# Patient Record
Sex: Female | Born: 1945
Health system: Southern US, Community
[De-identification: ages and names within clinical notes are randomized; demographics above are authoritative.]

## PROBLEM LIST (undated history)

## (undated) DIAGNOSIS — G4730 Sleep apnea, unspecified: Secondary | ICD-10-CM

## (undated) DIAGNOSIS — M1990 Unspecified osteoarthritis, unspecified site: Secondary | ICD-10-CM

## (undated) DIAGNOSIS — K2270 Barrett's esophagus without dysplasia: Secondary | ICD-10-CM

## (undated) DIAGNOSIS — M779 Enthesopathy, unspecified: Secondary | ICD-10-CM

## (undated) DIAGNOSIS — E7800 Pure hypercholesterolemia, unspecified: Secondary | ICD-10-CM

## (undated) DIAGNOSIS — N951 Menopausal and female climacteric states: Secondary | ICD-10-CM

## (undated) DIAGNOSIS — I10 Essential (primary) hypertension: Secondary | ICD-10-CM

## (undated) DIAGNOSIS — K219 Gastro-esophageal reflux disease without esophagitis: Secondary | ICD-10-CM

## (undated) DIAGNOSIS — E119 Type 2 diabetes mellitus without complications: Secondary | ICD-10-CM

## (undated) DIAGNOSIS — M8580 Other specified disorders of bone density and structure, unspecified site: Secondary | ICD-10-CM

## (undated) DIAGNOSIS — K5790 Diverticulosis of intestine, part unspecified, without perforation or abscess without bleeding: Secondary | ICD-10-CM

## (undated) DIAGNOSIS — E039 Hypothyroidism, unspecified: Secondary | ICD-10-CM

## (undated) HISTORY — PX: BREAST EXCISIONAL BIOPSY: SUR124

## (undated) HISTORY — PX: EYE SURGERY: SHX253

## (undated) HISTORY — DX: Essential (primary) hypertension: I10

## (undated) HISTORY — DX: Gastro-esophageal reflux disease without esophagitis: K21.9

## (undated) HISTORY — PX: OTHER SURGICAL HISTORY: SHX169

## (undated) HISTORY — PX: BREAST SURGERY: SHX581

## (undated) HISTORY — DX: Menopausal and female climacteric states: N95.1

## (undated) HISTORY — DX: Diverticulosis of intestine, part unspecified, without perforation or abscess without bleeding: K57.90

## (undated) HISTORY — PX: CATARACT EXTRACTION: SUR2

## (undated) HISTORY — DX: Sleep apnea, unspecified: G47.30

## (undated) HISTORY — DX: Other specified disorders of bone density and structure, unspecified site: M85.80

## (undated) HISTORY — DX: Enthesopathy, unspecified: M77.9

## (undated) HISTORY — PX: TONSILLECTOMY: SUR1361

## (undated) HISTORY — DX: Hypothyroidism, unspecified: E03.9

---

## 1981-07-06 HISTORY — PX: ABDOMINAL HYSTERECTOMY: SHX81

## 1992-07-06 HISTORY — PX: BREAST SURGERY: SHX581

## 2000-09-07 ENCOUNTER — Ambulatory Visit (HOSPITAL_BASED_OUTPATIENT_CLINIC_OR_DEPARTMENT_OTHER): Admission: RE | Admit: 2000-09-07 | Discharge: 2000-09-07 | Payer: Self-pay | Admitting: Orthopedic Surgery

## 2000-09-07 ENCOUNTER — Encounter (INDEPENDENT_AMBULATORY_CARE_PROVIDER_SITE_OTHER): Payer: Self-pay | Admitting: *Deleted

## 2001-10-19 ENCOUNTER — Encounter: Payer: Self-pay | Admitting: Obstetrics & Gynecology

## 2001-10-19 ENCOUNTER — Encounter: Admission: RE | Admit: 2001-10-19 | Discharge: 2001-10-19 | Payer: Self-pay | Admitting: Obstetrics & Gynecology

## 2002-11-28 ENCOUNTER — Encounter: Payer: Self-pay | Admitting: Obstetrics & Gynecology

## 2002-11-28 ENCOUNTER — Encounter: Admission: RE | Admit: 2002-11-28 | Discharge: 2002-11-28 | Payer: Self-pay | Admitting: Obstetrics & Gynecology

## 2003-12-05 ENCOUNTER — Encounter: Admission: RE | Admit: 2003-12-05 | Discharge: 2003-12-05 | Payer: Self-pay | Admitting: Obstetrics & Gynecology

## 2004-08-13 ENCOUNTER — Ambulatory Visit: Payer: Self-pay | Admitting: Family Medicine

## 2004-10-13 ENCOUNTER — Ambulatory Visit: Payer: Self-pay | Admitting: Family Medicine

## 2004-10-21 ENCOUNTER — Ambulatory Visit: Payer: Self-pay | Admitting: Family Medicine

## 2005-01-22 ENCOUNTER — Ambulatory Visit: Payer: Self-pay | Admitting: Family Medicine

## 2005-02-06 ENCOUNTER — Ambulatory Visit: Payer: Self-pay | Admitting: Family Medicine

## 2005-04-06 ENCOUNTER — Encounter: Admission: RE | Admit: 2005-04-06 | Discharge: 2005-04-06 | Payer: Self-pay | Admitting: Obstetrics & Gynecology

## 2005-04-16 ENCOUNTER — Encounter: Admission: RE | Admit: 2005-04-16 | Discharge: 2005-04-16 | Payer: Self-pay | Admitting: Obstetrics & Gynecology

## 2005-07-06 DIAGNOSIS — G4730 Sleep apnea, unspecified: Secondary | ICD-10-CM

## 2005-07-06 HISTORY — DX: Sleep apnea, unspecified: G47.30

## 2005-08-27 ENCOUNTER — Ambulatory Visit: Payer: Self-pay | Admitting: Family Medicine

## 2005-09-15 ENCOUNTER — Ambulatory Visit: Payer: Self-pay | Admitting: Family Medicine

## 2005-10-08 ENCOUNTER — Encounter: Admission: RE | Admit: 2005-10-08 | Discharge: 2005-10-08 | Payer: Self-pay | Admitting: Obstetrics & Gynecology

## 2005-12-23 ENCOUNTER — Ambulatory Visit: Payer: Self-pay | Admitting: Family Medicine

## 2006-01-18 ENCOUNTER — Ambulatory Visit (HOSPITAL_BASED_OUTPATIENT_CLINIC_OR_DEPARTMENT_OTHER): Admission: RE | Admit: 2006-01-18 | Discharge: 2006-01-18 | Payer: Self-pay | Admitting: Family Medicine

## 2006-01-29 ENCOUNTER — Ambulatory Visit: Payer: Self-pay | Admitting: Pulmonary Disease

## 2006-03-23 ENCOUNTER — Ambulatory Visit: Payer: Self-pay | Admitting: Pulmonary Disease

## 2006-04-07 ENCOUNTER — Encounter: Admission: RE | Admit: 2006-04-07 | Discharge: 2006-04-07 | Payer: Self-pay | Admitting: Obstetrics & Gynecology

## 2006-04-13 ENCOUNTER — Ambulatory Visit: Payer: Self-pay | Admitting: Family Medicine

## 2006-07-01 ENCOUNTER — Ambulatory Visit: Payer: Self-pay | Admitting: Family Medicine

## 2006-07-27 ENCOUNTER — Ambulatory Visit: Payer: Self-pay | Admitting: Family Medicine

## 2006-10-05 ENCOUNTER — Ambulatory Visit: Payer: Self-pay | Admitting: Family Medicine

## 2006-10-29 ENCOUNTER — Encounter: Payer: Self-pay | Admitting: Family Medicine

## 2006-10-29 DIAGNOSIS — I10 Essential (primary) hypertension: Secondary | ICD-10-CM | POA: Insufficient documentation

## 2006-10-29 DIAGNOSIS — E063 Autoimmune thyroiditis: Secondary | ICD-10-CM

## 2006-10-29 DIAGNOSIS — E038 Other specified hypothyroidism: Secondary | ICD-10-CM

## 2006-10-29 DIAGNOSIS — K219 Gastro-esophageal reflux disease without esophagitis: Secondary | ICD-10-CM

## 2006-10-29 DIAGNOSIS — G4730 Sleep apnea, unspecified: Secondary | ICD-10-CM | POA: Insufficient documentation

## 2006-10-30 ENCOUNTER — Encounter: Payer: Self-pay | Admitting: Family Medicine

## 2006-11-08 ENCOUNTER — Telehealth: Payer: Self-pay | Admitting: Family Medicine

## 2006-12-03 ENCOUNTER — Ambulatory Visit: Payer: Self-pay | Admitting: Family Medicine

## 2006-12-06 ENCOUNTER — Telehealth: Payer: Self-pay | Admitting: Family Medicine

## 2006-12-08 LAB — CONVERTED CEMR LAB
BUN: 15 mg/dL (ref 6–23)
CO2: 25 meq/L (ref 19–32)
Calcium: 9.7 mg/dL (ref 8.4–10.5)
Chloride: 102 meq/L (ref 96–112)
Creatinine, Ser: 0.87 mg/dL (ref 0.40–1.20)
Glucose, Bld: 83 mg/dL (ref 70–99)
Potassium: 3.8 meq/L (ref 3.5–5.3)
Sodium: 140 meq/L (ref 135–145)

## 2006-12-14 ENCOUNTER — Telehealth (INDEPENDENT_AMBULATORY_CARE_PROVIDER_SITE_OTHER): Payer: Self-pay | Admitting: *Deleted

## 2006-12-17 ENCOUNTER — Ambulatory Visit: Payer: Self-pay | Admitting: Family Medicine

## 2006-12-20 ENCOUNTER — Telehealth: Payer: Self-pay | Admitting: Family Medicine

## 2006-12-25 ENCOUNTER — Encounter: Payer: Self-pay | Admitting: Family Medicine

## 2007-04-19 ENCOUNTER — Encounter: Admission: RE | Admit: 2007-04-19 | Discharge: 2007-04-19 | Payer: Self-pay | Admitting: Obstetrics & Gynecology

## 2007-05-31 ENCOUNTER — Encounter: Payer: Self-pay | Admitting: Family Medicine

## 2007-06-17 ENCOUNTER — Encounter: Payer: Self-pay | Admitting: Family Medicine

## 2007-06-17 ENCOUNTER — Encounter (INDEPENDENT_AMBULATORY_CARE_PROVIDER_SITE_OTHER): Payer: Self-pay | Admitting: Orthopedic Surgery

## 2007-06-17 ENCOUNTER — Ambulatory Visit (HOSPITAL_BASED_OUTPATIENT_CLINIC_OR_DEPARTMENT_OTHER): Admission: RE | Admit: 2007-06-17 | Discharge: 2007-06-17 | Payer: Self-pay | Admitting: Orthopedic Surgery

## 2007-08-05 ENCOUNTER — Ambulatory Visit: Payer: Self-pay | Admitting: Family Medicine

## 2007-08-08 LAB — CONVERTED CEMR LAB
ALT: 21 units/L (ref 0–35)
AST: 23 units/L (ref 0–37)
Basophils Relative: 0 % (ref 0.0–1.0)
Bilirubin, Direct: 0.1 mg/dL (ref 0.0–0.3)
CO2: 31 meq/L (ref 19–32)
Calcium: 9.6 mg/dL (ref 8.4–10.5)
Creatinine, Ser: 0.9 mg/dL (ref 0.4–1.2)
Eosinophils Relative: 2 % (ref 0.0–5.0)
GFR calc Af Amer: 82 mL/min
Glucose, Bld: 89 mg/dL (ref 70–99)
Hemoglobin: 13.1 g/dL (ref 12.0–15.0)
Lymphocytes Relative: 40.7 % (ref 12.0–46.0)
Neutro Abs: 3.6 10*3/uL (ref 1.4–7.7)
Platelets: 283 10*3/uL (ref 150–400)
Total Bilirubin: 0.7 mg/dL (ref 0.3–1.2)
Total Protein: 6.9 g/dL (ref 6.0–8.3)
Triglycerides: 113 mg/dL (ref 0–149)
VLDL: 23 mg/dL (ref 0–40)
WBC: 7.6 10*3/uL (ref 4.5–10.5)

## 2007-10-25 ENCOUNTER — Telehealth: Payer: Self-pay | Admitting: Family Medicine

## 2007-11-04 ENCOUNTER — Encounter: Payer: Self-pay | Admitting: Family Medicine

## 2007-12-22 ENCOUNTER — Ambulatory Visit: Payer: Self-pay | Admitting: Family Medicine

## 2007-12-28 ENCOUNTER — Encounter: Payer: Self-pay | Admitting: Family Medicine

## 2008-03-05 ENCOUNTER — Telehealth (INDEPENDENT_AMBULATORY_CARE_PROVIDER_SITE_OTHER): Payer: Self-pay | Admitting: *Deleted

## 2008-03-06 HISTORY — PX: ESOPHAGOGASTRODUODENOSCOPY: SHX1529

## 2008-03-07 ENCOUNTER — Telehealth: Payer: Self-pay | Admitting: Gastroenterology

## 2008-03-07 ENCOUNTER — Ambulatory Visit: Payer: Self-pay | Admitting: Gastroenterology

## 2008-03-19 ENCOUNTER — Ambulatory Visit: Payer: Self-pay | Admitting: Gastroenterology

## 2008-03-19 ENCOUNTER — Encounter: Payer: Self-pay | Admitting: Gastroenterology

## 2008-03-20 ENCOUNTER — Encounter: Payer: Self-pay | Admitting: Gastroenterology

## 2008-04-03 ENCOUNTER — Ambulatory Visit: Payer: Self-pay | Admitting: Family Medicine

## 2008-04-24 ENCOUNTER — Encounter: Admission: RE | Admit: 2008-04-24 | Discharge: 2008-04-24 | Payer: Self-pay | Admitting: Obstetrics & Gynecology

## 2008-05-09 ENCOUNTER — Ambulatory Visit: Payer: Self-pay | Admitting: Gastroenterology

## 2008-06-18 ENCOUNTER — Encounter: Payer: Self-pay | Admitting: Family Medicine

## 2008-08-17 ENCOUNTER — Telehealth: Payer: Self-pay | Admitting: Family Medicine

## 2008-10-08 ENCOUNTER — Ambulatory Visit: Payer: Self-pay | Admitting: Family Medicine

## 2008-10-08 DIAGNOSIS — M8580 Other specified disorders of bone density and structure, unspecified site: Secondary | ICD-10-CM

## 2008-10-10 LAB — CONVERTED CEMR LAB
ALT: 17 units/L (ref 0–35)
AST: 22 units/L (ref 0–37)
Albumin: 4.3 g/dL (ref 3.5–5.2)
Alkaline Phosphatase: 41 units/L (ref 39–117)
BUN: 12 mg/dL (ref 6–23)
Basophils Absolute: 0 10*3/uL (ref 0.0–0.1)
Basophils Relative: 0 % (ref 0.0–3.0)
Bilirubin, Direct: 0.1 mg/dL (ref 0.0–0.3)
CO2: 29 meq/L (ref 19–32)
Calcium: 9.5 mg/dL (ref 8.4–10.5)
Chloride: 102 meq/L (ref 96–112)
Cholesterol: 168 mg/dL (ref 0–200)
Creatinine, Ser: 0.9 mg/dL (ref 0.4–1.2)
Eosinophils Absolute: 0.1 10*3/uL (ref 0.0–0.7)
Eosinophils Relative: 2 % (ref 0.0–5.0)
Free T4: 1.4 ng/dL (ref 0.6–1.6)
GFR calc non Af Amer: 67.24 mL/min (ref >60)
Glucose, Bld: 80 mg/dL (ref 70–99)
HCT: 40.6 % (ref 36.0–46.0)
HDL: 42.8 mg/dL (ref >39.00)
Hemoglobin: 13.9 g/dL (ref 12.0–15.0)
LDL Cholesterol: 112 mg/dL — ABNORMAL HIGH (ref 0–99)
Lymphocytes Relative: 37.7 % (ref 12.0–46.0)
Lymphs Abs: 2.2 10*3/uL (ref 0.7–4.0)
MCHC: 34.3 g/dL (ref 30.0–36.0)
MCV: 85.8 fL (ref 78.0–100.0)
Monocytes Absolute: 0.5 10*3/uL (ref 0.1–1.0)
Monocytes Relative: 8.5 % (ref 3.0–12.0)
Neutro Abs: 3 10*3/uL (ref 1.4–7.7)
Neutrophils Relative %: 51.8 % (ref 43.0–77.0)
Platelets: 266 10*3/uL (ref 150.0–400.0)
Potassium: 4.3 meq/L (ref 3.5–5.1)
RBC: 4.73 M/uL (ref 3.87–5.11)
RDW: 13.5 % (ref 11.5–14.6)
Sodium: 138 meq/L (ref 135–145)
TSH: 0.12 microintl units/mL — ABNORMAL LOW (ref 0.35–5.50)
Total Bilirubin: 0.6 mg/dL (ref 0.3–1.2)
Total CHOL/HDL Ratio: 4
Total Protein: 6.9 g/dL (ref 6.0–8.3)
Triglycerides: 68 mg/dL (ref 0.0–149.0)
VLDL: 13.6 mg/dL (ref 0.0–40.0)
Vit D, 25-Hydroxy: 38 ng/mL (ref 30–89)
WBC: 5.8 10*3/uL (ref 4.5–10.5)

## 2008-10-16 ENCOUNTER — Encounter: Payer: Self-pay | Admitting: Family Medicine

## 2008-10-29 ENCOUNTER — Ambulatory Visit: Payer: Self-pay | Admitting: Family Medicine

## 2008-10-29 DIAGNOSIS — L820 Inflamed seborrheic keratosis: Secondary | ICD-10-CM

## 2008-11-03 LAB — HM DEXA SCAN

## 2009-02-05 ENCOUNTER — Ambulatory Visit: Payer: Self-pay | Admitting: Family Medicine

## 2009-03-08 ENCOUNTER — Telehealth: Payer: Self-pay | Admitting: Family Medicine

## 2009-03-08 ENCOUNTER — Encounter: Payer: Self-pay | Admitting: Family Medicine

## 2009-04-26 ENCOUNTER — Encounter: Admission: RE | Admit: 2009-04-26 | Discharge: 2009-04-26 | Payer: Self-pay | Admitting: Family Medicine

## 2009-04-30 ENCOUNTER — Encounter (INDEPENDENT_AMBULATORY_CARE_PROVIDER_SITE_OTHER): Payer: Self-pay | Admitting: *Deleted

## 2009-06-21 ENCOUNTER — Ambulatory Visit: Payer: Self-pay | Admitting: Family Medicine

## 2009-08-13 ENCOUNTER — Ambulatory Visit: Payer: Self-pay | Admitting: Family Medicine

## 2009-09-11 ENCOUNTER — Ambulatory Visit: Payer: Self-pay | Admitting: Family Medicine

## 2009-11-06 ENCOUNTER — Ambulatory Visit: Payer: Self-pay | Admitting: Family Medicine

## 2009-11-07 ENCOUNTER — Ambulatory Visit: Payer: Self-pay | Admitting: Family Medicine

## 2009-11-07 DIAGNOSIS — L259 Unspecified contact dermatitis, unspecified cause: Secondary | ICD-10-CM | POA: Insufficient documentation

## 2009-11-07 DIAGNOSIS — K6289 Other specified diseases of anus and rectum: Secondary | ICD-10-CM

## 2009-11-07 LAB — CONVERTED CEMR LAB
ALT: 19 units/L (ref 0–35)
AST: 27 units/L (ref 0–37)
Albumin: 3.9 g/dL (ref 3.5–5.2)
Alkaline Phosphatase: 51 units/L (ref 39–117)
Basophils Absolute: 0.1 10*3/uL (ref 0.0–0.1)
Basophils Relative: 0.9 % (ref 0.0–3.0)
Calcium: 8.9 mg/dL (ref 8.4–10.5)
Eosinophils Relative: 2.6 % (ref 0.0–5.0)
GFR calc non Af Amer: 67.01 mL/min (ref >60)
Glucose, Bld: 114 mg/dL — ABNORMAL HIGH (ref 70–99)
HCT: 39.7 % (ref 36.0–46.0)
HDL: 50.9 mg/dL (ref >39.00)
Hemoglobin: 13.3 g/dL (ref 12.0–15.0)
Lymphocytes Relative: 40.9 % (ref 12.0–46.0)
Lymphs Abs: 2.4 10*3/uL (ref 0.7–4.0)
Monocytes Relative: 9.2 % (ref 3.0–12.0)
Neutro Abs: 2.7 10*3/uL (ref 1.4–7.7)
Potassium: 4.2 meq/L (ref 3.5–5.1)
RBC: 4.58 M/uL (ref 3.87–5.11)
RDW: 14.6 % (ref 11.5–14.6)
Sodium: 143 meq/L (ref 135–145)
TSH: 5.07 microintl units/mL (ref 0.35–5.50)
Total CHOL/HDL Ratio: 4
Total Protein: 6.3 g/dL (ref 6.0–8.3)
Triglycerides: 76 mg/dL (ref 0.0–149.0)

## 2009-11-12 ENCOUNTER — Encounter: Admission: RE | Admit: 2009-11-12 | Discharge: 2009-11-12 | Payer: Self-pay | Admitting: Family Medicine

## 2009-11-20 ENCOUNTER — Telehealth: Payer: Self-pay | Admitting: Family Medicine

## 2009-11-22 ENCOUNTER — Ambulatory Visit: Payer: Self-pay | Admitting: Family Medicine

## 2009-12-04 ENCOUNTER — Ambulatory Visit: Payer: Self-pay | Admitting: Gastroenterology

## 2009-12-06 ENCOUNTER — Telehealth: Payer: Self-pay | Admitting: Gastroenterology

## 2010-01-28 ENCOUNTER — Ambulatory Visit: Payer: Self-pay | Admitting: Gastroenterology

## 2010-02-05 ENCOUNTER — Ambulatory Visit: Payer: Self-pay | Admitting: Family Medicine

## 2010-02-05 DIAGNOSIS — E7800 Pure hypercholesterolemia, unspecified: Secondary | ICD-10-CM

## 2010-02-05 LAB — CONVERTED CEMR LAB
ALT: 18 U/L (ref 0–35)
AST: 20 U/L (ref 0–37)
Cholesterol: 196 mg/dL (ref 0–200)
HDL: 49.5 mg/dL (ref >39.00)
LDL Cholesterol: 130 mg/dL — ABNORMAL HIGH (ref 0–99)
Total CHOL/HDL Ratio: 4
Triglycerides: 84 mg/dL (ref 0.0–149.0)
VLDL: 16.8 mg/dL (ref 0.0–40.0)

## 2010-02-10 ENCOUNTER — Ambulatory Visit: Payer: Self-pay | Admitting: Family Medicine

## 2010-02-27 ENCOUNTER — Ambulatory Visit: Payer: Self-pay | Admitting: Pulmonary Disease

## 2010-02-27 DIAGNOSIS — G4733 Obstructive sleep apnea (adult) (pediatric): Secondary | ICD-10-CM | POA: Insufficient documentation

## 2010-02-28 ENCOUNTER — Encounter: Payer: Self-pay | Admitting: Family Medicine

## 2010-04-22 ENCOUNTER — Ambulatory Visit: Payer: Self-pay | Admitting: Internal Medicine

## 2010-04-24 ENCOUNTER — Telehealth: Payer: Self-pay | Admitting: Family Medicine

## 2010-04-29 ENCOUNTER — Encounter: Admission: RE | Admit: 2010-04-29 | Discharge: 2010-04-29 | Payer: Self-pay | Admitting: Family Medicine

## 2010-04-29 LAB — HM MAMMOGRAPHY: HM Mammogram: NORMAL

## 2010-05-02 ENCOUNTER — Encounter: Payer: Self-pay | Admitting: Family Medicine

## 2010-06-20 ENCOUNTER — Ambulatory Visit: Payer: Self-pay | Admitting: Pulmonary Disease

## 2010-08-07 NOTE — Assessment & Plan Note (Signed)
Summary: ? SPIDER BITE   Vital Signs:  Patient profile:   65 year old female Height:      65.25 inches Weight:      178.25 pounds BMI:     29.54 Temp:     98.1 degrees F oral Pulse rate:   64 / minute Pulse rhythm:   regular BP sitting:   112 / 70  (left arm) Cuff size:   regular  Vitals Entered By: Benny Lennert CMA Duncan Dull) (September 11, 2009 3:57 PM) CC: ? spider bite   History of Present Illness: 65 year old female:  L leg abcess, resolving.   the patient had left heart read indurated area on the left posterior aspect of her leg. This started approximately 10 days ago, he was warm to touch originally, and this is now resolved and improved. He continues to be indurated.  She has not had any pus, or be expressed from this area. She questions whether or not this was from a spider bite. She has no known history of any pain at that time when this came on, and there was no puncture wound evident.   ROS: GEN: No acute illnesses, no fevers, chills, sweats, fatigue, weight loss, or URI sx. GI: No n/v/d Pulm: No SOB, cough, wheezing Interactive and getting along well at home.  Otherwise, ROS is as per the HPI.   Allergies (verified): No Known Drug Allergies  Past History:  Past medical, surgical, family and social histories (including risk factors) reviewed, and no changes noted (except as noted below).  Past Medical History: Reviewed history from 02/05/2009 and no changes required. Hypertension Hypothyroidism diverticulosis GERD/esophagitis with ulceration osteopenia  bone spur- L foot - posterior menopausal symptoms /hot flashes  GI-- Dr Christella Hartigan  endo - March Rummage PA- Dr Katrinka Blazing ([facility]) GYN- Dr Arlyce Dice  podiatry [facility]   Past Surgical History: Reviewed history from 10/08/2008 and no changes required. Hysterectomy- partial, endometriosis (1983) Lumpectomy- breast, benign (1970's) Tonsillectomy Breast implants- saline (1994) Nodule removed from  thumb Caesarean section X 2 (1971, 1975) Colonoscopy- diverticulosis (05/2000) colonoscopy - nl with neg bx (9/09) EGD esophagitis with ulcerations --? barretts (9/09) screen [place] for AAA- negative 3/10  carotid doppler screen neg 3/10  heel dexa T score -1.0  Family History: Reviewed history from 10/08/2008 and no changes required. father lung ca mother HTN, colon polyps, chol, AAA, heart problems , migraines  brother migriane  Maunt breast ca No FH of Colon Cancer:  Social History: Reviewed history from 10/08/2008 and no changes required. Never Smoked she is an Public affairs consultant resources at [employer], she has 2 children, she does not drink alcohol, is married helpint care for mother with AAA surgery   Physical Exam  General:  Well-developed,well-nourished,in no acute distress; alert,appropriate and cooperative throughout examination Head:  Normocephalic and atraumatic without obvious abnormalities. No apparent alopecia or balding. Ears:  no external deformities.   Nose:  no external deformity.   Neck:  No deformities, masses, or tenderness noted. Lungs:  Normal respiratory effort, chest expands symmetrically. Lungs are clear to auscultation, no crackles or wheezes. Extremities:  No clubbing, cyanosis, edema, or deformity noted with normal full range of motion of all joints.   Neurologic:  alert & oriented X3 and gait normal.   Skin:  posterior left leg, approximately 3 cm across there is an area of some induration without any fluctuance. It is minimally tender to palpation, and it is not warm. There is no central  fluctuance. Psych:  Cognition and judgment appear intact. Alert and cooperative with normal attention span and concentration. No apparent delusions, illusions, hallucinations   Impression & Recommendations:  Problem # 1:  ABSCESS, LEG (ICD-682.6) I think the patient most likely has a left posterior leg cellulitis, abscess, that is now resolving. I think it  is certainly possible that the patient has encapsulated this, and it certainly has improved over last few days.  I feel like I'm obligated to treat this with some oral antibiotics, and we discussed that this could long-standing have some scar and palpable change in this area in her leg  Her updated medication list for this problem includes:    Doxycycline Hyclate 100 Mg Caps (Doxycycline hyclate) .Marland Kitchen... Take 1 tab twice a day  Complete Medication List: 1)  Synthroid 88 Mcg Tabs (Levothyroxine sodium) .... Take 1 tablet by mouth once a day and 1 1/2 tab once weekly 2)  Hydrochlorothiazide 25 Mg Tabs (Hydrochlorothiazide) .Marland Kitchen.. 1 by mouth once daily 3)  Daily Combo Multivits/calcium Tabs (Multiple vitamins-calcium) .Marland Kitchen.. 1 by mouth once daily 4)  Omeprazole 20 Mg Cpdr (Omeprazole) .... One tab by mouth daily 5)  Doxycycline Hyclate 100 Mg Caps (Doxycycline hyclate) .... Take 1 tab twice a day Prescriptions: DOXYCYCLINE HYCLATE 100 MG CAPS (DOXYCYCLINE HYCLATE) Take 1 tab twice a day  #20 x 0   Entered and Authorized by:   Hannah Beat MD   Signed by:   Hannah Beat MD on 09/11/2009   Method used:   [facility]        [facility]  [address] [facility]       [address]       [place], [place]  [address]       Ph: [phone] or [phone]       Fax: [phone]   RxID:   [id number]   Current Allergies (reviewed today): No known allergies

## 2010-08-07 NOTE — Assessment & Plan Note (Signed)
Summary: rov for osa   Visit Type:  Follow-up Copy to:  [name], M.D. Primary Provider/Referring Provider:  [name], M.D.  CC:  follow up. pt states she uses her cpap everynight x 3 hrs a night. pt states she is having no problems with her machine. Pt states since she has changer her mask she is able to use it more.  pt c/o nasal congestion at night.  History of Present Illness: the pt comes in today for f/u of her osa.  She was started on cpap at the last visit, and is trying to get adapted to the device.  She started out with a full fm, but now has changed to nasal pillows.  She still has issues with sleeping on her side, with dislodging of mask at times.  She is not wearing thru the night, but is willing to keep trying to increase compliance.  She is not able to comment on symptom improvement as of yet.    Current Medications (verified): 1)  Synthroid 88 Mcg Tabs (Levothyroxine Sodium) .... Take 1 Tablet By Mouth Once A Day 2)  Hydrochlorothiazide 25 Mg Tabs (Hydrochlorothiazide) .Marland Kitchen.. 1 By Mouth Once Daily 3)  Daily Combo Multivits/calcium  Tabs (Multiple Vitamins-Calcium) .Marland Kitchen.. 1 By Mouth Once Daily 4)  Omeprazole 20 Mg  Cpdr (Omeprazole) .... One Tab By Mouth Daily 5)  Multivitamins  Tabs (Multiple Vitamin) .... Take One By Mouth Once Daily 6)  Viactive .Marland Kitchen.. 1 By Mouth Three Times A Day  Allergies (verified): No Known Drug Allergies  Past History:  Past medical, surgical, family and social histories (including risk factors) reviewed, and no changes noted (except as noted below).  Past Medical History: Reviewed history from 02/27/2010 and no changes required. Hypertension Hypothyroidism diverticulosis GERD/esophagitis with ulceration osteopenia  bone spur- L foot - posterior menopausal symptoms /hot flashes sleep apnea - AHI 45/hr in 2007.  GI-- Dr Christella Hartigan  endo - March Rummage PA- Dr Katrinka Blazing ([facility]) GYN- Dr Arlyce Dice  podiatry [facility]   Past Surgical  History: Reviewed history from 02/27/2010 and no changes required. Hysterectomy- partial, endometriosis (1983) Lumpectomy- breast, benign (1970's) Tonsillectomy Breast implants- saline (1994) Nodule removed from thumb Colonoscopy- diverticulosis (05/2000) colonoscopy - nl with neg bx (9/09) EGD esophagitis with ulcerations --? barretts (9/09) screen [place] for AAA- negative 3/10  carotid doppler screen neg 3/10  heel dexa T score -1.0  Family History: Reviewed history from 01/28/2010 and no changes required. father lung ca mother HTN, colon polyps, chol, AAA, heart problems , migraines , cholesterol  brother migriane  Maunt breast ca No FH of Colon Cancer  Social History: Reviewed history from 10/08/2008 and no changes required. 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  The patient denies shortness of breath with activity, shortness of breath at rest, productive cough, non-productive cough, coughing up blood, chest pain, irregular heartbeats, loss of appetite, abdominal pain, difficulty swallowing, sore throat, tooth/dental problems, headaches, sneezing, itching, ear ache, anxiety, depression, hand/feet swelling, joint stiffness or pain, rash, change in color of mucus, and fever.    Vital Signs:  Patient profile:   65 year old female Height:      66 inches Weight:      189.13 pounds  BMI:     30.64 O2 Sat:      97 % on Room air Temp:     98.3 degrees F oral Pulse rate:   86 / minute BP sitting:   122 / 76  (left arm) Cuff size:   large  Vitals Entered By: Carver Fila (June 20, 2010 10:18 AM)  O2 Flow:  Room air CC: follow up. pt states she uses her cpap everynight x 3 hrs a night. pt states she is having no problems with  her machine. Pt states since she has changer her mask she is able to use it more.  pt c/o nasal congestion at night Comments meds and allergies updated Phone number updated Carver Fila  June 20, 2010 10:19 AM    Physical Exam  General:  ow female in nad Nose:  no purulence or discharge, no skin breakdown or pressure necrosis from cpap mask Heart:  rrr Extremities:  no edema noted, no cyanosis  Neurologic:  alert, does not appear sleepy, moves all 4.   Impression & Recommendations:  Problem # 1:  OBSTRUCTIVE SLEEP APNEA (ICD-327.23) the pt is trying to wear cpap compliantly, but is having ongoing mask issues.  She is now using nasal pillows, and is doing better.  She still is not getting completely thru the night with the device, but is willing to keep trying.  The jury is still out regarding her sleep quality and daytime alertness.  We still need to optimize pressure for her, and I have also encouraged her to work on weight loss. Care Plan:  At this point, will arrange for the patient's machine to be changed over to auto mode for 2 weeks to optimize their pressure.  I will review the downloaded data once sent by dme, and also evaluate for compliance, leaks, and residual osa.  I will call the patient and dme to discuss the results, and have the patient's machine set appropriately.  This will serve as the pt's cpap pressure titration.  Other Orders: Est. Patient Level IV (09811) DME Referral (DME)  Patient Instructions: 1)  will turn machine to auto mode to optimize your pressure.  Will get download and let you know the results.  Let us know if you prefer the auto setting 2)  work on weight loss 3)  followup up with me in 6mos, but call if having ongoing tolerance issues.

## 2010-08-07 NOTE — Assessment & Plan Note (Signed)
Summary: ? SINUS INFECTION   Vital Signs:  Patient profile:   65 year old female Height:      65.25 inches Weight:      180.13 pounds BMI:     29.85 Temp:     98.3 degrees F oral Pulse rate:   76 / minute Pulse rhythm:   regular BP sitting:   120 / 86  (left arm) Cuff size:   regular  Vitals Entered By: Delilah Shan CMA Duncan Dull) (August 13, 2009 10:47 AM) CC: ? sinus infection   History of Present Illness: 65 yo with 1 week of nasal congestion, cough, hoarseness. Taking Cheratussin as needed at night.   HA is frontal. Subjective fevers, chills. No sore throat, n/v/d, rash. Ear pain. Multiple sick contacts at work with similar symptoms.  Current Medications (verified): 1)  Synthroid 88 Mcg Tabs (Levothyroxine Sodium) .... Take 1 Tablet By Mouth Once A Day and 1 1/2 Tab Once Weekly 2)  Hydrochlorothiazide 25 Mg Tabs (Hydrochlorothiazide) .Marland Kitchen.. 1 By Mouth Once Daily 3)  Daily Combo Multivits/calcium  Tabs (Multiple Vitamins-Calcium) .Marland Kitchen.. 1 By Mouth Once Daily 4)  Omeprazole 20 Mg  Cpdr (Omeprazole) .... One Tab By Mouth Daily 5)  Cheratussin Ac 100-10 Mg/41ml Syrp (Guaifenesin-Codeine) .... 5 Ml At Bedtime As Needed For Cough. 6)  Azithromycin 250 Mg  Tabs (Azithromycin) .... 2 By  Mouth Today and Then 1 Daily For 4 Days  Allergies (verified): No Known Drug Allergies  Review of Systems      See HPI General:  Complains of chills and fever. ENT:  Complains of earache, postnasal drainage, sinus pressure, and sore throat; denies difficulty swallowing and ear discharge. CV:  Denies chest pain or discomfort. Resp:  Complains of cough and sputum productive; denies shortness of breath and wheezing. GI:  Denies abdominal pain, nausea, and vomiting.  Physical Exam  General:  Well-developed,well-nourished,in no acute distress; alert,appropriate and cooperative throughout examination VS reviewed- afebrile and normotensive. Ears:  mild fluid bilaterally. Nose:  mucosal erythema.     TTP over frontal sinuses, bilaterally. Mouth:  pharyngeal erythema.   Lungs:  Normal respiratory effort, chest expands symmetrically. Lungs are clear to auscultation, no crackles or wheezes. Heart:  Normal rate and regular rhythm. S1 and S2 normal without gallop, murmur, click, rub or other extra sounds. Psych:  normal affect, talkative and pleasant    Impression & Recommendations:  Problem # 1:  ACUTE FRONTAL SINUSITIS (ICD-461.1) Assessment New Given duration and progression of symptoms, will treat for bacterial sinusitis. Zpack. See pt instructions for details. Her updated medication list for this problem includes:    Cheratussin Ac 100-10 Mg/84ml Syrp (Guaifenesin-codeine) .Marland KitchenMarland KitchenMarland KitchenMarland Kitchen 5 ml at bedtime as needed for cough.    Azithromycin 250 Mg Tabs (Azithromycin) .Marland Kitchen... 2 by  mouth today and then 1 daily for 4 days  Complete Medication List: 1)  Synthroid 88 Mcg Tabs (Levothyroxine sodium) .... Take 1 tablet by mouth once a day and 1 1/2 tab once weekly 2)  Hydrochlorothiazide 25 Mg Tabs (Hydrochlorothiazide) .Marland Kitchen.. 1 by mouth once daily 3)  Daily Combo Multivits/calcium Tabs (Multiple vitamins-calcium) .Marland Kitchen.. 1 by mouth once daily 4)  Omeprazole 20 Mg Cpdr (Omeprazole) .... One tab by mouth daily 5)  Cheratussin Ac 100-10 Mg/27ml Syrp (Guaifenesin-codeine) .... 5 ml at bedtime as needed for cough. 6)  Azithromycin 250 Mg Tabs (Azithromycin) .... 2 by  mouth today and then 1 daily for 4 days  Patient Instructions: 1)  Take antibiotic as directed.  Drink lots of fluids.  Treat sympotmatically with Mucinex, nasal saline irrigation, and Tylenol/Ibuprofen.  Cough suppressant twice a day as needed. Call if not improving as expected in 5-7 days.  Prescriptions: AZITHROMYCIN 250 MG  TABS (AZITHROMYCIN) 2 by  mouth today and then 1 daily for 4 days  #6 x 0   Entered and Authorized by:   Ruthe Mannan MD   Signed by:   Ruthe Mannan MD on 08/13/2009   Method used:   [facility]        [facility]   [address] [facility]       [address]       [place], [place]  [address]       Ph: [phone] or [phone]       Fax: [phone]   RxID:   [id number]   Current Allergies (reviewed today): No known allergies

## 2010-08-07 NOTE — Assessment & Plan Note (Signed)
Summary: 3 month follow up/rbh   Vital Signs:  Patient profile:   65 year old female Height:      66.5 inches Weight:      184 pounds BMI:     29.36 Temp:     97.9 degrees F oral Pulse rate:   76 / minute Pulse rhythm:   regular BP sitting:   118 / 76  (left arm) Cuff size:   regular  Vitals Entered By: Lewanda Rife LPN (February 10, 2010 8:38 AM) CC: follow-up visit   History of Present Illness: here for f/u of lipids and osteopenia   lipids imp with trig 84 and HDL 49 and LDL 130 (down from 149) diet - further improved  exercised more and more fruit and vegetables  really avoids sat fats completely   dexa shows osteopenia just at spine -1.8 T score   has had sleep apnea in the past -- mod to severe  could not stand wearing the mask  is a mouth breather  had nasal surgery in past - does not breathe well out of it  is generally not sleeping well   ca and D on ppi -- saw Dr Christella Hartigan 2 weeks ago and disc this  has ulcerative esophagitis  no hx of blood clots  would be open to evista       Allergies (verified): No Known Drug Allergies  Past History:  Past Medical History: Last updated: 11/07/2009 Hypertension Hypothyroidism diverticulosis GERD/esophagitis with ulceration osteopenia  bone spur- L foot - posterior menopausal symptoms /hot flashes sleep apnea - non tolerant of cpap  GI-- Dr Christella Hartigan  endo - March Rummage PA- Dr Katrinka Blazing ([facility]) GYN- Dr Arlyce Dice  podiatry - [facility]   Past Surgical History: Last updated: 10/08/2008 Hysterectomy- partial, endometriosis (1983) Lumpectomy- breast, benign (1970's) Tonsillectomy Breast implants- saline (1994) Nodule removed from thumb Caesarean section X 2 (1971, 1975) Colonoscopy- diverticulosis (05/2000) colonoscopy - nl with neg bx (9/09) EGD esophagitis with ulcerations --? 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 Eyes:  Denies blurring and eye irritation. CV:  Denies chest pain or discomfort, lightheadness, palpitations, and shortness of breath with exertion. Resp:  Denies cough, shortness of breath, and wheezing. GI:  Denies indigestion, nausea, and vomiting. GU:  Denies dysuria and urinary frequency. MS:  Denies muscle aches, cramps, and muscle weakness. Derm:  Denies lesion(s), poor wound healing, and rash. Neuro:  Denies numbness and tingling. Endo:  Denies cold intolerance, excessive thirst, excessive urination, and heat intolerance. Heme:  Denies abnormal bruising and bleeding.  Physical Exam  General:  Well-developed,well-nourished,in no acute distress; alert,appropriate and cooperative throughout examination Head:  normocephalic, atraumatic, and no abnormalities observed.   Eyes:  vision grossly intact, pupils equal, pupils round, and pupils reactive to light.  no conjunctival pallor, injection or icterus  Mouth:  pharynx pink and moist.   Neck:  supple with  full rom and no masses or thyromegally, no JVD or carotid bruit  Lungs:  Normal respiratory effort, chest expands symmetrically. Lungs are clear to auscultation, no crackles or wheezes. Heart:  Normal rate and regular rhythm. S1 and S2 normal without gallop, murmur, click, rub or other extra sounds. Abdomen:  Bowel sounds positive,abdomen soft and non-tender without masses, organomegaly or hernias noted. no renal bruits    Msk:  No deformity or scoliosis noted of thoracic or lumbar spine.  no acute joint changes Pulses:  R and L carotid,radial,femoral,dorsalis pedis and posterior tibial pulses are full and equal bilaterally Extremities:  No clubbing, cyanosis, edema, or deformity noted with normal full range of motion of all joints.   Neurologic:  sensation intact to light touch, gait normal, and DTRs symmetrical and normal.   Skin:  Intact without suspicious lesions or rashes Cervical Nodes:  No lymphadenopathy noted Inguinal Nodes:  No significant adenopathy Psych:  normal affect, talkative and pleasant    Impression & Recommendations:  Problem # 1:  PURE HYPERCHOLESTEROLEMIA (ICD-272.0) Assessment Improved  this is improved to goal with better diet  disc getting LDL under 130  rev low sat fat diet   Labs Reviewed: SGOT: 20 (02/05/2010)   SGPT: 18 (02/05/2010)   HDL:49.50 (02/05/2010), 50.90 (11/06/2009)  LDL:130 (02/05/2010), 112 (10/08/2008)  Chol:196 (02/05/2010), 214 (11/06/2009)  Trig:84.0 (02/05/2010), 76.0 (11/06/2009)  Orders: Prescription Created Electronically [phone])  Problem # 2:  OSTEOPENIA (ICD-733.90) Assessment: Deteriorated  rev dexa today disc imp of exercise and rec for ca and D will try evista (avoid oral bisph due to GI issues) will always have to be on PPI and also is hypothyroid  disc safety  Her updated medication list for this problem includes:    Evista 60 Mg Tabs (Raloxifene hcl) .Marland Kitchen... 1 by mouth once daily  Bone Density: osteopenia (11/12/2009) Bone Density-LS: -1.8 (11/12/2009) Vit D:52 (11/06/2009), 38 (10/08/2008)  Orders: Prescription Created Electronically [phone])  Problem # 3:  Hx of SLEEP APNEA (ICD-780.57) Assessment: Unchanged this is a problem and pt cannot tol most cpap she will be ref to sleep clinic to disc opt for tx and perhaps more comfortable cpap Orders: Sleep Disorder Referral (Sleep Disorder) Prescription Created Electronically  [phone])  Complete Medication List: 1)  Synthroid 88 Mcg Tabs (Levothyroxine sodium) .... Take 1 tablet by mouth once a day 2)  Hydrochlorothiazide 25 Mg Tabs (Hydrochlorothiazide) .Marland Kitchen.. 1 by mouth once daily 3)  Daily Combo Multivits/calcium Tabs (Multiple vitamins-calcium) .Marland Kitchen.. 1 by mouth once daily 4)  Omeprazole 20 Mg Cpdr (Omeprazole) .... One tab by mouth daily 5)  Cutivate 0.05 % Lotn (Fluticasone propionate) .... Apply to affected area once daily on ears as needed  do not use for over 7 days in a row 6)  Multivitamins Tabs (Multiple vitamin) .... Take one by mouth once daily 7)  Evista 60 Mg Tabs (Raloxifene hcl) .Marland Kitchen.. 1 by mouth once daily  Patient Instructions: 1)  the current recommendation for calcium intake is 1200-1500 mg daily with -1000 IU of vitamin D  2)  try evista and update me if any side effects or problems 3)  we will refer you to our sleep clinic at check out  Prescriptions: EVISTA 60 MG TABS (RALOXIFENE HCL) 1 by mouth once daily  #30 x 11   Entered and Authorized by:   Judith Part MD   Signed by:   Judith Part MD on 02/10/2010   Method used:   [facility]  [address] ([facility]ail)       [address]       [place], [place]  [address]       Ph: [phone] or [phone]       Fax: [phone]   RxID:   [id number]   Current Allergies (reviewed today): No known allergies

## 2010-08-07 NOTE — Assessment & Plan Note (Signed)
Summary: ZOSTAVAX/TOWER /NT   Nurse Visit   Allergies: No Known Drug Allergies  Immunizations Administered:  Zostavax # 0:    Vaccine Type: Zostavax    Site: left deltoid    Mfr: Merck    Dose: 0.5 ml    Route: IM    Given by: Lewanda Rife LPN    Exp. Date: 12/13/2010    Lot #: [id number]    VIS given: 04/17/05 given Nov 22, 2009.  Orders Added: 1)  Zoster (Shingles) Vaccine Live [90736] 2)  Admin 1st Vaccine [phone]

## 2010-08-07 NOTE — Progress Notes (Signed)
Summary: Constipation   Phone Note Call from Patient Call back at [phone] CELL   Call For: DR Isham Smitherman Summary of Call: Having issues with the suppositories that she  was told to take. Initial call taken by: Leanor Kail [facility],  December 06, 2009 10:20 AM  Follow-up for Phone Call        Was seen 12-04-09 and was given Mesalamine supp., now she c/o constipation, no BM for 3 days.  1) May try warm prune juice, MOM or any OTC med to relieve constipation. 2) If symptoms become worse call back immediately. 3) I will call pt., if new orders, after MD reviews.   Follow-up by: Laureen Ochs LPN,  December 07, 4538 11:07 AM  Additional Follow-up for Phone Call Additional follow up Details #1::        i agree with these rec's Additional Follow-up by: Rachael Fee MD,  December 06, 2009 11:35 AM

## 2010-08-07 NOTE — Assessment & Plan Note (Signed)
Summary: consult for osa   Copy to:  [name], M.D. Primary Provider/Referring Provider:  [name], M.D.  CC:  Sleep Consult.Marland Kitchen  History of Present Illness: The pt is a 65y/o female who I have been asked to see for management of osa.  She was first diagnosed with severe sleep apnea in 2007, with AHI of 45/hr and optimal cpap pressure of 14.  At that time, she made the decision to take 6-5mos to work aggressively on weight loss since she had very few symptoms.  She was unable to accomplish this, and never followed up.  She comes in today where she is continuing to have loud snoring, but unsure if she is having an abnormal breathing pattern.  She does have a lot of GERD symptoms at night for which she is on PPI.  She goes to bed around 10pm, and arises around 6am to start her day.  She has frequent awakenings during the night, and is not rested upon arising.  She stays very busy at work, and does not feel she has the time to be sleepy.  However, she can have significant sleep pressure with business meetings.  She denies any issues staying awake for tv or movies, and had no issues with driving.  Her weight is up 10 pounds over the last 2 years, and her epworth score today is only 1.  Current Medications (verified): 1)  Synthroid 88 Mcg Tabs (Levothyroxine Sodium) .... Take 1 Tablet By Mouth Once A Day 2)  Hydrochlorothiazide 25 Mg Tabs (Hydrochlorothiazide) .Marland Kitchen.. 1 By Mouth Once Daily 3)  Daily Combo Multivits/calcium  Tabs (Multiple Vitamins-Calcium) .Marland Kitchen.. 1 By Mouth Once Daily 4)  Omeprazole 20 Mg  Cpdr (Omeprazole) .... One Tab By Mouth Daily 5)  Cutivate 0.05 % Lotn (Fluticasone Propionate) .... Apply To Affected Area Once Daily On Ears As Needed  Do Not Use For Over 7 Days in A Row 6)  Multivitamins  Tabs (Multiple Vitamin) .... Take One By Mouth Once Daily  Allergies (verified): No Known Drug Allergies  Past History:  Past Medical  History: Hypertension Hypothyroidism diverticulosis GERD/esophagitis with ulceration osteopenia  bone spur- L foot - posterior menopausal symptoms /hot flashes sleep apnea - AHI 45/hr in 2007.  GI-- Dr Christella Hartigan  endo - March Rummage PA- Dr Katrinka Blazing ([facility]) GYN- Dr Arlyce Dice  podiatry [facility]   Past Surgical History: Hysterectomy- partial, endometriosis (1983) Lumpectomy- breast, benign (1970's) Tonsillectomy Breast implants- saline (1994) Nodule removed from thumb Colonoscopy- diverticulosis (05/2000) colonoscopy - nl with neg bx (9/09) EGD esophagitis with ulcerations --? barretts (9/09) screen [place] for AAA- negative 3/10  carotid doppler screen neg 3/10  heel dexa T score -1.0  Family History: Reviewed history from 01/28/2010 and no changes required. father lung ca mother HTN, colon polyps, chol, AAA, heart problems , migraines , cholesterol  brother migriane  Maunt breast ca No FH of Colon Cancer  Social History: Reviewed history from 10/08/2008 and no changes required. 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  The patient denies shortness of breath with activity, shortness of breath at rest, productive cough, non-productive cough, coughing up blood, chest pain, irregular heartbeats, loss of appetite, abdominal  pain, difficulty swallowing, sore throat, tooth/dental problems, itching, ear ache, depression, joint stiffness or pain, rash, change in color of mucus, and fever.    Vital Signs:  Patient profile:   65 year old female Height:      66 inches Weight:      187.50 pounds BMI:     30.37 O2 Sat:      96 % on Room air Temp:     98.4  degrees F oral Pulse rate:   80 / minute BP sitting:   140 / 88  (left arm) Cuff size:   regular  Vitals Entered By: Gweneth Dimitri RN (February 27, 2010 1:52 PM)  O2 Flow:  Room air CC: Sleep Consult. Comments Medications reviewed with patient Daytime contact number verified with patient. Gweneth Dimitri RN  February 27, 2010 1:52 PM    Physical Exam  General:  ow female in nad Eyes:  PERRLA and EOMI.   Nose:  narrowed bilat, but not obstructed  Mouth:  very long palate and uvula Neck:  no jvd, tmg, LN Lungs:  clear to auscultation Heart:  rrr, no mrg. Abdomen:  soft and nontender, bs+ Extremities:  no edema or cyanosis  pulses intact distally Neurologic:  alert and oriented, moves all 4.   Impression & Recommendations:  Problem # 1:  OBSTRUCTIVE SLEEP APNEA (ICD-327.23) The pt has a history of severe osa that has never been treated.  Although she does not feel this impacts her QOL during the day, she is bothered by the sleep disruption and lack of restorative sleep at night.  I have also explained to her this is a significant health risk going forward.  Her best treatment options for this are cpap while working on weight loss.  I have also discussed with her upper airway surgery and dental appliance, but these are unlikely to fully treat her degree of sleep apnea.  She is willing to try cpap, and I suspect she is going to see a difference even during her daytime hours.  I will set the patient up on cpap at a moderate pressure level to allow for desensitization, and will troubleshoot the device over the next 4-6weeks if needed.  The pt is to call me if having issues with tolerance.  Will then optimize the pressure once patient is able to wear cpap on a consistent basis.  Other Orders: Consultation Level IV (82956) DME Referral (DME)  Patient Instructions: 1)  will start on cpap.  Please call if having issues with tolerance. 2)  work on weight loss 3)  followup with me in 5  weeks.

## 2010-08-07 NOTE — Assessment & Plan Note (Signed)
Summary: sore throat, coughing/alc   Vital Signs:  Patient profile:   65 year old female Weight:      189.25 pounds Temp:     99.6 degrees F oral Pulse rate:   86 / minute Pulse rhythm:   regular BP sitting:   126 / 84  (left arm) Cuff size:   large  Vitals Entered By: Selena Batten Dance CMA Duncan Dull) (April 22, 2010 3:35 PM) CC: Sinus issues/cough   History of Present Illness: CC: sinus issues/cough  4 d h/o bad ST, hoarse voice.  Yesterday progressed to bad dry cough with frontal headache.  Sharp HA worse with bending foward and coughing.  Coughing worse at night.  + mild fever subjective.  Taking cheratussin and helped some.  No congestion, no RN.  No Abd pain, n/v/myalgias, joint pains.  Had flu shot end of september.  No sick contacts.  No smokers at home.  + husband travels internationally ([place], [place], [place], [place]).  Current Medications (verified): 1)  Synthroid 88 Mcg Tabs (Levothyroxine Sodium) .... Take 1 Tablet By Mouth Once A Day 2)  Hydrochlorothiazide 25 Mg Tabs (Hydrochlorothiazide) .Marland Kitchen.. 1 By Mouth Once Daily 3)  Daily Combo Multivits/calcium  Tabs (Multiple Vitamins-Calcium) .Marland Kitchen.. 1 By Mouth Once Daily 4)  Omeprazole 20 Mg  Cpdr (Omeprazole) .... One Tab By Mouth Daily 5)  Multivitamins  Tabs (Multiple Vitamin) .... Take One By Mouth Once Daily 6)  Viactive .Marland Kitchen.. 1 By Mouth Three Times A Day  Allergies (verified): No Known Drug Allergies  Past History:  Past Medical History: Last updated: 02/27/2010 Hypertension Hypothyroidism diverticulosis GERD/esophagitis with ulceration osteopenia  bone spur- L foot - posterior menopausal symptoms /hot flashes sleep apnea - AHI 45/hr in 2007.  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  no sinus tenderness Eyes:  vision grossly intact, pupils equal, pupils round, and pupils reactive to light.  no conjunctival pallor, injection or icterus  Ears:  R ear normal and L ear normal.  fluid behind ears bilaterally Nose:  no nasal discharge.   Mouth:  pharynx pink and moist.   Neck:  supple with full rom and no masses or thyromegally, no JVD or carotid bruit  Lungs:  Normal respiratory effort, chest expands symmetrically. Lungs are clear to auscultation, no crackles or wheezes. Heart:  Normal rate and regular rhythm. S1 and S2 normal without gallop, murmur, click, rub or other extra sounds. Skin:  Intact without suspicious lesions or rashes   Impression & Recommendations:  Problem # 1:  VIRAL URI (ICD-465.9) viral urti.  could be early sinusitis (3d hx).  treat supportively with cough suppresant, fluids and rest, tylenol for HA.  rec return if worsening or not improving as expected, or for red flags.  to call at end of week, if not any better, consider zpack to treat early sinusitis.  Complete Medication List: 1)  Synthroid 88 Mcg Tabs (Levothyroxine sodium) .... Take 1 tablet by mouth once a day 2)  Hydrochlorothiazide 25 Mg Tabs (  Hydrochlorothiazide) .Marland Kitchen.. 1 by mouth once daily 3)  Daily Combo Multivits/calcium Tabs (Multiple vitamins-calcium) .Marland Kitchen.. 1 by mouth once daily 4)  Omeprazole 20 Mg Cpdr (Omeprazole) .... One tab by mouth daily 5)  Multivitamins Tabs (Multiple vitamin) .... Take one by mouth once daily 6)  Viactive  .Marland KitchenMarland Kitchen. 1 by mouth three times a day  Patient Instructions: 1)  Sounds like you have a viral upper respiratory infection. 2)  Antibiotics are not needed for this.  Viral infections usually  take 7-10 days to resolve.  The cough can last up to 4-6 weeks to go away. 3)  Continue using cheratussin. 4)  May continue tylenol for headache. 5)  Call us at end of week if not improving as expected. 6)  Please return if you are not improving as expected, or if you have high fevers (>101.5) or difficulty breathing. 7)  Call clinic with questions.  Pleasure to see you today!    Orders Added: 1)  Est. Patient Level III [14782]    Current Allergies (reviewed today): No known allergies

## 2010-08-07 NOTE — Progress Notes (Signed)
Summary: not feeling better   Phone Note Call from Patient   Caller: Patient Call For: Judith Part MD/ Dr. Sharen Hones  Summary of Call: Patient was here on the 18th and was told to call back at the end of the week if not feeling better. She is calling today because she doesn't want to have to go the weekend feeling this way. She says that her head feels as if it is going to explode with every cough. She is asking if there is something that can be called in to [facility] with the cough and headache. Please advise. [facility]ses  [facility].  Initial call taken by: Melody Comas,  April 24, 2010 3:14 PM  Follow-up for Phone Call        Called patient.  still having pain with coughing and pressure pain with bending head down.  Sent in zpack for patient (treating as presumed sinusitis), will refill cheratussin script for patient.  advised to start taking tylenol for head.  cheratussin printed out for patint to pick up tomorrwo. Follow-up by: Eustaquio Boyden  MD,  April 24, 2010 6:25 PM  Additional Follow-up for Phone Call Additional follow up Details #1::        Rx placed up front for pt Additional Follow-up by: Janee Morn CMA Duncan Dull),  April 25, 2010 8:33 AM    New/Updated Medications: ZITHROMAX Z-PAK 250 MG TABS (AZITHROMYCIN) take as directed CHERATUSSIN AC 100-10 MG/5ML SYRP (GUAIFENESIN-CODEINE) one teaspoon q6 hours as needed cough Prescriptions: CHERATUSSIN AC 100-10 MG/5ML SYRP (GUAIFENESIN-CODEINE) one teaspoon q6 hours as needed cough  #100cc x 0   Entered and Authorized by:   Eustaquio Boyden  MD   Signed by:   Eustaquio Boyden  MD on 04/24/2010   Method used:   Print then Give to Patient   RxID:   [id number] ZITHROMAX Z-PAK 250 MG TABS (AZITHROMYCIN) take as directed  #1 x 0   Entered and Authorized by:   Eustaquio Boyden  MD   Signed by:   Eustaquio Boyden  MD on 04/24/2010   Method used:   [facility]        [facility] (retail)       [address]       [place], [place]  [address]       Ph: [phone] or [phone]       Fax: [phone]   RxID:   [id number]   Appended Document: not feeling better  feeling better, more energy.  Starting to cough up some phlegm.  still with HA with cough and bending head forward, but somewhat improving.  Advised if not better by end of next week to return to Dr. Milinda Antis for further evaluation as to why not improving as expected.  If any red flags, to seek urgent medical care (uncontrolled n/v, unable to keep by mouth down, weakness on one side of body, vision changes, or facial drooping etc)

## 2010-08-07 NOTE — Assessment & Plan Note (Signed)
Review of gastrointestinal problems: 1. Routine risk for colorectal cancer. Colonoscopy September 2009 showed no precancerous polyps. Next colonoscopy September 2019. 2. GERD with ulcerative esophagitis status post EGD September 2009. No Barrett's changes no stricturing    History of Present Illness Visit Type: consult  Primary GI MD: Rob Bunting MD Primary Provider: [name], M.D. Requesting Provider: [name], M.D. Chief Complaint: Fecal Incontinence  History of Present Illness:     a very pleasant 65 year old woman whom I last saw about 2 years ago.  who has been having fecal incontinence.  Will have her usual soft, solid, nonbloody BM twice a day.  Following a BM she will notice wetness in her underpants.  Never bloody.  She will go back to bathroom, will have a mucousy discharge on TP.  This occurs about 3-4 times a week.  She has not tried suppositories.  No significant diet changes lately.    She has started flax seed oil recently for cholesterol.  Takes omeprazole daily sometimes twice a day and GERD synmptoms under good control as long as she does.  No dypshagia.             Current Medications (verified): 1)  Synthroid 88 Mcg Tabs (Levothyroxine Sodium) .... Take 1 Tablet By Mouth Once A Day 2)  Hydrochlorothiazide 25 Mg Tabs (Hydrochlorothiazide) .Marland Kitchen.. 1 By Mouth Once Daily 3)  Daily Combo Multivits/calcium  Tabs (Multiple Vitamins-Calcium) .Marland Kitchen.. 1 By Mouth Once Daily 4)  Omeprazole 20 Mg  Cpdr (Omeprazole) .... One Tab By Mouth Daily 5)  Cutivate 0.05 % Lotn (Fluticasone Propionate) .... Apply To Affected Area Once Daily On Ears As Needed  Do Not Use For Over 7 Days in A Row  Allergies (verified): No Known Drug Allergies  Vital Signs:  Patient profile:   65 year old female Height:      65.25 inches Weight:      178 pounds BMI:     29.50 BSA:     1.89 Pulse rate:   62 / minute Pulse rhythm:   regular BP sitting:   124 / 82  Vitals Entered By:  Ok Anis CMA (December 04, 2009 9:44 AM)  Physical Exam  Additional Exam:  Constitutional: generally well appearing Psychiatric: alert and oriented times 3 Abdomen: soft, non-tender, non-distended, normal bowel sounds rectal examination with female assistant in room: no external anal hemorrhoids, no internal masses or hemorrhoids, no obvious fissures.   Impression & Recommendations:  Problem # 1:  minor fecal discharge, incontinence at the time of her colonoscopy about 2 years ago I did note some minor erythema in her distal rectum. This was biopsied and those biopsies did not prove any chronic inflammation. Perhaps he indeed does have minor inflammation however. I think a trial of mesalamine suppositories is reasonable at this point. She will return to see me in 6 weeks. If not better at that point than a brief fiber trial of bulk her stools will be the next step. If all else fails I want to do a flexible sigmoidoscopy.  Patient Instructions: 1)  Trial of mesalamine suppository once nightly. 2)  Return to see Dr. Christella Hartigan in 6 weeks. 3)  The medication list was reviewed and reconciled.  All changed / newly prescribed medications were explained.  A complete medication list was provided to the patient / caregiver. Prescriptions: CANASA 1000 MG  SUPP (MESALAMINE) insert one suppository recally every night  #30 x 3   Entered and Authorized by:   Reuel Boom  Marye Round MD   Signed by:   Rachael Fee MD on 12/04/2009   Method used:   [facility]        [facility] (retail)       [address]       [place], [place]  [address]       Ph: [phone] or [phone]       Fax: [phone]   RxID:   [id number]

## 2010-08-07 NOTE — Letter (Signed)
Summary: Results Follow up Letter  [place] [facility]  [address] [place], [place] [address]   Phone: [phone]  Fax: [phone]    05/02/2010 MRN: [id number]    Chanelle Mcewan [address] [place], [place]  [address]    Dear Ms. SPARLIN,  The following are the results of your recent test(s):  Test         Result    Pap Smear:        Normal _____  Not Normal _____ Comments: ______________________________________________________ Cholesterol: LDL(Bad cholesterol):         Your goal is less than:         HDL (Good cholesterol):       Your goal is more than: Comments:  ______________________________________________________ Mammogram:        Normal __X___  Not Normal _____ Comments:Repeat in one year.   ___________________________________________________________________ Hemoccult:        Normal _____  Not normal _______ Comments:    _____________________________________________________________________ Other Tests:    We routinely do not discuss normal results over the telephone.  If you desire a copy of the results, or you have any questions about this information we can discuss them at your next office visit.   Sincerely,    Idamae [place] Tower,MD  MT/ri

## 2010-08-07 NOTE — Assessment & Plan Note (Signed)
Summary: CPX /RBH   Vital Signs:  Patient profile:   65 year old female Height:      65.25 inches Weight:      181 pounds BMI:     30.00 Temp:     98.1 degrees F oral Pulse rate:   64 / minute Pulse rhythm:   regular BP sitting:   120 / 80  (left arm) Cuff size:   regular  Vitals Entered By: Lewanda Rife LPN (Nov 07, 1608 9:46 AM) CC: CPX LMP part Hyst 1983   History of Present Illness: here for health mt exam and to rev chronic med probls  overall feel pretty good   recent labs chol high with trig 76/ HDL 50 and LDL 149 ( up from 112)  has eaten a lot of tilapia  no red meat  low chol spread- no butter    sugar 114 fasting   bp is 120/80- good   wt is up 2 lb with bmi of 30 is really struggling with her weight  has free acess to nutritionist at work  walking every day 3 12/ miles in an hour  does some muscle training   osteopenia - ? dexa-- when last one -with Dr Arlyce Dice  ca and D- is very good control  lots of exercise    mam 10/10 self exam -- no lumps or changed   partial hyst in past - for benign mass in the 1980s - removed mass and one ovary  no gyn symptoms in the past  pap 08 does not like her odor- has had all her life - wants to know if it is ok to douch or use other products   colonosc09 - due in 29 y has hx of diverticulosis -- then got better  after she has bms -- for several hours gets a little leakage / mucous and uncomfortable -- feels wet  going on for 2 years   may be interested in zostavax  Allergies (verified): No Known Drug Allergies  Past History:  Past Surgical History: Last updated: 10/08/2008 Hysterectomy- partial, endometriosis (1983) Lumpectomy- breast, benign (1970's) Tonsillectomy Breast implants- saline (1994) Nodule removed from thumb Caesarean section X 2 (1971, 1975) Colonoscopy- diverticulosis (05/2000) colonoscopy - nl with neg bx (9/09) EGD esophagitis with ulcerations --? 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, she does not drink alcohol, is married helpint care for mother with AAA surgery   Risk Factors: Smoking Status: never (08/05/2007)  Past Medical History: Hypertension Hypothyroidism diverticulosis GERD/esophagitis with ulceration osteopenia  bone spur- L foot - posterior menopausal symptoms /hot flashes sleep apnea - non tolerant of cpap  GI-- Dr Christella Hartigan  endo - March Rummage [place]- Dr Katrinka Blazing ([facility]) GYN- Dr Arlyce Dice  podiatry [facility]   Family History: father lung ca mother HTN, colon polyps, chol, AAA, heart problems , migraines , cholesterol  brother migriane  Maunt breast ca No FH of Colon Cancer:  Review of Systems General:  Complains of fatigue; denies loss of appetite, malaise, and weight loss. Eyes:  Denies blurring and eye pain. CV:  Denies chest pain or discomfort, lightheadness, and palpitations. Resp:  Denies cough and wheezing. GI:  Denies abdominal pain, change in bowel habits, and indigestion. GU:  Denies abnormal vaginal bleeding, discharge, dysuria, and urinary frequency. MS:  Denies joint pain, joint redness, joint swelling, and stiffness. Derm:  Denies itching, lesion(s), poor wound healing, and rash. Neuro:  Denies numbness and tingling. Psych:  Denies anxiety and depression. Endo:  Denies cold intolerance, excessive hunger, excessive thirst, excessive urination, and heat intolerance. Heme:  Denies abnormal bruising and bleeding.  Physical Exam  General:  overweight but generally well appearing  Head:  normocephalic, atraumatic, and no  abnormalities observed.   Eyes:  vision grossly intact, pupils equal, pupils round, and pupils reactive to light.  no conjunctival pallor, injection or icterus  Ears:  R ear normal and L ear normal.   Nose:  no nasal discharge.   Mouth:  pharynx pink and moist.   Neck:  supple with full rom and no masses or thyromegally, no JVD or carotid bruit  Chest Wall:  No deformities, masses, or tenderness noted. Breasts:  No mass, nodules, thickening, tenderness, bulging, retraction, inflamation, nipple discharge or skin changes noted.  breast impants present  Lungs:  Normal respiratory effort, chest expands symmetrically. Lungs are clear to auscultation, no crackles or wheezes. Heart:  Normal rate and regular rhythm. S1 and S2 normal without gallop, murmur, click, rub or other extra sounds. Abdomen:  Bowel sounds positive,abdomen soft and non-tender without masses, organomegaly or hernias noted. no renal bruits  no suprapubic tenderness or fullness felt  Msk:  No deformity or scoliosis noted of thoracic or lumbar spine.  no acute joint changes Pulses:  R and L carotid,radial,femoral,dorsalis pedis and posterior tibial pulses are full and equal bilaterally Extremities:  No clubbing, cyanosis, edema, or deformity noted with normal full range of motion of all joints.   Neurologic:  sensation intact to light touch, gait normal, and DTRs symmetrical and normal.   Skin:  erythema with scale at entrance to R ear/ less on the left no cracking or plaque formation no other rash  Cervical Nodes:  No lymphadenopathy noted Axillary Nodes:  No palpable lymphadenopathy Inguinal Nodes:  No significant adenopathy Psych:  normal affect, talkative and pleasant    Impression & Recommendations:  Problem # 1:  HEALTH MAINTENANCE EXAM (ICD-V70.0) Assessment Comment Only reviewed health habits including diet, exercise and skin cancer prevention reviewed health maintenance list and family history disc wt loss goals and  diet and exercise plan  commended on great effort so far   Problem # 2:  Hx of SLEEP APNEA (ICD-780.57) Assessment: Deteriorated could not tol cpap- will disc at f/u this may be one of the reasons for difficulty loosing wt  ? consider other options besides cpap  Problem # 3:  OSTEOPENIA (ICD-733.90) Assessment: Unchanged rev ca and vit D rec  sched dexa   Problem # 4:  HYPOTHYROIDISM (ICD-244.9) Assessment: Unchanged  no clinical changes besides difficulty loosing wt  stable tsh here and with her endocrinologist  Her updated medication list for this problem includes:    Synthroid 88 Mcg Tabs (Levothyroxine sodium) .Marland Kitchen... Take 1 tablet by mouth once a day  Orders: Radiology Referral (Radiology)  Labs Reviewed: TSH: 0.12 (10/08/2008)    Chol: 168 (10/08/2008)   HDL: 42.80 (10/08/2008)   LDL: 112 (10/08/2008)   TG: 68.0 (10/08/2008)  Problem # 5:  Hx of REFLUX, ESOPHAGEAL (ICD-530.81) Assessment: Unchanged controlled well with omeprazole - no changes  Her updated medication list for this problem includes:    Omeprazole 20 Mg Cpdr (Omeprazole) ..... One tab by mouth daily  Problem # 6:  ECZEMA (ICD-692.9) Assessment: New  on and in ear canals - chronic and intermittent px cutivate cream which works well  adv not to use every single day Her updated medication list for this problem includes:    Cutivate 0.05 % Lotn (Fluticasone propionate) .Marland Kitchen... Apply to affected area once daily on ears as needed  do not use for over 7 days in a row  Orders: Prescription Created Electronically [phone])  Problem # 7:  ANAL OR RECTAL PAIN (VOZ-366.44) Assessment: New with leakage after BM this is uncontrolable ref to GI for eval  Orders: Gastroenterology Referral (GI) Prescription Created Electronically [phone])  Complete Medication List: 1)  Synthroid 88 Mcg Tabs (Levothyroxine sodium) .... Take 1 tablet by mouth once a day 2)  Hydrochlorothiazide 25 Mg Tabs (Hydrochlorothiazide) .Marland Kitchen.. 1  by mouth once daily 3)  Daily Combo Multivits/calcium Tabs (Multiple vitamins-calcium) .Marland Kitchen.. 1 by mouth once daily 4)  Omeprazole 20 Mg Cpdr (Omeprazole) .... One tab by mouth daily 5)  Cutivate 0.05 % Lotn (Fluticasone propionate) .... Apply to affected area once daily on ears as needed  do not use for over 7 days in a row  Patient Instructions: 1)  you can raise your HDL (good cholesterol) by increasing exercise and eating omega 3 fatty acid supplement like fish oil or flax seed oil over the counter 2)  you can lower LDL (bad cholesterol) by limiting saturated fats in diet like red meat, fried foods, egg yolks, fatty breakfast meats, high fat dairy products and shellfish 3)  we will schedule bone density test at check out  4)  if you are interested in a shingles vaccine in the future - call your insurance and call us to schedule  5)  name of the vaccine is zostavax  6)  make your own appt with Dr Al Corpus for toenail problem  7)  use cultivate cream for ear eczema as needed  8)  schedule fasting lab in 3 months for lipid/ast/alt 272 and then follow up to discuss results  9)  we will do GI ref at check out Prescriptions: OMEPRAZOLE 20 MG  CPDR (OMEPRAZOLE) one tab by mouth daily  #90 x 3   Entered and Authorized by:   Judith Part MD   Signed by:   Judith Part MD on 11/07/2009   Method used:   [facility]        [facility]  [address] [id number]ret[facility]       [address]       [place], [place]  [address]       Ph: [phone] or [phone]       Fax: [phone]   RxID:   [id number] CUTIVATE 0.05 % LOTN (FLUTICASONE PROPIONATE) apply to affected area once daily on ears as needed  do not use for over 7 days in a row  #1 small x 3   Entered and Authorized by:   Judith Part MD   Signed by:   Judith Part MD on 11/07/2009   Method used:   [facility]        [facility]  [address] [facility]       [address]       [place], [place]  [address]        Ph: [phone] or [phone]  Fax: [phone]   RxID:   [id number]   Current Allergies (reviewed today): No known allergies

## 2010-08-07 NOTE — Progress Notes (Signed)
Summary: should pt get Tdap?  Phone Note Call from Patient Call back at Work Phone [phone]   Caller: Patient Call For: Judith Part MD Summary of Call: Pt is going to [place] next month for her job and the nurse at work has told her that she needs to get a  Tdap.  She had a Td in 2007.  Please advise on whether you think she should get the Tdap. Initial call taken by: Lowella Petties CMA,  Nov 20, 2009 12:39 PM  Follow-up for Phone Call        if she is around babies or children the Tdap can give her extra protection from pertussis - which is dangerous to the very young  if she is not around babies and children the up to date Td is probably ok  Follow-up by: Judith Part MD,  Nov 20, 2009 1:32 PM  Additional Follow-up for Phone Call Additional follow up Details #1::        Patient notified as instructed by telephone. Pt said for the trip she would not be around small children but she is around her grandchildren which are pre toddlers and toddlers. Pt will decide and let us know if she wants the tdap.Lewanda Rife LPN  Nov 20, 2009 4:06 PM

## 2010-08-07 NOTE — Assessment & Plan Note (Signed)
Review of gastrointestinal problems: 1. Routine risk for colorectal cancer. Colonoscopy September 2009 showed no precancerous polyps. Next colonoscopy September 2019. 2. GERD with ulcerative esophagitis status post EGD September 2009. No Barrett's changes no stricturing   History of Present Illness Visit Type: Follow-up Visit Primary GI MD: Rob Bunting MD Primary Provider: [name], M.D. Requesting Provider: na Chief Complaint: Follow up  History of Present Illness:     very pleasant 65 year old woman whom I last saw about one month ago. Since then she tried the suppostory twice and both times she became constipated.  she stop the suppositories.  Fortunately  The soilage, mucous discharge following a BM has lessened.  She has no issues is at least a week.  she brought in an article in the newspaper discussing long-term effects of proton pump inhibitors, risks of osteopenia.           Current Medications (verified): 1)  Synthroid 88 Mcg Tabs (Levothyroxine Sodium) .... Take 1 Tablet By Mouth Once A Day 2)  Hydrochlorothiazide 25 Mg Tabs (Hydrochlorothiazide) .Marland Kitchen.. 1 By Mouth Once Daily 3)  Daily Combo Multivits/calcium  Tabs (Multiple Vitamins-Calcium) .Marland Kitchen.. 1 By Mouth Once Daily 4)  Omeprazole 20 Mg  Cpdr (Omeprazole) .... One Tab By Mouth Daily 5)  Cutivate 0.05 % Lotn (Fluticasone Propionate) .... Apply To Affected Area Once Daily On Ears As Needed  Do Not Use For Over 7 Days in A Row 6)  Multivitamins  Tabs (Multiple Vitamin) .... Take One By Mouth Once Daily  Allergies (verified): No Known Drug Allergies  Family History: father lung ca mother HTN, colon polyps, chol, AAA, heart problems , migraines , cholesterol  brother migriane  Maunt breast ca No FH of Colon Cancer  Vital Signs:  Patient profile:   65 year old female Height:      62.5 inches Weight:      183.6 pounds BMI:     33.17 Pulse rate:   80 / minute Pulse rhythm:   regular BP sitting:   122 / 80   (left arm) Cuff size:   regular  Vitals Entered By: Harlow Mares CMA Duncan Dull) (January 28, 2010 8:46 AM)  Physical Exam  Additional Exam:  Constitutional: generally well appearing Psychiatric: alert and oriented times 3 Abdomen: soft, non-tender, non-distended, normal bowel sounds    Impression & Recommendations:  Problem # 1:  GERD, history of ulcerative esophagitis she has clear GERD symptoms when she does not take her proton pump inhibitor once daily. She has a history of ulcerative esophagitis. She does understand that there may be a small risk of osteopenia however she is already on calcium and exercises and is overall in good health. I did recommend that she could try taking an H2 blocker instead, twice daily, and see if this controls her GERD symptoms.  Problem # 2:  mucus discharge this has improved. I recommended that if it returns she should try fiber supplements.  Patient Instructions: 1)  If you become bothered again by mucous, then you should begin taking citrucel powder fiber supplement (orange flavor).  Start with a small spoonful and increase this over 1 week to a full, heaping spoonful daily.  You may notice some bloating when you first start the fiber, but that usually resolves after a few days. 2)  GERD handout given. 3)  Trial of H2 blocker twice daily (pepcid or zantac) for a month.  If these control your GERD symptoms, then stay on them. 4)  The medication  list was reviewed and reconciled.  All changed / newly prescribed medications were explained.  A complete medication list was provided to the patient / caregiver.

## 2010-08-15 ENCOUNTER — Encounter: Payer: Self-pay | Admitting: Family Medicine

## 2010-08-15 ENCOUNTER — Ambulatory Visit (INDEPENDENT_AMBULATORY_CARE_PROVIDER_SITE_OTHER): Payer: BC Managed Care – PPO | Admitting: Family Medicine

## 2010-08-15 DIAGNOSIS — R05 Cough: Secondary | ICD-10-CM

## 2010-08-15 DIAGNOSIS — R059 Cough, unspecified: Secondary | ICD-10-CM | POA: Insufficient documentation

## 2010-08-15 DIAGNOSIS — K219 Gastro-esophageal reflux disease without esophagitis: Secondary | ICD-10-CM

## 2010-08-18 ENCOUNTER — Encounter: Payer: Self-pay | Admitting: Family Medicine

## 2010-08-20 ENCOUNTER — Telehealth: Payer: Self-pay | Admitting: Pulmonary Disease

## 2010-08-20 ENCOUNTER — Telehealth: Payer: Self-pay | Admitting: Gastroenterology

## 2010-08-27 NOTE — Progress Notes (Signed)
Summary: triage   Phone Note Call from Patient Call back at Home Phone [phone] Call back at Work Phone [phone]   Caller: Patient Call For: Dr. Christella Hartigan Reason for Call: Talk to Nurse Summary of Call: patient has gerd and ulcer in her esophagus, now she feels like there is something in her throat when she swallows and she has "head pain" when she coughs, has seen primary and they did not find anything Initial call taken by: [place] Johnson,  August 20, 2010 4:13 PM  Follow-up for Phone Call        pt has had problems swallowing and was seen by PCP she then sent to ENT.  She has had a dry cough and a really bad head pain only when she coughs.  PCP told her to increase her omeprazole to two times a day she has done this for 1 week.  This has not helped.  She does have sleep apnea and uses a CPAP and is not sure if that is drying her out.   pt is scheduled to see Dr Christella Hartigan on 09/15/10 Follow-up by: Chales Abrahams CMA Duncan Dull),  August 21, 2010 9:13 AM

## 2010-08-27 NOTE — Assessment & Plan Note (Signed)
Summary: head hurts when coughing/rbh something at back throat   Vital Signs:  Patient profile:   65 year old female Height:      66 inches Weight:      190.50 pounds BMI:     30.86 Temp:     98 degrees F oral Pulse rate:   68 / minute Pulse rhythm:   regular BP sitting:   110 / 78  (left arm) Cuff size:   large  Vitals Entered By: Lewanda Rife LPN (August 15, 2010 12:00 PM) CC: h/a when coughing. Non productive cough, feels like something at back of throat,sharp pain on and off under lt ear. Pain level now 0 unless startes coughing.   History of Present Illness: 1 year ago - saw Dr Dayton Martes for cold symptoms and hoarseness and st and fever and cough and headache dx with sinus infection -- and given zithromax and then got better-- until this october    october -- saw Dr Judith Part  st / hoarse voice and then cough and headache starting then  dx with uri  given zithromax pack and got better again - presumably sinus infection  then got better - until this weekend    6 days ago her symptoms started  only coughing causes the headache -- headache is at times severe  a little bit to bend over - but not much no hoarsness/ no sore throat - but feeling that there is a lump in throat  at no times has she had any cold symptoms -no drip or nasal congestion  no facial   is a little sore to press on L neck L ear occ sharp pain   once or twice had to take off cpap to cough   ? if has cheratussin left     Allergies (verified): No Known Drug Allergies  Past History:  Past Medical History: Last updated: 02/27/2010 Hypertension Hypothyroidism diverticulosis GERD/esophagitis with ulceration osteopenia  bone spur- L foot - posterior menopausal symptoms /hot flashes sleep apnea - AHI 45/hr in 2007.  GI-- Dr Christella Hartigan  endo - March Rummage PA- Dr Katrinka Blazing ([facility]) GYN- Dr Arlyce Dice  podiatry - [facility]   Past Surgical History: Last updated: 02/27/2010 Hysterectomy- partial, endometriosis  (1983) Lumpectomy- breast, benign (1970's) Tonsillectomy Breast implants- saline (1994) Nodule removed from thumb Colonoscopy- diverticulosis (05/2000) colonoscopy - nl with neg bx (9/09) EGD esophagitis with ulcerations --? 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 Eyes:  Denies blurring and discharge. ENT:  Denies nasal congestion, postnasal drainage, sinus pressure, and sore throat. CV:  Denies chest pain or discomfort, palpitations, and shortness of breath with exertion. Resp:  Complains of cough; denies pleuritic, shortness of breath, sputum productive, and wheezing. GI:  Denies abdominal pain, change in bowel habits, indigestion, and nausea. MS:  Denies cramps. Derm:  Denies itching, lesion(s), poor wound healing, and rash. Neuro:  Complains of  headaches; denies numbness and tingling; head hurts only to cough . Endo:  Denies cold intolerance and heat intolerance. Heme:  Denies abnormal bruising and bleeding.  Physical Exam  General:  overweight but generally well appearing  Head:  normocephalic, atraumatic, and no abnormalities observed.  no sinus tenderness  Eyes:  vision grossly intact, pupils equal, pupils round, pupils reactive to light, and no injection.   Ears:  R ear normal and L ear normal.   Nose:  no nasal discharge.   Mouth:  pharynx pink and  moist, no erythema, and no exudates.  no post nasal drip Neck:  supple with full rom and no masses or thyromegally, no JVD or carotid bruit  Lungs:  Normal respiratory effort, chest expands symmetrically. Lungs are clear to auscultation, no crackles or wheezes. Heart:  Normal rate and regular rhythm. S1 and S2 normal without gallop, murmur, click, rub or other extra sounds. Abdomen:  Bowel sounds positive,abdomen soft and non-tender without masses, organomegaly or hernias noted. Skin:  Intact without suspicious lesions or rashes Cervical Nodes:  No lymphadenopathy noted Psych:  normal affect, talkative and pleasant    Impression & Recommendations:  Problem # 1:  COUGH (ICD-786.2) Assessment New ongoing without any nasal symptoms  she does however have throat globus sensation  I suspect wosening gerd (has had esoph ulcers in past) inc ppi to two times a day  ref to ENT for further eval Orders: ENT Referral (ENT)  Problem # 2:  Hx of REFLUX, ESOPHAGEAL (ICD-530.81) see above  Her updated medication list for this problem includes:    Omeprazole 20 Mg Cpdr (Omeprazole) ..... One tab by mouth two times a day  Orders: ENT Referral (ENT) Prescription Created Electronically [phone])  Complete Medication List: 1)  Synthroid 88 Mcg Tabs (Levothyroxine sodium) .... Take 1 tablet by mouth once a day 2)  Hydrochlorothiazide 25 Mg Tabs (Hydrochlorothiazide) .Marland Kitchen.. 1 by mouth once daily 3)  Omeprazole 20 Mg Cpdr (Omeprazole) .... One tab by mouth two times a day 4)  Multivitamins Tabs (Multiple vitamin) .... Take one by mouth once daily 5)  Viactive  .Marland KitchenMarland Kitchen. 1 by mouth three times a day 6)  Aleve 220 Mg Tabs (Naproxen sodium) .... Otc as directed. 7)  Cheratussin Ac 100-10 Mg/72ml Syrp (Guaifenesin-codeine) .Marland Kitchen.. 1-2 teaspoons by mouth up to every 4-6 hours as needed cough  warn- can sedate  Patient Instructions: 1)  increase your omeprazole to two times a day  2)  use chertussin as needed for  cough if needed 3)  drink lots of fluids 4)  we will do ENT consult at check out  5)  update me if symptoms worsen  Prescriptions: CHERATUSSIN AC 100-10 MG/5ML SYRP (GUAIFENESIN-CODEINE) 1-2 teaspoons by mouth up to every 4-6 hours as needed cough  warn- can sedate  #120cc x 0   Entered and Authorized by:   Judith Part MD   Signed by:   Judith Part MD on 08/15/2010   Method used:   Print then Give to Patient   RxID:   [id number] OMEPRAZOLE 20 MG  CPDR (OMEPRAZOLE) one tab by mouth two times a day  #180 x 1   Entered and Authorized by:   Judith Part MD   Signed by:   Judith Part MD on 08/15/2010   Method used:   [facility]        [facility]  [address] [phone][facility] ([facility]ail[facility]       [address]  [place], [place]  [address]       Ph: [phone] or [phone]       Fax: [phone]   RxID:   [id number]    Orders Added: 1)  ENT Referral [ENT] 2)  Prescription Created Electronically [G8553] 3)  Est. Patient Level IV [27253]    Current Allergies (reviewed today): No known allergies

## 2010-09-02 NOTE — Progress Notes (Signed)
Summary: hasnt heard back regarding pressure [employer] picked up card-  Phone Note Call from Patient   Caller: Patient Call For: [facility] Summary of Call: patient phoned stated that [employer] sent her card in about a month ago and she has not heard back regarding her pressure level. Patient can be reached at [phone] she will be home the rest of today and tomorrow she can be reached [phone] Initial call taken by: Vedia Coffer,  August 20, 2010 4:24 PM  Follow-up for Phone Call        megan have you seen anything on this pt like a download?  please advise. thanks Randell Loop CMA  August 20, 2010 5:22 PM   received down load and put in kc's very important look at folder.  Aundra Millet Reynolds LPN  August 21, 2010 9:36 AM   Additional Follow-up for Phone Call Additional follow up Details #1::        let pt know that her optimal pressure appears to be 10cm.  will send an order to dme to change to that.  If she feels she was more comfortable on auto, can leave on that setting. Additional Follow-up by: Barbaraann Share MD,  August 22, 2010 12:59 PM    Additional Follow-up for Phone Call Additional follow up Details #2::    lmomtcb Randell Loop [facility]  August 22, 2010 1:34 PM  lmomtcb x 2 Mindy Silva  August 25, 2010 11:16 AM    Pt wants to try and stay on the Auto mode for now and try to pay specific attention to her sleep.  She will let us know if this does not work for her. Abigail Miyamoto RN  August 26, 2010 9:43 AM   Additional Follow-up for Phone Call Additional follow up Details #3:: Details for Additional Follow-up Action Taken: ok with me  Additional Follow-up by: Barbaraann Share MD,  August 26, 2010 5:53 PM  pt aware ok with Dr. Shelle Iron to do so.Jennifer [facility] CMA  August 27, 2010 9:19 AM

## 2010-09-08 ENCOUNTER — Telehealth: Payer: Self-pay | Admitting: Family Medicine

## 2010-09-09 ENCOUNTER — Encounter: Payer: Self-pay | Admitting: Family Medicine

## 2010-09-09 ENCOUNTER — Ambulatory Visit (INDEPENDENT_AMBULATORY_CARE_PROVIDER_SITE_OTHER): Payer: BC Managed Care – PPO | Admitting: Family Medicine

## 2010-09-09 DIAGNOSIS — R05 Cough: Secondary | ICD-10-CM

## 2010-09-15 ENCOUNTER — Ambulatory Visit (INDEPENDENT_AMBULATORY_CARE_PROVIDER_SITE_OTHER): Payer: BC Managed Care – PPO | Admitting: Gastroenterology

## 2010-09-15 ENCOUNTER — Encounter: Payer: Self-pay | Admitting: Gastroenterology

## 2010-09-15 DIAGNOSIS — R12 Heartburn: Secondary | ICD-10-CM

## 2010-09-16 NOTE — Progress Notes (Signed)
Summary: still has cough  Phone Note Call from Patient Call back at [phone]   Caller: Patient Call For: Judith Part MD Summary of Call: Pt was seen on 2/10 for a cough.  She continues to have that and headaches when she coughs.  She saw ENT, but she says he didnt do anything.  She says she needs a z-pak, [facility].  She has not had any fever or shortness of breath.  She says her cough is very dry and hacky.  She says she has been seen several times for this.  Please advise. Initial call taken by: Lowella Petties CMA, AAMA,  September 08, 2010 8:26 AM  Follow-up for Phone Call        please send for ENT note and send this back to me- thanks Follow-up by: Judith Part MD,  September 08, 2010 8:36 AM  Additional Follow-up for Phone Call Additional follow up Details #1::        Patient notified as instructed by telephone. Spoke with Terri in medical records at Dr Enrigue Catena Rosen's office and she will fax ENT notes to Dr Milinda Antis.Lewanda Rife LPN  September 07, 9145 2:42 PM   Faxed records from Dr Lucky Rathke office are on your shelf in the in box. Thank you.Marland KitchenLewanda Rife LPN  September 07, 8293 2:53 PM   Pt called back and wanted to see a doctor today. There were no available appts this late but pt does not want to wait until tomorrow to hear from Dr Milinda Antis she wants to be seen tomorrow. Pt scheduled appt to see Dr Lacie Draft has seen him before for the cough) on 09/09/10 at 3:15pm. Copy of ENT note given to Dr Gutierrez's nurse and still left copy on Dr Royden Purl shelf. Pt also said to let Dr Milinda Antis know pt made appt to see Dr Christella Hartigan (GI dr) next week.Lewanda Rife LPN  September 08, 6211 3:54 PM     Additional Follow-up for Phone Call Additional follow up Details #2::    thanks for the update - that is good Follow-up by: Judith Part MD,  September 09, 2010 8:39 AM

## 2010-09-16 NOTE — Consult Note (Signed)
Summary: Dr[facility],Note  Dr.Jefry [facility],Note   Imported By: Beau Fanny 09/10/2010 16:55:03  _____________________________________________________________________  External Attachment:    Type:   Image     Comment:   External Document  Appended Document: Dr.Jefry [facility],Note treat GERD

## 2010-09-16 NOTE — Assessment & Plan Note (Signed)
Summary: dry cough, h/a when she coughs/ri   Vital Signs:  Patient profile:   65 year old female Weight:      190.75 pounds Temp:     98.3 degrees F oral Pulse rate:   68 / minute Pulse rhythm:   regular BP sitting:   118 / 78  (left arm) Cuff size:   large  Vitals Entered By: Selena Batten Dance CMA Duncan Dull) (September 09, 2010 3:14 PM) CC: Dry cough that causes headache   History of Present Illness: CC: dry cough  cough going on since 08/15/2010.  Very sporadic cough.  does get coughing spell where has throbbing frontal head pain with each cough.  Really hurts, lasts seconds.  Once done with coughing, pain resolves.  This has been going on for about a year now.  cough "spell" with frontal pain then sneezes and mild RN for 1 min then resolved.  Cough usually improves with zpack (has had once 08/2009 and again 04/2010).  bending head forward causes increased sinus pressure.  No pain with straining, no pain with bending head over.  No facial pain to palpation.  no fevers/chills, no siginfiicant feelings of congestion.  No ST, CP, abd pain, n/v/d, rashes, myalgia, arthralgia.  minor nosebleeds.  Has increased reflux medicine to omeprazole 20mg  twice daily, hasn't noticed much improvement in this.    Saw ENT, rec treat GERD.  Has appt to see Dr. Christella Hartigan on Monday to further eval GERD.  globus sensation is better.  No h/o allergies, not many sinus infections in past.  No vision changes.  No dizziness.  No urinary/bowel incontinence.  Current Medications (verified): 1)  Synthroid 88 Mcg Tabs (Levothyroxine Sodium) .... Take 1 Tablet By Mouth Once A Day 2)  Hydrochlorothiazide 25 Mg Tabs (Hydrochlorothiazide) .Marland Kitchen.. 1 By Mouth Once Daily 3)  Omeprazole 20 Mg  Cpdr (Omeprazole) .... One Tab By Mouth Two Times A Day 4)  Multivitamins  Tabs (Multiple Vitamin) .... Take One By Mouth Once Daily 5)  Viactive .Marland Kitchen.. 1 By Mouth Three Times A Day  Allergies (verified): No Known Drug Allergies  Past  History:  Past Medical History: Last updated: 02/27/2010 Hypertension Hypothyroidism diverticulosis GERD/esophagitis with ulceration osteopenia  bone spur- L foot - posterior menopausal symptoms /hot flashes sleep apnea - AHI 45/hr in 2007.  GI-- Dr Christella Hartigan  endo - March Rummage PA- Dr Katrinka Blazing ([facility]) GYN- Dr Arlyce Dice  podiatry - [facility]   Past Surgical History: Last updated: 02/27/2010 Hysterectomy- partial, endometriosis (1983) Lumpectomy- breast, benign (1970's) Tonsillectomy Breast implants- saline (1994) Nodule removed from thumb Colonoscopy- diverticulosis (05/2000) colonoscopy - nl with neg bx (9/09) EGD esophagitis with ulcerations --? barretts (9/09) screen [place] for AAA- negative 3/10  carotid doppler screen neg 3/10  heel dexa T score -1.0  Family History: Last updated: 01/28/2010 father lung ca mother HTN, colon polyps, chol, AAA, heart problems , migraines , cholesterol  brother migriane  Maunt breast ca No FH of Colon Cancer  Social History: Last updated: 10/08/2008 Never Smoked she is an Librarian, academic of human resources at [employer], she has 2 children, she does not drink alcohol, is married helpint care for mother with AAA surgery   Review of Systems       per HPI  Physical Exam  General:  overweight but generally well appearing  Head:  normocephalic, atraumatic, and no abnormalities observed.  no sinus tenderness  Eyes:  vision grossly intact, pupils equal, pupils round, pupils reactive to light, and no  injection.  R fundus without papilledema, unable to appreciate L fundus well Ears:  R ear normal and L ear normal.   Nose:  nares clear Mouth:  pharynx pink and moist, no erythema, and no exudates.  no significant post nasal drip Neck:  supple with full rom and no masses or thyromegally, no JVD or carotid bruit Lungs:  Normal respiratory effort, chest expands symmetrically. Lungs are clear to auscultation, no crackles or wheezes. Heart:   Normal rate and regular rhythm. S1 and S2 normal without gallop, murmur, click, rub or other extra sounds. Msk:  No deformity or scoliosis noted of thoracic or lumbar spine.  no acute joint changes Pulses:  2+ rad pulses Extremities:  no pedal edema Neurologic:  CN 2-12 intact, station and gait intact, normal FTN, sensation/strength intact Skin:  Intact without suspicious lesions or rashes   Impression & Recommendations:  Problem # 1:  COUGH (ICD-786.2) with frontal throbbing head pain when actively coughing.  unclear etiology of cough.  not acutely infected today, so no abx. s/p ENT eval, rec treatment of GERD to help resolve cough. given seems to happen around allergy season, start flonase for possible allergic component. return if worsening or if flonase no help after 2 wk trial.  touched base with PCP.  ok to f/u with PCP on this.  neuro exam nonfocal today, no other concerning sxs for elevated ICP.  Complete Medication List: 1)  Synthroid 88 Mcg Tabs (Levothyroxine sodium) .... Take 1 tablet by mouth once a day 2)  Hydrochlorothiazide 25 Mg Tabs (Hydrochlorothiazide) .Marland Kitchen.. 1 by mouth once daily 3)  Omeprazole 20 Mg Cpdr (Omeprazole) .... One tab by mouth two times a day 4)  Multivitamins Tabs (Multiple vitamin) .... Take one by mouth once daily 5)  Viactive  .Marland KitchenMarland Kitchen. 1 by mouth three times a day 6)  Flonase 50 Mcg/act Susp (Fluticasone propionate) .... 2 squirts in each nostril daily  Patient Instructions: 1)  Lets start with intranasal steroids. 2)  I will touch base with Dr. Milinda Antis about this and see if any other recommendation to try. 3)  I will call you. Prescriptions: FLONASE 50 MCG/ACT SUSP (FLUTICASONE PROPIONATE) 2 squirts in each nostril daily  #1 x 1   Entered and Authorized by:   Eustaquio Boyden  MD   Signed by:   Eustaquio Boyden  MD on 09/09/2010   Method used:   [id number]       [address]        [place], [place]  [address]       Ph: [phone] or [phone]       Fax: [phone]   RxID:   [id number]    Orders Added: 1)  Est. Patient Level IV [42595]    Current Allergies (reviewed today): No known allergies

## 2010-09-16 NOTE — Letter (Signed)
Summary: [facility]  [facility]   Imported By: Kassie Mends 09/12/2010 08:36:17  _____________________________________________________________________  External Attachment:    Type:   Image     Comment:   External Document

## 2010-09-23 NOTE — Assessment & Plan Note (Signed)
  Review of gastrointestinal problems: 1. Routine risk for colorectal cancer. Colonoscopy September 2009 showed no precancerous polyps. Next colonoscopy September 2019. 2. GERD with ulcerative esophagitis status post EGD September 2009. No Barrett's changes no stricturing  History of Present Illness Visit Type: Follow-up Visit Primary GI MD: Rob Bunting MD Primary Provider: [name], M.D. Requesting Provider: na Chief Complaint: Cough she has seen ENT History of Present Illness:       very pleasant 65 year old woman whom I last saw several months ago. she has had a dry, hacking cough, sporadic.  Has been in the past feb, october (two years in a row).  PCP did not think it was allergy related.  Was given a z pack in the past, but not this year. She instead went to ENT.  she doubled her omeprazole about 2 weeks ago on advice of PCP.  Last week, flonase was started.    Since doubling the omeprazole she has not noticed any changes in the cough.  Flonase may have improved the cough (per patient).             Current Medications (verified): 1)  Synthroid 88 Mcg Tabs (Levothyroxine Sodium) .... Take 1 Tablet By Mouth Once A Day 2)  Hydrochlorothiazide 25 Mg Tabs (Hydrochlorothiazide) .Marland Kitchen.. 1 By Mouth Once Daily 3)  Omeprazole 20 Mg  Cpdr (Omeprazole) .... One Tab By Mouth Two Times A Day 4)  Multivitamins  Tabs (Multiple Vitamin) .... Take One By Mouth Once Daily 5)  Viactive .Marland Kitchen.. 1 By Mouth Three Times A Day 6)  Flonase 50 Mcg/act Susp (Fluticasone Propionate) .... 2 Squirts in Each Nostril Daily  Allergies (verified): No Known Drug Allergies  Vital Signs:  Patient profile:   65 year old female Height:      66 inches Weight:      189.8 pounds BMI:     30.75 Pulse rate:   70 / minute Pulse rhythm:   regular BP sitting:   122 / 80  (left arm) Cuff size:   regular  Vitals Entered By: Harlow Mares CMA Duncan Dull) (September 15, 2010 3:43 PM)  Physical Exam  Additional Exam:   Constitutional: generally well appearing Psychiatric: alert and oriented times 3 Abdomen: soft, non-tender, non-distended, normal bowel sounds    Impression & Recommendations:  Problem # 1:   GERD  it is not clear to me that acid is playing a role in her cough. it is quite interesting that her cough occurs in February and October only, both of these the past 2 years. her fibrosis and more classic GERD symptoms have not changed at all on once daily proton pump inhibitor.   to be safe I think it is reasonable for her to stay on twice daily proton pump inhibitor for the next 6 weeks and then back down again to once daily.  Patient Instructions: 1)  Stay on prilosec, twice a day for another 6 weeks then back down to once daily. 2)  A copy of this information will be sent to Dr. Milinda Antis. 3)  The medication list was reviewed and reconciled.  All changed / newly prescribed medications were explained.  A complete medication list was provided to the patient / caregiver.

## 2010-11-18 NOTE — Op Note (Signed)
Rebecca, Kramer            ACCOUNT NO.:  [id number]   MEDICAL RECORD NO.:  [id number]          PATIENT TYPE:  AMB   LOCATION:  [facility]                          FACILITY:  [facility]   PHYSICIAN:  Katy Fitch. Sypher, M.D. DATE OF BIRTH:  1946/02/03   DATE OF PROCEDURE:  06/17/2007  DATE OF DISCHARGE:                               OPERATIVE REPORT   PREOPERATIVE DIAGNOSES:  Painful left index finger distal  interphalangeal joint with x-ray evidence of advanced degenerative  arthritis and multiple osteochondral loose bodies.   POSTOPERATIVE DIAGNOSES:  Painful left index finger distal  interphalangeal joint with x-ray evidence of advanced degenerative  arthritis and multiple osteochondral loose bodies.   OPERATION:  Removal of multiple osteochondral loose bodies left index  finger DIP joint with debridement of marginal osteophytes on the base of  distal phalanx and middle phalangeal head.   SURGEON:  Josephine Igo, MD.   ASSISTANT:  Annye Rusk, PA-C.   ANESTHESIA:  2% lidocaine metacarpal head level block of left index  finger supplemented by IV sedation, supervising anesthesiologist is Dr.  Gypsy Balsam.   INDICATIONS:  Rebecca Kramer is a 65 year old woman referred through  the courtesy of Dr. [name] of [facility] for evaluation  and management of a painful, swollen, left index finger DIP joint.   She has a history of osteoarthrosis.  Inspection of her hand revealed a  prominent bump on the radial palmar aspect of her left index finger DIP  joint consistent with an osteochondral loose body.  She had large  Heberden's nodes.   X-ray of the finger documented advanced degenerative arthritis with loss  of the hyaline articular cartilage joint space and a large osteochondral  loose body that had extruded from the palmar radial aspect of the DIP  joint.  This was irritating her radial neurovascular bundle.   We recommended a left index finger DIP arthrotomy with  debridement of  the osteophytes, irrigation the joint and removal of the extruded  osteochondral loose body.   After informed consent, she is brought to the operating room at this  time.  Preoperatively she was advised that we could not impact the  natural history of her osteoarthritis with this intervention; however,  our goal is to relieve her pain, improved her sensibility and try to  palliate the predicament she is in at this point in time.   DESCRIPTION OF PROCEDURE:  Rebecca Kramer is brought to the operating  room and placed in supine position on the operating table.  Following an  anesthesia consult with Dr. Gypsy Balsam, local anesthesia at the metacarpal  head level was recommended with IV sedation.   She was brought to room 6, placed in the supine position on the  operating table and under Dr. Burnett Corrente strict supervision sedation  provided.   The left index finger was anesthetized with 2% lidocaine at the  metacarpal head level.  The arm was then prepped with Betadine soap  solution and sterilely draped.  The index finger was exsanguinated with  a gauze wrap and a 1/2-inch Penrose drain placed at the base of the  finger  as a digital tourniquet.   Curvilinear incisions were fashioned on the radial and ulnar aspect of  the DIP joint.  The radial incision was fashioned to allow exposure of  the radial neurovascular bundle.  This was gently retracted in a palmar  and radial direction, followed by use of a Henner elevator to extirpate  the extruded osteochondral loose body.   Multiple small cutaneous vessels in the region of the incision were  electrocauterized with bipolar current.  A limited synovectomy was  performed at the position where the osteochondral loose body had  extruded.   The DIP joint was then opened through a dorsal radial and dorsal ulnar  approach with a second incision being fashioned on the ulnar aspect of  finger.  With care, the marginal osteophytes at the  base of the distal  phalanx and the head of the middle phalanx were resected with a micro  rongeur and a House curette.   The wound was then thoroughly irrigated with irrigation of the joint to  remove all cartilaginous fragments.   The skin incisions were then repaired with simple suture of 5-0 nylon. A  compressive dressing was applied with Xeroflo sterile gauze and Coban.   For aftercare Ms. Roughton will use Vicodin 5 mg one p.o. q.4-6  hours  p.r.n. pain. She was provided 20 tablets without refill.  Also Keflex  500 mg one p.o. q.8 h x4 days as a prophylactic antibiotic.      Katy Fitch Sypher, M.D.  Electronically Signed     RVS/MEDQ  D:  06/17/2007  T:  06/18/2007  Job:  [id number]   cc:   Marne A. Milinda Antis, MD

## 2010-11-21 NOTE — Assessment & Plan Note (Signed)
[facility]                               PULMONARY OFFICE NOTE   Rebecca Kramer, Rebecca Kramer                   MRN:          [id number]  DATE:03/23/2006                            DOB:          [birth year]    HISTORY OF PRESENT ILLNESS:  The patient is a very pleasant 65 year old  white female who I have been asked to see for obstructive sleep apnea.  She  recently underwent a split-night study on January 18, 2006, where she was found  to have 94 obstructive events in the first 127 minutes of sleep.  This gave  her a respiratory disturbance index of 45 events per hour during the first  half of the study and she was ultimately titrated to 14 cm of water pressure  for control of her obstructive events and snoring.  The patient had the  study done because she was feeling tired and somewhat unrested in the  mornings.  However, this has had very little impact on her perception of her  quality of life or her daytime alertness.  The patient typically goes to bed  at 10 p.m. and gets up at 6:30 a.m. to start her day.  She will occasionally  take half of a generic Ambien p.r.n. to help with sleep onset.  The patient  has been noted to have loud snoring according to the husband but no pauses  in her breathing during sleep.  She has had no choking arouses.  She admits  to being not totally rested in the mornings whenever she arises; however,  denies any alertness issues during the day.  She works in the Administrator at [employer].  In fact, her Epworth sleepiness score is only 2,  which is quite low.  She denies any issue in the evening while watching TV  or movies and has no sleepiness with driving.  Of note, her weight is up 10  pounds over the last 2 years.   PAST MEDICAL HISTORY:  Significant for:  1. Partial hysterectomy in 1994.  2. History of hypothyroidism for which she is on Synthroid.  3. Levels are being followed consistently by Dr.  Milinda Antis.   MEDICATIONS:  1. Levothyroxine 88 mcg daily.  2. Generic Ambien one-half tablet p.r.n.   She has no known drug allergies.   SOCIAL HISTORY:  The patient is married and has children.  She lives with  her husband.  She does not smoke.   FAMILY HISTORY:  Noncontributory.   REVIEW OF SYSTEMS:  As per history of present illness.  Also, see patient  intake form documented in the chart.   PHYSICAL EXAMINATION:  GENERAL:  She is a well-developed white female in no  acute distress.  VITAL SIGNS:  Blood pressure 130/80, pulse 76, temperature is 98, weight is  172 pounds.  She is 5 feet 6 inches tall.  O2 saturation on room air is 93%.  HEENT:  Pupils equal, round, and reactive to light and accommodation.  Extraocular muscles are intact.  Nares are slightly narrowed on the left.  Oropharynx shows mild elongation of  the soft palate but a normal uvula.  NECK:  Supple without JVD or lymphadenopathy.  There is no palpable  thyromegaly.  CHEST:  Totally clear.  CARDIAC:  Reveals regular rate and rhythm.  No murmurs, rubs, or gallops.  ABDOMEN:  Soft, nontender, with good bowel sounds.  GENITAL, RECTAL, BREAST:  Exam not done and not indicated.  LOWER EXTREMITIES:  Without edema, good pulses distally.  NEUROLOGIC:  She is alert and oriented with no obvious observable motor  defects.   IMPRESSION:  Severe obstructive sleep apnea noted on a split-night study  with good response to CPAP at a pressure between 12 and 14 cm of water.  It  is very interesting that the patient has very little symptoms of daytime  sleepiness or alertness issues.  She does feel that she is not rested as she  should be in the mornings and does note some fatigue during the day.  I have  gone over the patient's study in detail with her and answered all of her  questions.  I have explained to her that we treat sleep apnea primarily for  quality of life issues and also for cardiovascular health.  Since the  patient  is not overly symptomatic it may not be unreasonable to take 6-12  months and work on losing weight.  However, if she feels that her quality of  life is being adversely affected, CPAP while trying to lose weight would be  the best treatment option.   PLAN:  The patient is to decide about weight loss alone for 6-12 months  versus CPAP coupled with weight loss.  She is going to go home and talk with  her husband about this and get back with me.                                   Barbaraann Share, MD,FCCP   [facility]  DD:  03/23/2006  DT:  03/24/2006  Job #:  [id number]   cc:   Marne A. Milinda Antis, MD

## 2010-11-21 NOTE — Op Note (Signed)
[facility]  Patient:    PAIDYN, MCFERRAN                  MRN: [id number] Proc. Date: 09/07/00 Adm. Date:  [id number] Attending:  Susa Day                           Operative Report  PREOPERATIVE DIAGNOSIS:  Mass, right thumb pulp with symptoms consistent with compression of radial proper digital nerve, right thumb.  POSTOPERATIVE DIAGNOSIS:  Probable chondroma from palmar aspect of interphalangeal joint/flexor sheath, right thumb.  OPERATION PERFORMED:  Excisional biopsy of cartilaginous mass from flexor sheath/radial aspect of interphalangeal joint capsule, right palm pulp.  SURGEON:  Katy Fitch. Sypher, Montez Hageman., M.D.  ASSISTANT:  Jonni Sanger, P.A.  ANESTHESIA:  Axillary block.  SUPERVISING ANESTHESIOLOGIST:  Dr. Katrinka Blazing.  INDICATIONS FOR PROCEDURE:  The patient is a 65 year old, right hand dominant woman who had noted a firm mass develop under her right thumb several years ago.  She had observed this and tolerated it until she began to experience neuritic type pain.  She became concerned when it increased in size and sought a hand surgery consult for diagnosis and excisional biopsy.  DESCRIPTION OF PROCEDURE:  Charleen Madera was brought to the operating room and placed in supine position on the operating table.  Following placement of axillary block in the holding area, anesthesia was satisfactory in the right arm.  The arm was then prepped with Betadine soap and solution and sterilely draped.  Following exsanguination of the limb with an Esmarch bandage, the arterial tourniquet was inflated to 230 mmHg.  The procedure commenced with a Brunner zigzag incision exposing the flexor sheath and radial neurovascular bundle over the pulp and proximal phalangeal segment of the right thumb.  The radial neurovascular bundle was split by the mass with the artery on the radial aspect and the nerve on the ulnar aspect.   Terminal branches of the proper digital nerve were tented over the mass.  These were gently retracted with a Therapist, nutritional and a blunt right angle retractor.  The mass was circumferentially dissected and followed to the radial aspect of the IP joint capsule deep to the radial proper digital artery.  This was released with iris scissors and a rongeur was used to clear the stalk of the mass.  Bleeding points were electrocauterized with bipolar cautery.  Care was taken to protect the radial proper digital nerve throughout dissection.  The wound was then repaired with interrupted sutures of 5-0 nylon.  A compressive dressing was applied with Xeroflo, sterile gauze and Coban. There were no apparent complications. The tourniquet was released and immediate capillary refill was noted in the fingers and thumb.  The patient was transferred to the recovery room with stable vital signs.  She will be discharged with a prescription for Tylenol with codeine #3 one or two tablets p.o. q.4-6h. p.r.n. pain, 20 tablets without refill. DD:  09/07/00 TD:  09/07/00 Job: [id number]

## 2010-11-21 NOTE — Procedures (Signed)
Rebecca Kramer, REAUME NO.:  [id number]   MEDICAL RECORD NO.:  [id number]          PATIENT TYPE:  OUT   LOCATION:  SLEEP CENTER                 FACILITY:  [facility]   PHYSICIAN:  Marcelyn Bruins, M.D. [facility] DATE OF BIRTH:  May 06, 1946   DATE OF STUDY:  01/18/2006                              NOCTURNAL POLYSOMNOGRAM   REFERRING PHYSICIAN:  Dr. Idamae Schuller A. Tower.   .  INDICATION FOR STUDY:  Hypersomnia with sleep apnea.   EPWORTH SCORE:  4.   SLEEP ARCHITECTURE:  The patient had a total sleep time of 284 minutes with  no slow wave sleep and adequate quantity of REM.  Sleep onset latency was  normal and REM onset was prolonged at 151 minutes.  Sleep efficiency was  very decreased at 65%.   RESPIRATORY DATA:  The patient underwent a split night protocol where she  was found to have 94 obstructive events in the first 127 minutes of sleep.  This gave her a respiratory disturbance index of 45 events per hour during  the first half of the night.  Events were not positional but moderate  snoring was noted throughout.  The patient was then placed on a medium  ResMed Quattro full-face mask and was ultimately titrated to a final  pressure of 14 cm with excellent control of both her obstructive events and  snoring.   OXYGEN DATA:  The patient had O2 desaturation as low as 81% with her  obstructive events.   CARDIAC DATA:  Rare PACs were noted.   MOVEMENT/PARASOMNIA:  The patient was found to have 42 leg jerks with only 3  resulting in arousal or awakening.   IMPRESSION/RECOMMENDATIONS:  Severe obstructive sleep apnea/hypopnea  syndrome with a respiratory disturbance index of 45 events per hour during  the first half of the night and O2 desaturation as low as 81%.  The  patient was then placed on C-PAP with a medium ResMed Quattro full-face mask  and titrated ultimately to a final pressure 14 cm of water with excellent  clinical response.           ______________________________  Marcelyn Bruins, M.D. [facility]  Diplomate, American Board of Sleep  Medicine     KC/MEDQ  D:  01/26/2006 16:20:37  T:  01/26/2006 22:17:52  Job:  [id number]

## 2010-12-17 ENCOUNTER — Ambulatory Visit: Payer: Self-pay | Admitting: Pulmonary Disease

## 2010-12-18 ENCOUNTER — Ambulatory Visit (INDEPENDENT_AMBULATORY_CARE_PROVIDER_SITE_OTHER): Payer: BC Managed Care – PPO | Admitting: Family Medicine

## 2010-12-18 ENCOUNTER — Encounter: Payer: Self-pay | Admitting: Family Medicine

## 2010-12-18 VITALS — BP 122/80 | HR 64 | Temp 98.8°F | Wt 189.8 lb

## 2010-12-18 DIAGNOSIS — L255 Unspecified contact dermatitis due to plants, except food: Secondary | ICD-10-CM

## 2010-12-18 DIAGNOSIS — L237 Allergic contact dermatitis due to plants, except food: Secondary | ICD-10-CM | POA: Insufficient documentation

## 2010-12-18 MED ORDER — PREDNISONE 20 MG PO TABS: ORAL_TABLET | ORAL | Status: DC

## 2010-12-18 MED ORDER — TRIAMCINOLONE ACETONIDE 0.1 % EX CREA: TOPICAL_CREAM | Freq: Three times a day (TID) | CUTANEOUS | Status: DC

## 2010-12-18 NOTE — Assessment & Plan Note (Signed)
D/w pt about topical vs systemic steroids.  Start with topical, then proceed to oral if not improved.  Skin and GI cautions given.  She understood.  Okay for outpatient fu.

## 2010-12-18 NOTE — Progress Notes (Signed)
Poison ivy. Was clearing the yard.  Itchy rash.  Red rash/patches on L leg, R leg.  Larger patches on and back.  All are getting larger.  No help with caladryl and baking soda, oral benadryl.  Sx started 2.5 weeks ago. No resp sx.  No FCNAV.  Asking for advice.   Meds, vitals, and allergies reviewed.   ROS: See HPI.  Otherwise, noncontributory.  ncat Skin with blanching erythematous macular patches with vesicles noted on arms, legs, back.

## 2010-12-18 NOTE — Patient Instructions (Signed)
I would use the cream and if you are still itching, start the the prednisone orally.  Take it with food.  Take care.  This should get better.

## 2010-12-26 ENCOUNTER — Ambulatory Visit (INDEPENDENT_AMBULATORY_CARE_PROVIDER_SITE_OTHER): Payer: BC Managed Care – PPO | Admitting: Pulmonary Disease

## 2010-12-26 ENCOUNTER — Encounter: Payer: Self-pay | Admitting: Pulmonary Disease

## 2010-12-26 VITALS — BP 136/74 | HR 69 | Temp 98.0°F | Ht 66.0 in | Wt 192.6 lb

## 2010-12-26 DIAGNOSIS — G4733 Obstructive sleep apnea (adult) (pediatric): Secondary | ICD-10-CM

## 2010-12-26 NOTE — Assessment & Plan Note (Signed)
The pt is doing better with cpap, and feels it has indeed helped her sleep and daytime alertness.  Her compliance has been increasing, and she denies any specific complaints with the device.  I have asked her to stay on cpap, and to work on weight reduction.  Will see her back in one year.

## 2010-12-26 NOTE — Progress Notes (Signed)
  Subjective:    Patient ID: Rebecca Kramer, female    DOB: Jul 02, 1946, 65 y.o.   MRN: [id number]  HPI The pt comes in today for f/u of her known osa.  She has gotten use to the cpap machine, and is wearing more compliantly.  She feels it has helped her sleep and daytime alertness.  She denies any issues with nasal pillows, and currently is unsure if she is on auto or fixed pressure.  Her optimal pressure is 10cm.     Review of Systems  Constitutional: Negative for fever and unexpected weight change.  HENT: Negative for ear pain, nosebleeds, congestion, sore throat, rhinorrhea, sneezing, trouble swallowing, dental problem, postnasal drip and sinus pressure.   Eyes: Negative for redness and itching.  Respiratory: Positive for cough. Negative for chest tightness, shortness of breath and wheezing.   Cardiovascular: Negative for palpitations and leg swelling.  Gastrointestinal: Negative for nausea and vomiting.  Genitourinary: Negative for dysuria.  Musculoskeletal: Negative for joint swelling.  Skin: Positive for rash.  Neurological: Negative for headaches.  Hematological: Does not bruise/bleed easily.  Psychiatric/Behavioral: Negative for dysphoric mood. The patient is not nervous/anxious.        Objective:   Physical Exam Ow female in nad No skin breakdown or pressure necrosis from cpap mask LE without edema, no cyanosis noted.  Alert, does not appear sleepy, moves all 4        Assessment & Plan:

## 2010-12-26 NOTE — Patient Instructions (Signed)
Continue with cpap either on a fixed pressure of 10cm or on auto mode (whichever is more comfortable for you). Work on weight loss Please call us if questions about your cpap, or if you feel you are not getting the service you need from [facility]. followup with me in one year, or call sooner if you lose significant weight.

## 2011-02-09 ENCOUNTER — Telehealth: Payer: Self-pay | Admitting: Pulmonary Disease

## 2011-02-09 NOTE — Telephone Encounter (Signed)
PATIENT RETURNED CALL PLEASE CALL BACK

## 2011-02-09 NOTE — Telephone Encounter (Signed)
LMTCB

## 2011-02-10 ENCOUNTER — Ambulatory Visit (INDEPENDENT_AMBULATORY_CARE_PROVIDER_SITE_OTHER): Payer: BC Managed Care – PPO | Admitting: Family Medicine

## 2011-02-10 ENCOUNTER — Encounter: Payer: Self-pay | Admitting: Family Medicine

## 2011-02-10 VITALS — BP 120/86 | HR 64 | Temp 98.7°F | Wt 189.5 lb

## 2011-02-10 DIAGNOSIS — N390 Urinary tract infection, site not specified: Secondary | ICD-10-CM

## 2011-02-10 DIAGNOSIS — M549 Dorsalgia, unspecified: Secondary | ICD-10-CM

## 2011-02-10 LAB — POCT URINALYSIS DIPSTICK
Ketones, UA: NEGATIVE
Protein, UA: NEGATIVE
Spec Grav, UA: 1.005
Urobilinogen, UA: NEGATIVE

## 2011-02-10 MED ORDER — CIPROFLOXACIN HCL 500 MG PO TABS: 500.0000 mg | ORAL_TABLET | Freq: Two times a day (BID) | ORAL | Status: AC

## 2011-02-10 NOTE — Progress Notes (Signed)
SUBJECTIVE: Rebecca Kramer is a 65 y.o. female who complains of urinary frequency, urgency and dysuria x 8 days,  fever, chills, or abnormal vaginal discharge or bleeding.  Has had some dull left sided back pain past two days. Azo has helped a little with the burning.  Does not have a h/o recurrent UTIs.  Patient Active Problem List  Diagnoses  . HYPOTHYROIDISM  . PURE HYPERCHOLESTEROLEMIA  . OBSTRUCTIVE SLEEP APNEA  . HYPERTENSION  . REFLUX, ESOPHAGEAL  . ANAL OR RECTAL PAIN  . ECZEMA  . INFLAMED SEBORRHEIC KERATOSIS  . OSTEOPENIA  . COUGH  . Poison ivy   Past Medical History  Diagnosis Date  . Hypertension   . Hypothyroidism   . Diverticulosis   . GERD (gastroesophageal reflux disease)     esophagitis with ulceration  . Osteopenia   . Bone spur     Left foot / posterior  . Menopausal symptoms     hot flashes  . Sleep apnea 2007    AHI 45/hr in 2007   Past Surgical History  Procedure Date  . Abdominal hysterectomy 1983    partial/endometriosis  . Tonsillectomy   . Breast surgery 1970's    breast lumpectomy/benign  . Breast surgery 1994    breast implants/saline  . Thumb nodule     removed  . Esophagogastroduodenoscopy 03/2008    esophagitis with ulcerations/?barretts   History  Substance Use Topics  . Smoking status: Never Smoker   . Smokeless tobacco: Never Used  . Alcohol Use: No   Family History  Problem Relation Age of Onset  . Hypertension Mother   . Hyperlipidemia Mother   . Colon polyps Mother   . Heart disease Mother   . Migraines Mother   . Cancer Father     lung CA  . Migraines Brother   . Cancer Maternal Aunt     breast CA   No Known Allergies Current Outpatient Prescriptions on File Prior to Visit  Medication Sig Dispense Refill  . Calcium-Vitamin D-Vitamin K (VIACTIV PO) Take 1 tablet by mouth 3 (three) times daily.        . fluticasone (FLONASE) 50 MCG/ACT nasal spray Place 2 sprays into the nose daily as needed.       .  hydrochlorothiazide 25 MG tablet Take 25 mg by mouth daily.        Marland Kitchen levothyroxine (SYNTHROID, LEVOTHROID) 88 MCG tablet Take 88 mcg by mouth daily.        . Multiple Vitamin (MULTIVITAMIN) tablet Take 1 tablet by mouth daily.        Marland Kitchen omeprazole (PRILOSEC) 20 MG capsule Take 20 mg by mouth daily.       Marland Kitchen triamcinolone (KENALOG) 0.1 % cream Apply topically 3 (three) times daily. For itching  45 g  1   The PMH, PSH, Social History, Family History, Medications, and allergies have been reviewed in [facility], and have been updated if relevant.  OBJECTIVE:  BP 120/86  Pulse 64  Temp(Src) 98.7 F (37.1 C) (Oral)  Wt 189 lb 8 oz (85.957 kg)  Appears well, in no apparent distress.  Vital signs are normal. The abdomen is soft without tenderness, guarding, mass, rebound or organomegaly. No CVA tenderness or inguinal adenopathy noted. Urine dipstick shows positive for RBC's.    ASSESSMENT: UTI uncomplicated without evidence of pyelonephritis  PLAN: Treatment per orders - cipro 500 mg twice daily x 5 days.  Send urine for culture, also push fluids, may use Pyridium  OTC prn. Call or return to clinic prn if these symptoms worsen or fail to improve as anticipated.

## 2011-02-11 ENCOUNTER — Other Ambulatory Visit: Payer: Self-pay | Admitting: Pulmonary Disease

## 2011-02-11 DIAGNOSIS — G4733 Obstructive sleep apnea (adult) (pediatric): Secondary | ICD-10-CM

## 2011-02-11 NOTE — Telephone Encounter (Signed)
Patient returning triage's call again.

## 2011-02-11 NOTE — Telephone Encounter (Signed)
Spoke with pt and she is aware order was sent to have her set on auto mode. Pt verbalized understanding

## 2011-02-11 NOTE — Telephone Encounter (Signed)
Per last OV note pt was to call back and advise if she wanted to keep fixed pressure or change to auto, pt states she wants to change to auto. July download has been received and placed in [facility] "very important folder". KC please advise. Thanks.

## 2011-02-11 NOTE — Telephone Encounter (Signed)
Let her know ok to leave on auto.  Will send an order to [facility].

## 2011-02-12 ENCOUNTER — Telehealth: Payer: Self-pay | Admitting: Pulmonary Disease

## 2011-02-12 LAB — URINE CULTURE: Colony Count: 9000

## 2011-02-12 NOTE — Telephone Encounter (Signed)
Spoke with Jill Alexanders and gave recs per [facility]. He verbalized understanding and states nothing further needed.

## 2011-02-12 NOTE — Telephone Encounter (Signed)
lmomtcb  

## 2011-02-12 NOTE — Telephone Encounter (Signed)
Spoke with Synetta Fail and she states they received the order to leave machine on auto but they need to know what range KC wants it to be set at. Please advise. Carron Curie, CMA

## 2011-02-12 NOTE — Telephone Encounter (Signed)
My usual;  5-20cm

## 2011-02-15 ENCOUNTER — Other Ambulatory Visit: Payer: Self-pay | Admitting: Family Medicine

## 2011-02-16 NOTE — Telephone Encounter (Signed)
Then will keep at twice daily Will send electronically

## 2011-02-16 NOTE — Telephone Encounter (Signed)
In centricity omeprazole 20 mg is one tab by mouth twice a day. In epic it is take one tab daily. I spoke with pt and she said when she saw Dr Christella Hartigan in the spring he told pt depending on her symptoms to take 1 tablet by mouth once or twice daily depending on her need.Please advise.

## 2011-03-04 ENCOUNTER — Other Ambulatory Visit: Payer: Self-pay | Admitting: Family Medicine

## 2011-03-04 NOTE — Telephone Encounter (Signed)
[facility] request refill for HCTZ 25mg  #90 x 0 with note attached pt needs to call for appt.

## 2011-04-06 ENCOUNTER — Other Ambulatory Visit: Payer: Self-pay | Admitting: Family Medicine

## 2011-04-06 DIAGNOSIS — Z1231 Encounter for screening mammogram for malignant neoplasm of breast: Secondary | ICD-10-CM

## 2011-04-13 LAB — BASIC METABOLIC PANEL
BUN: 16
CO2: 28
Chloride: 103
Creatinine, Ser: 0.83
Glucose, Bld: 86

## 2011-04-21 ENCOUNTER — Encounter: Payer: Self-pay | Admitting: Family Medicine

## 2011-04-21 ENCOUNTER — Ambulatory Visit (INDEPENDENT_AMBULATORY_CARE_PROVIDER_SITE_OTHER): Payer: BC Managed Care – PPO | Admitting: Family Medicine

## 2011-04-21 VITALS — BP 110/78 | HR 72 | Temp 98.2°F | Ht 66.0 in | Wt 187.0 lb

## 2011-04-21 DIAGNOSIS — J40 Bronchitis, not specified as acute or chronic: Secondary | ICD-10-CM

## 2011-04-21 MED ORDER — ALBUTEROL SULFATE HFA 108 (90 BASE) MCG/ACT IN AERS: 2.0000 | INHALATION_SPRAY | RESPIRATORY_TRACT | Status: DC | PRN

## 2011-04-21 MED ORDER — CHLORPHENIRAMINE-HYDROCODONE 8-10 MG/5ML PO LQCR: 5.0000 mL | Freq: Two times a day (BID) | ORAL | Status: DC | PRN

## 2011-04-21 MED ORDER — AZITHROMYCIN 250 MG PO TABS: ORAL_TABLET | ORAL | Status: AC

## 2011-04-21 NOTE — Assessment & Plan Note (Signed)
For 10 days with prod cough (headache with cough) and resolved fever  Rhonchi on exam / very slt reactive airways  Px zpak and proair inhaler- inst how to use tussionex for cough - aggressive control Update if worse or not improving in a week

## 2011-04-21 NOTE — Patient Instructions (Signed)
I think you have bronchitis  Drink lots of fluids  Take the zithromax as directed  tussionex for cough - is sedating - so use caution  Use the inhaler as needed for wheezing or tight chest If worse or not starting to improve in 1 week - please alert me - especially if worse cough of fever

## 2011-04-21 NOTE — Progress Notes (Signed)
Subjective:    Patient ID: Rebecca Kramer, female    DOB: June 08, 1946, 65 y.o.   MRN: [id number]  HPI Symptoms started over 10 days ago First day - had a fever all day and laid on the sofa  Some body aches and felt hot  Then productive cough with yellow and brown phlegm Raspy voice  A little wheezing when she lies down   She had a severe headache when she coughs - (she has had it for years like that )  Needs better cough control for this   Tried some ibuprofen - helped just a little   Not a lot of nasal symptoms or sore throat   No n/v/d  Patient Active Problem List  Diagnoses  . HYPOTHYROIDISM  . PURE HYPERCHOLESTEROLEMIA  . OBSTRUCTIVE SLEEP APNEA  . HYPERTENSION  . REFLUX, ESOPHAGEAL  . ANAL OR RECTAL PAIN  . ECZEMA  . INFLAMED SEBORRHEIC KERATOSIS  . OSTEOPENIA  . COUGH  . Poison ivy  . Bronchitis   Past Medical History  Diagnosis Date  . Hypertension   . Hypothyroidism   . Diverticulosis   . GERD (gastroesophageal reflux disease)     esophagitis with ulceration  . Osteopenia   . Bone spur     Left foot / posterior  . Menopausal symptoms     hot flashes  . Sleep apnea 2007    AHI 45/hr in 2007   Past Surgical History  Procedure Date  . Abdominal hysterectomy 1983    partial/endometriosis  . Tonsillectomy   . Breast surgery 1970's    breast lumpectomy/benign  . Breast surgery 1994    breast implants/saline  . Thumb nodule     removed  . Esophagogastroduodenoscopy 03/2008    esophagitis with ulcerations/?barretts   History  Substance Use Topics  . Smoking status: Never Smoker   . Smokeless tobacco: Never Used  . Alcohol Use: No   Family History  Problem Relation Age of Onset  . Hypertension Mother   . Hyperlipidemia Mother   . Colon polyps Mother   . Heart disease Mother   . Migraines Mother   . Cancer Father     lung CA  . Migraines Brother   . Cancer Maternal Aunt     breast CA   No Known Allergies Current Outpatient  Prescriptions on File Prior to Visit  Medication Sig Dispense Refill  . Calcium-Vitamin D-Vitamin K (VIACTIV PO) Take 1 tablet by mouth 3 (three) times daily.        . hydrochlorothiazide 25 MG tablet TAKE 1 TABLET BY MOUTH EVERY DAY  90 tablet  0  . levothyroxine (SYNTHROID, LEVOTHROID) 88 MCG tablet Take 88 mcg by mouth daily.        . Multiple Vitamin (MULTIVITAMIN) tablet Take 1 tablet by mouth daily.        Marland Kitchen triamcinolone (KENALOG) 0.1 % cream Apply topically 3 (three) times daily. For itching  45 g  1  . fluticasone (FLONASE) 50 MCG/ACT nasal spray Place 2 sprays into the nose daily as needed.            Review of Systems Review of Systems  Constitutional: Negative for fever, appetite change, fatigue and unexpected weight change.  Eyes: Negative for pain and visual disturbance.  Respiratory: pos for prod cough with some wheeze, no sob  ENT pos for hoarseness/ no ear pain or sinus pain   Cardiovascular: Negative for cp or palpitations    Gastrointestinal: Negative for nausea,  diarrhea and constipation.  Genitourinary: Negative for urgency and frequency.  Skin: Negative for pallor or rash   Neurological: Negative for weakness, light-headedness, numbness and headaches.  Hematological: Negative for adenopathy. Does not bruise/bleed easily.  Psychiatric/Behavioral: Negative for dysphoric mood. The patient is not nervous/anxious.          Objective:   Physical Exam  Constitutional: She appears well-developed and well-nourished. No distress.  HENT:  Head: Normocephalic and atraumatic.  Right Ear: External ear normal.  Left Ear: External ear normal.       Nares are boggy and slt injected Some post nasal drip that is clear No throat redness  Eyes: Conjunctivae and EOM are normal. Pupils are equal, round, and reactive to light. Right eye exhibits no discharge. Left eye exhibits no discharge.  Neck: Normal range of motion. Neck supple. No JVD present.  Cardiovascular: Normal  rate, regular rhythm and normal heart sounds.   Pulmonary/Chest: Effort normal. No respiratory distress. She has wheezes. She has no rales. She exhibits no tenderness.       Scattered rhonchi throughout  Scant wheeze on forced exp only  No rales  No sob  Lymphadenopathy:    She has no cervical adenopathy.  Neurological: She is alert.  Skin: Skin is warm and dry. No rash noted. No erythema. No pallor.  Psychiatric: She has a normal mood and affect.          Assessment & Plan:

## 2011-05-04 ENCOUNTER — Telehealth: Payer: Self-pay | Admitting: Family Medicine

## 2011-05-04 ENCOUNTER — Ambulatory Visit: Payer: BC Managed Care – PPO

## 2011-05-04 MED ORDER — CHLORPHENIRAMINE-HYDROCODONE 8-10 MG/5ML PO LQCR: 5.0000 mL | Freq: Two times a day (BID) | ORAL | Status: DC | PRN

## 2011-05-04 NOTE — Telephone Encounter (Signed)
Calling because she was seen on 16th with bronchitis and can't sleep.  She would like to know if she should come back in or needs refills.  Patient call back is [phone]

## 2011-05-04 NOTE — Telephone Encounter (Signed)
Please call in another round of tussionex for cough -- will put under meds  If fever/sob/other symptoms update me If not improving by end of the week f/u

## 2011-05-04 NOTE — Telephone Encounter (Signed)
Spoke to patient and was advised that she can not sleep because of the cough. Patient states that she has finished all of the medication given for her cough, no fever and her head hurts really bad when she coughs. [facility]acy [facility]

## 2011-05-05 NOTE — Telephone Encounter (Signed)
Patient notified as instructed by telephone.Medication phoned to [facility] pharmacy as instructed.  

## 2011-05-12 ENCOUNTER — Encounter (HOSPITAL_BASED_OUTPATIENT_CLINIC_OR_DEPARTMENT_OTHER): Payer: Self-pay | Admitting: *Deleted

## 2011-05-13 ENCOUNTER — Encounter (HOSPITAL_BASED_OUTPATIENT_CLINIC_OR_DEPARTMENT_OTHER): Payer: Self-pay | Admitting: *Deleted

## 2011-05-13 ENCOUNTER — Encounter (HOSPITAL_BASED_OUTPATIENT_CLINIC_OR_DEPARTMENT_OTHER)
Admission: RE | Admit: 2011-05-13 | Discharge: 2011-05-13 | Disposition: A | Discharge status: Home / Self Care | Payer: BC Managed Care – PPO | Source: Ambulatory Visit | Attending: Orthopedic Surgery | Admitting: Orthopedic Surgery

## 2011-05-13 ENCOUNTER — Other Ambulatory Visit: Payer: Self-pay

## 2011-05-13 ENCOUNTER — Other Ambulatory Visit: Payer: Self-pay | Admitting: Orthopedic Surgery

## 2011-05-13 LAB — POCT I-STAT, CHEM 8
BUN: 18 mg/dL (ref 6–23)
Chloride: 102 mEq/L (ref 96–112)
Creatinine, Ser: 0.9 mg/dL (ref 0.50–1.10)
Glucose, Bld: 108 mg/dL — ABNORMAL HIGH (ref 70–99)
Hemoglobin: 15.6 g/dL — ABNORMAL HIGH (ref 12.0–15.0)
Potassium: 3.5 mEq/L (ref 3.5–5.1)
Sodium: 136 mEq/L (ref 135–145)

## 2011-05-14 ENCOUNTER — Encounter (HOSPITAL_BASED_OUTPATIENT_CLINIC_OR_DEPARTMENT_OTHER): Payer: Self-pay | Admitting: Anesthesiology

## 2011-05-14 ENCOUNTER — Ambulatory Visit (HOSPITAL_BASED_OUTPATIENT_CLINIC_OR_DEPARTMENT_OTHER)
Admission: RE | Admit: 2011-05-14 | Discharge: 2011-05-14 | Disposition: A | Discharge status: Home / Self Care | Payer: BC Managed Care – PPO | Source: Ambulatory Visit | Attending: Orthopedic Surgery | Admitting: Orthopedic Surgery

## 2011-05-14 ENCOUNTER — Ambulatory Visit (HOSPITAL_BASED_OUTPATIENT_CLINIC_OR_DEPARTMENT_OTHER): Payer: BC Managed Care – PPO | Admitting: Anesthesiology

## 2011-05-14 ENCOUNTER — Encounter (HOSPITAL_BASED_OUTPATIENT_CLINIC_OR_DEPARTMENT_OTHER)
Admission: RE | Disposition: A | Discharge status: Home / Self Care | Payer: Self-pay | Source: Ambulatory Visit | Attending: Orthopedic Surgery

## 2011-05-14 ENCOUNTER — Encounter (HOSPITAL_BASED_OUTPATIENT_CLINIC_OR_DEPARTMENT_OTHER): Payer: Self-pay | Admitting: *Deleted

## 2011-05-14 DIAGNOSIS — G4730 Sleep apnea, unspecified: Secondary | ICD-10-CM | POA: Insufficient documentation

## 2011-05-14 DIAGNOSIS — M654 Radial styloid tenosynovitis [de Quervain]: Secondary | ICD-10-CM | POA: Insufficient documentation

## 2011-05-14 DIAGNOSIS — Z01810 Encounter for preprocedural cardiovascular examination: Secondary | ICD-10-CM | POA: Insufficient documentation

## 2011-05-14 DIAGNOSIS — M6740 Ganglion, unspecified site: Secondary | ICD-10-CM | POA: Insufficient documentation

## 2011-05-14 DIAGNOSIS — K219 Gastro-esophageal reflux disease without esophagitis: Secondary | ICD-10-CM | POA: Insufficient documentation

## 2011-05-14 DIAGNOSIS — I10 Essential (primary) hypertension: Secondary | ICD-10-CM | POA: Insufficient documentation

## 2011-05-14 HISTORY — DX: Unspecified osteoarthritis, unspecified site: M19.90

## 2011-05-14 HISTORY — PX: DORSAL COMPARTMENT RELEASE: SHX5039

## 2011-05-14 SURGERY — RELEASE, FIRST DORSAL COMPARTMENT, HAND
Anesthesia: General | Site: Wrist | Laterality: Right | Wound class: Clean

## 2011-05-14 MED ORDER — LACTATED RINGERS IV SOLN: 500.0000 mL | INTRAVENOUS | Status: DC

## 2011-05-14 MED ORDER — IBUPROFEN 200 MG PO TABS: 200.0000 mg | ORAL_TABLET | Freq: Four times a day (QID) | ORAL | Status: DC | PRN

## 2011-05-14 MED ORDER — LIDOCAINE-PRILOCAINE 2.5-2.5 % EX CREA: 1.0000 "application " | TOPICAL_CREAM | Freq: Once | CUTANEOUS | Status: DC

## 2011-05-14 MED ORDER — ATROPINE SULFATE 0.4 MG/ML IJ SOLN: 0.4000 mg | Freq: Once | INTRAMUSCULAR | Status: DC | PRN

## 2011-05-14 MED ORDER — DEXAMETHASONE SODIUM PHOSPHATE 4 MG/ML IJ SOLN
INTRAMUSCULAR | Status: DC | PRN
  Administered 2011-05-14: 10 mg via INTRAVENOUS

## 2011-05-14 MED ORDER — ROPIVACAINE HCL 5 MG/ML IJ SOLN
INTRAMUSCULAR | Status: DC | PRN
  Administered 2011-05-14: 25 mL via EPIDURAL

## 2011-05-14 MED ORDER — LIDOCAINE HCL 1 % IJ SOLN
INTRAMUSCULAR | Status: DC | PRN
  Administered 2011-05-14: 1 mL via INTRADERMAL

## 2011-05-14 MED ORDER — FENTANYL CITRATE 0.05 MG/ML IJ SOLN: 25.0000 ug | INTRAMUSCULAR | Status: DC | PRN

## 2011-05-14 MED ORDER — LACTATED RINGERS IV SOLN
INTRAVENOUS | Status: DC
  Administered 2011-05-14 (×3): via INTRAVENOUS

## 2011-05-14 MED ORDER — PROPOFOL 10 MG/ML IV EMUL
INTRAVENOUS | Status: DC | PRN
  Administered 2011-05-14: 180 mg via INTRAVENOUS

## 2011-05-14 MED ORDER — ONDANSETRON HCL 4 MG/2ML IJ SOLN
INTRAMUSCULAR | Status: DC | PRN
  Administered 2011-05-14: 4 mg via INTRAVENOUS

## 2011-05-14 MED ORDER — FENTANYL CITRATE 0.05 MG/ML IJ SOLN
50.0000 ug | INTRAMUSCULAR | Status: DC | PRN
  Administered 2011-05-14: 100 ug via INTRAVENOUS

## 2011-05-14 MED ORDER — TRAMADOL HCL 50 MG PO TABS: 50.0000 mg | ORAL_TABLET | Freq: Four times a day (QID) | ORAL | Status: AC | PRN

## 2011-05-14 MED ORDER — OXYMETAZOLINE HCL 0.05 % NA SOLN: 2.0000 | Freq: Once | NASAL | Status: DC

## 2011-05-14 MED ORDER — MIDAZOLAM HCL 2 MG/2ML IJ SOLN: 1.0000 mg | INTRAMUSCULAR | Status: DC | PRN

## 2011-05-14 MED ORDER — MIDAZOLAM HCL 2 MG/2ML IJ SOLN
0.5000 mg | INTRAMUSCULAR | Status: DC | PRN
  Administered 2011-05-14 (×2): 2 mg via INTRAVENOUS

## 2011-05-14 MED ORDER — METOCLOPRAMIDE HCL 5 MG/ML IJ SOLN: 10.0000 mg | Freq: Once | INTRAMUSCULAR | Status: DC | PRN

## 2011-05-14 MED ORDER — LIDOCAINE HCL (CARDIAC) 20 MG/ML IV SOLN
INTRAVENOUS | Status: DC | PRN
  Administered 2011-05-14: 60 mg via INTRAVENOUS

## 2011-05-14 MED ORDER — GLYCOPYRROLATE 0.2 MG/ML IJ SOLN: 0.2000 mg | Freq: Once | INTRAMUSCULAR | Status: DC | PRN

## 2011-05-14 MED ORDER — LACTATED RINGERS IV SOLN: INTRAVENOUS | Status: DC

## 2011-05-14 SURGICAL SUPPLY — 55 items
BANDAGE ADHESIVE 1X3 (GAUZE/BANDAGES/DRESSINGS) IMPLANT
BANDAGE ELASTIC 3 VELCRO ST LF (GAUZE/BANDAGES/DRESSINGS) ×3 IMPLANT
BANDAGE GAUZE ELAST BULKY 4 IN (GAUZE/BANDAGES/DRESSINGS) IMPLANT
BLADE MINI RND TIP GREEN BEAV (BLADE) IMPLANT
BLADE SURG 15 STRL LF DISP TIS (BLADE) ×2 IMPLANT
BLADE SURG 15 STRL SS (BLADE) ×3
BNDG CMPR 9X4 STRL LF SNTH (GAUZE/BANDAGES/DRESSINGS) ×2
BNDG COHESIVE 1X5 TAN STRL LF (GAUZE/BANDAGES/DRESSINGS) IMPLANT
BNDG ESMARK 4X9 LF (GAUZE/BANDAGES/DRESSINGS) ×2 IMPLANT
BRUSH SCRUB EZ PLAIN DRY (MISCELLANEOUS) ×3 IMPLANT
CLOTH BEACON ORANGE TIMEOUT ST (SAFETY) ×3 IMPLANT
CORDS BIPOLAR (ELECTRODE) ×2 IMPLANT
COVER MAYO STAND STRL (DRAPES) ×3 IMPLANT
COVER TABLE BACK 60X90 (DRAPES) ×3 IMPLANT
CUFF TOURNIQUET SINGLE 18IN (TOURNIQUET CUFF) ×3 IMPLANT
DECANTER SPIKE VIAL GLASS SM (MISCELLANEOUS) ×2 IMPLANT
DRAIN PENROSE 1/2X12 LTX STRL (WOUND CARE) IMPLANT
DRAIN PENROSE 1/4X12 LTX STRL (WOUND CARE) IMPLANT
DRAPE EXTREMITY T 121X128X90 (DRAPE) ×3 IMPLANT
DRAPE SURG 17X23 STRL (DRAPES) ×3 IMPLANT
DRSG TEGADERM 4X4.75 (GAUZE/BANDAGES/DRESSINGS) ×3 IMPLANT
GAUZE XEROFORM 1X8 LF (GAUZE/BANDAGES/DRESSINGS) IMPLANT
GLOVE BIO SURGEON STRL SZ 6.5 (GLOVE) ×2 IMPLANT
GLOVE BIOGEL M STRL SZ7.5 (GLOVE) ×3 IMPLANT
GLOVE EXAM NITRILE MD LF STRL (GLOVE) ×3 IMPLANT
GLOVE ORTHO TXT STRL SZ7.5 (GLOVE) ×3 IMPLANT
GOWN BRE IMP PREV XXLGXLNG (GOWN DISPOSABLE) ×4 IMPLANT
GOWN PREVENTION PLUS XLARGE (GOWN DISPOSABLE) ×3 IMPLANT
GOWN PREVENTION PLUS XXLARGE (GOWN DISPOSABLE) ×2 IMPLANT
NDL BLUNT 17GA (NEEDLE) ×1 IMPLANT
NEEDLE 27GAX1X1/2 (NEEDLE) ×2 IMPLANT
NEEDLE BLUNT 17GA (NEEDLE) IMPLANT
NS IRRIG 1000ML POUR BTL (IV SOLUTION) ×3 IMPLANT
PACK BASIN DAY SURGERY FS (CUSTOM PROCEDURE TRAY) ×3 IMPLANT
PAD CAST 3X4 CTTN HI CHSV (CAST SUPPLIES) ×1 IMPLANT
PADDING CAST ABS 4INX4YD NS (CAST SUPPLIES) ×1
PADDING CAST ABS COTTON 4X4 ST (CAST SUPPLIES) ×2 IMPLANT
PADDING CAST COTTON 3X4 STRL (CAST SUPPLIES) ×3
SLEEVE SCD COMPRESS KNEE MED (MISCELLANEOUS) IMPLANT
SPONGE GAUZE 4X4 12PLY (GAUZE/BANDAGES/DRESSINGS) ×1 IMPLANT
STOCKINETTE 4X48 STRL (DRAPES) ×3 IMPLANT
STRIP CLOSURE SKIN 1/2X4 (GAUZE/BANDAGES/DRESSINGS) ×3 IMPLANT
SUT ETHILON 5 0 P 3 18 (SUTURE)
SUT MERSILENE 4 0 P 3 (SUTURE) IMPLANT
SUT NYLON ETHILON 5-0 P-3 1X18 (SUTURE) ×1 IMPLANT
SUT PROLENE 3 0 PS 2 (SUTURE) ×3 IMPLANT
SUT VIC AB 4-0 P-3 18XBRD (SUTURE) IMPLANT
SUT VIC AB 4-0 P3 18 (SUTURE)
SYR 20CC LL (SYRINGE) ×1 IMPLANT
SYR 3ML 23GX1 SAFETY (SYRINGE) IMPLANT
SYR CONTROL 10ML LL (SYRINGE) ×3 IMPLANT
TOWEL OR 17X24 6PK STRL BLUE (TOWEL DISPOSABLE) ×6 IMPLANT
TRAY DSU PREP LF (CUSTOM PROCEDURE TRAY) ×3 IMPLANT
UNDERPAD 30X30 INCONTINENT (UNDERPADS AND DIAPERS) ×3 IMPLANT
WATER STERILE IRR 1000ML POUR (IV SOLUTION) ×3 IMPLANT

## 2011-05-14 NOTE — Anesthesia Preprocedure Evaluation (Addendum)
Anesthesia Evaluation  Patient identified by MRN, date of birth, ID band Patient awake    Reviewed: Allergy & Precautions, H&P , NPO status , Patient's Chart, lab work & pertinent test results, reviewed documented beta blocker date and time   Airway Mallampati: II TM Distance: >3 FB Neck ROM: full    Dental No notable dental hx.    Pulmonary sleep apnea ,    Pulmonary exam normal       Cardiovascular hypertension, On Medications     Neuro/Psych Negative Neurological ROS  Negative Psych ROS   GI/Hepatic Neg liver ROS, GERD-  Medicated and Controlled,  Endo/Other    Renal/GU negative Renal ROS  Genitourinary negative   Musculoskeletal   Abdominal   Peds  Hematology negative hematology ROS (+)   Anesthesia Other Findings See surgeon's H&P   Reproductive/Obstetrics negative OB ROS                           Anesthesia Physical Anesthesia Plan  ASA: II  Anesthesia Plan: General   Post-op Pain Management:    Induction: Intravenous  Airway Management Planned: LMA  Additional Equipment:   Intra-op Plan:   Post-operative Plan:   Informed Consent: I have reviewed the patients History and Physical, chart, labs and discussed the procedure including the risks, benefits and alternatives for the proposed anesthesia with the patient or authorized representative who has indicated his/her understanding and acceptance.     Plan Discussed with: CRNA and Surgeon  Anesthesia Plan Comments:        Anesthesia Quick Evaluation

## 2011-05-14 NOTE — Brief Op Note (Signed)
05/14/2011  9:17 AM  PATIENT:  Rebecca Kramer  65 y.o. female  PRE-OPERATIVE DIAGNOSIS:  stenosing tenosynovitis first dorsal compartment right wrist  POST-OPERATIVE DIAGNOSIS:  stenosing tenosynovitis first dorsal compartment right wrist  PROCEDURE:  Procedure(s): RELEASE DORSAL COMPARTMENT (DEQUERVAIN) CYST REMOVAL FROM EXTENSOR RETINACULUM  SURGEON:  Surgeon(s): Wyn Forster., MD  PHYSICIAN ASSISTANT:   ASSISTANTS: Annye Rusk, P.A.-C   ANESTHESIA:   General by LMA  EBL:  0 BLOOD ADMINISTERED:none  DRAINS: none   LOCAL MEDICATIONS USED:  XYLOCAINE 2.5 CC  SPECIMEN:  none  DISPOSITION OF SPECIMEN: N/A   COUNTS:  Correct TOURNIQUET:  * Missing tourniquet times found for documented tourniquets in log:  4891 *  DICTATION: .Other Dictation: Dictation Number [id number]  PLAN OF CARE: Discharge to home after PACU  PATIENT DISPOSITION:  PACU - hemodynamically stable.   Delay start of Pharmacological VTE agent (>24hrs) due to surgical blood loss or risk of bleeding:  {YES/NO/NOT APPLICABLE:20182

## 2011-05-14 NOTE — Anesthesia Postprocedure Evaluation (Signed)
  Anesthesia Post-op Note  Patient:  PAONE  Procedure(s) Performed:  RELEASE DORSAL COMPARTMENT (DEQUERVAIN) - right release dorsal compartment/ de quervain release with debridement of mucoid cyst  Patient Location: PACU  Anesthesia Type: GA combined with regional for post-op pain  Level of Consciousness: awake and alert   Airway and Oxygen Therapy: Patient Spontanous Breathing  Post-op Pain: none  Post-op Assessment: Post-op Vital signs reviewed, Patient's Cardiovascular Status Stable, Respiratory Function Stable, Patent Airway, No signs of Nausea or vomiting, Adequate PO intake and Pain level controlled  Post-op Vital Signs: Reviewed and stable  Complications: No apparent anesthesia complications

## 2011-05-14 NOTE — Op Note (Signed)
Dictated op note [id number] MRN:  [id number]

## 2011-05-14 NOTE — Transfer of Care (Signed)
Immediate Anesthesia Transfer of Care Note  Patient: Rebecca Kramer  Procedure(s) Performed:  RELEASE DORSAL COMPARTMENT (DEQUERVAIN) - right release dorsal compartment/ de quervain release with debridement of mucoid cyst  Patient Location: PACU  Anesthesia Type: General and GA combined with regional for post-op pain  Level of Consciousness: awake, alert  and oriented  Airway & Oxygen Therapy: Patient Spontanous Breathing and Patient connected to face mask oxygen  Post-op Assessment: Report given to PACU RN and Post -op Vital signs reviewed and stable  Post vital signs: Reviewed and stable  Complications: No apparent anesthesia complications

## 2011-05-14 NOTE — Interval H&P Note (Signed)
History and Physical Interval Note:   11/8/2012RE: Rebecca Kramer      Seen by: Katy Fitch. [name], MD   OFFICE VISIT   04-06-11    DOB: 16-Nov-2045  Rebecca Kramer came back for a follow up consult. She now has a large myxoid cyst at her first dorsal compartment of the right wrist. She also has significant osteophytes forming at multiple IP joints. She questions whether or not they can be removed.   Her past history reveals that in 2008 we removed loose bodies from the DIP joint to remove a myxoid cyst.   Her past history is updated from 2008. She is basically a healthy 65 year old woman. She has no drug allergies. Current medications include HCTZ 25 mg daily, Levothyroxine 88 micrograms daily, Omeprazole 20 mg daily, calcium, Viactiv, multivitamins and Flonase PRN. Prior surgery includes partial hysterectomy in 1983, lumpectomy in 1975.  Her social history reveals she is married. She is a nonsmoker and does not drink alcoholic beverages. Her family history is detailed and positive for hypertension and arthritis. A 14 system review of systems reveals contact lenses and history of cough.  Physical exam reveals a well appearing 65 year old woman. Inspection of her hands reveals stigmata of osteoarthritis with Heberden's and Bouchard's nodes. She has an 8 mm in diameter myxoid cyst at the apex of the first dorsal compartment on the right. She does not have a positive Finkelstein's maneuver. Her pulses and capillary refill are intact.  X-rays of her hand documents diffuse DIP osteoarthritis and thumb IP joint arthrosis.   Plan: We have advised her to proceed with resection of the cyst on an elective basis at her discretion. Injection and aspiration are not effective long term with these predicaments. This is a benign process that can be lived with as long as she chooses to do so.   With respect to her osteophytes, I have recommended that she accept them for the time being. Osteophyte debridement  often leads to stiffness and dissatisfaction.   RVS/phe  T: 04-08-11    PATIENT:  Rebecca Kramer                       DOB: [birth year]                  SEEN BY: Katy Fitch. Norvell Ureste, Montez Hageman MD   PRE-OP SURGICAL NOTE:   11.7.12  Rebecca Kramer is a 65 year-old right-hand dominant female who initially presented to our office on 10.1.12 for evaluation and treatment of a large myxoid cyst, first dorsal compartment of the right wrist.  She also has significant osteophytes forming at multiple IP joint of the same hand.   She presented to our office 04/06/11 for evaluation and treatment.    Physical exam reveals a well appearing 65 year-old female just including cardiac, lungs, head, abdomen and neuro all within normal limits except for the right upper extremity which reveals a large myxoid cyst at the first dorsal compartment of the right wrist.  She has the stigmata of osteoarthritis with both Heberden's and Bouchard's nodes. She has an 8 mm.  in diameter myxoid cyst, the apex of her right first dorsal compartment.   She has a positive Finkelstein's.  Pulses and capillary refill are intact.  She has diffuse DIP and thumb IP joint arthroses with early mucoid cysts especially of the index and long finger.    IMPRESSION:    Large myxoid cyst of first dorsal  compartment right wrist in addition to mucoid cysts with underlying osteoarthritis DIP joints of the index and long finger.  PLAN:    Rebecca Kramer wishes to proceed with surgical intervention concerning debridement of the cyst, release of the right first dorsal compartment and debridement of the DIP joints of the index and long finger.  The procedure, risks, benefits and postoperative course were discussed with the patient at length and she was in agreement with this plan.   _______________________________ Katy Fitch. Kiasha Bellin, Montez Hageman., M.D.(RD)cmf   T:  11.7.12  The H&P reviewed , the patient examined and no interval chanced noted as of 05/14/2011.   8:36  AM   Rebecca Kramer  has presented today for surgery, with the diagnosis of stenosing tenosynovitis first dorsal compartment right wrist  The various methods of treatment have been discussed with the patient and family. After consideration of risks, benefits and other options for treatment, the patient has consented to  Procedure(s): RELEASE DORSAL COMPARTMENT (DEQUERVAIN) CYST REMOVAL as a surgical intervention .  The patients' history has been reviewed, patient examined, no change in status, stable for surgery.  I have reviewed the patients' chart and labs.  Questions were answered to the patient's satisfaction.     Wyn Forster  MD

## 2011-05-14 NOTE — Anesthesia Procedure Notes (Addendum)
Anesthesia Regional Block:  Supraclavicular block  Pre-Anesthetic Checklist: ,, timeout performed, Correct Patient, Correct Site, Correct Laterality, Correct Procedure, Correct Position, site marked, Risks and benefits discussed,  Surgical consent,  Pre-op evaluation,  At surgeon's request and post-op pain management  Laterality: Right  Prep: chloraprep       Needles:   Needle Type: Other   (Arrow Echogenic)   Needle Length: 9cm  Needle Gauge: 21    Additional Needles:  Procedures: ultrasound guided Supraclavicular block Narrative:  Start time: 05/14/2011 8:09 AM End time: 05/14/2011 8:15 AM Injection made incrementally with aspirations every 5 mL.  Performed by: Personally  Anesthesiologist: Cfrederick  Interscalene brachial plexus block Performed by: Gladys Damme    Procedure Name: LMA Insertion Date/Time: 05/14/2011 9:01 AM Performed by: Gladys Damme Pre-anesthesia Checklist: Patient identified, Timeout performed, Emergency Drugs available, Suction available and Patient being monitored Patient Re-evaluated:Patient Re-evaluated prior to inductionOxygen Delivery Method: Circle System Utilized Preoxygenation: Pre-oxygenation with 100% oxygen Intubation Type: IV induction Ventilation: Mask ventilation without difficulty LMA: LMA inserted LMA Size: 4.0 Number of attempts: 1 Tube secured with: Tape Dental Injury: Teeth and Oropharynx as per pre-operative assessment    Ultrasound guidance used to: id relevant anatomy, confirm needle position, local anesthetic spread, avoidance of vascular puncture. Picture saved. No complications.

## 2011-05-14 NOTE — H&P (View-Only) (Signed)
Subjective:    Patient ID: Rebecca Kramer, female    DOB: 01/11/1946, 65 y.o.   MRN: [id number]  HPI Symptoms started over 10 days ago First day - had a fever all day and laid on the sofa  Some body aches and felt hot  Then productive cough with yellow and brown phlegm Raspy voice  A little wheezing when she lies down   She had a severe headache when she coughs - (she has had it for years like that )  Needs better cough control for this   Tried some ibuprofen - helped just a little   Not a lot of nasal symptoms or sore throat   No n/v/d  Patient Active Problem List  Diagnoses  . HYPOTHYROIDISM  . PURE HYPERCHOLESTEROLEMIA  . OBSTRUCTIVE SLEEP APNEA  . HYPERTENSION  . REFLUX, ESOPHAGEAL  . ANAL OR RECTAL PAIN  . ECZEMA  . INFLAMED SEBORRHEIC KERATOSIS  . OSTEOPENIA  . COUGH  . Poison ivy  . Bronchitis   Past Medical History  Diagnosis Date  . Hypertension   . Hypothyroidism   . Diverticulosis   . GERD (gastroesophageal reflux disease)     esophagitis with ulceration  . Osteopenia   . Bone spur     Left foot / posterior  . Menopausal symptoms     hot flashes  . Sleep apnea 2007    AHI 45/hr in 2007   Past Surgical History  Procedure Date  . Abdominal hysterectomy 1983    partial/endometriosis  . Tonsillectomy   . Breast surgery 1970's    breast lumpectomy/benign  . Breast surgery 1994    breast implants/saline  . Thumb nodule     removed  . Esophagogastroduodenoscopy 03/2008    esophagitis with ulcerations/?barretts   History  Substance Use Topics  . Smoking status: Never Smoker   . Smokeless tobacco: Never Used  . Alcohol Use: No   Family History  Problem Relation Age of Onset  . Hypertension Mother   . Hyperlipidemia Mother   . Colon polyps Mother   . Heart disease Mother   . Migraines Mother   . Cancer Father     lung CA  . Migraines Brother   . Cancer Maternal Aunt     breast CA   No Known Allergies Current Outpatient  Prescriptions on File Prior to Visit  Medication Sig Dispense Refill  . Calcium-Vitamin D-Vitamin K (VIACTIV PO) Take 1 tablet by mouth 3 (three) times daily.        . hydrochlorothiazide 25 MG tablet TAKE 1 TABLET BY MOUTH EVERY DAY  90 tablet  0  . levothyroxine (SYNTHROID, LEVOTHROID) 88 MCG tablet Take 88 mcg by mouth daily.        . Multiple Vitamin (MULTIVITAMIN) tablet Take 1 tablet by mouth daily.        . triamcinolone (KENALOG) 0.1 % cream Apply topically 3 (three) times daily. For itching  45 g  1  . fluticasone (FLONASE) 50 MCG/ACT nasal spray Place 2 sprays into the nose daily as needed.            Review of Systems Review of Systems  Constitutional: Negative for fever, appetite change, fatigue and unexpected weight change.  Eyes: Negative for pain and visual disturbance.  Respiratory: pos for prod cough with some wheeze, no sob  ENT pos for hoarseness/ no ear pain or sinus pain   Cardiovascular: Negative for cp or palpitations    Gastrointestinal: Negative for nausea,   diarrhea and constipation.  Genitourinary: Negative for urgency and frequency.  Skin: Negative for pallor or rash   Neurological: Negative for weakness, light-headedness, numbness and headaches.  Hematological: Negative for adenopathy. Does not bruise/bleed easily.  Psychiatric/Behavioral: Negative for dysphoric mood. The patient is not nervous/anxious.          Objective:   Physical Exam  Constitutional: She appears well-developed and well-nourished. No distress.  HENT:  Head: Normocephalic and atraumatic.  Right Ear: External ear normal.  Left Ear: External ear normal.       Nares are boggy and slt injected Some post nasal drip that is clear No throat redness  Eyes: Conjunctivae and EOM are normal. Pupils are equal, round, and reactive to light. Right eye exhibits no discharge. Left eye exhibits no discharge.  Neck: Normal range of motion. Neck supple. No JVD present.  Cardiovascular: Normal  rate, regular rhythm and normal heart sounds.   Pulmonary/Chest: Effort normal. No respiratory distress. She has wheezes. She has no rales. She exhibits no tenderness.       Scattered rhonchi throughout  Scant wheeze on forced exp only  No rales  No sob  Lymphadenopathy:    She has no cervical adenopathy.  Neurological: She is alert.  Skin: Skin is warm and dry. No rash noted. No erythema. No pallor.  Psychiatric: She has a normal mood and affect.          Assessment & Plan:   

## 2011-05-15 NOTE — Op Note (Signed)
[name], [name] NO.:  [id number]  MEDICAL RECORD NO.:  [id number]  LOCATION:                                 FACILITY:  PHYSICIAN:  Rebecca Fitch. Sharlyne Koeneman, M.D.      DATE OF BIRTH:  DATE OF PROCEDURE:  05/14/2011 DATE OF DISCHARGE:                              OPERATIVE REPORT   PREOPERATIVE DIAGNOSIS:  Chronic pain right wrist and first dorsal compartment with stenosing tenosynovitis, and 8 mm in diameter myxoid cyst of extensor retinaculum.  POSTOPERATIVE DIAGNOSIS:  Chronic pain right wrist and first dorsal compartment with stenosing tenosynovitis, and 8 mm in diameter myxoid cyst of extensor retinaculum.  OPERATION: 1. Resection of myxoid cyst from extensor retinaculum, first dorsal     compartment. 2. Release of first dorsal compartment, an inventory of extensor     tendons demonstrating 2 slips of the abductor pollicis longus and     single slip of extensor pollicis brevis.  OPERATING SURGEON:  Rebecca Fitch. Ameah Chanda, MD  ASSISTANT:  Marveen Reeks Dasnoit, PAC  ANESTHESIA:  General by LMA supplemented by a plexus block.  SUPERVISING ANESTHESIOLOGIST:  Janetta Hora. Gelene Mink, MD.  INDICATIONS:  Rebecca Kramer is a 65 year old woman referred for evaluation and management of a painful right wrist.  She had a history of background osteoarthritis.  She requested debridement of osteophytes for interphalangeal joints; however, during a detailed informed consent, we advised her that it was not in her best interest to do so.  She did not have myxoid cyst and did not have nail deformities.  Examination of the right wrist demonstrated swelling of the first dorsal compartment and a myxoid cyst measuring 8 mm in diameter at the apex of her compartment, markedly positive Finkelstein maneuver, and pain with ulnar deviation of the wrist.  We advised proceeding with release of the first dorsal compartment and cyst excision.  The anticipated advantages of surgery include  pain relief, relief of her deformity, and improved hand and wrist function. Potential complications would include infection, neurovascular injury, failure to relieve all her pain, and the possible recurrence of the cyst at a later date.  After informed consent, she was brought to the operating room  at this time.  PROCEDURE IN DETAIL:  Rebecca Kramer is brought to room 1 of the [facility] and placed in supine position on the operating table. Following a plexus block placed in the holding area for perioperative comfort by Dr. Gelene Mink, excellent anesthesia of the right upper extremity was noted.  Following a routine surgical site identification protocol, her wrist was identified and initialed.  Under Dr. Thornton Dales strict supervision in room 1, general anesthesia by LMA technique was induced followed by routine Betadine scrub and paint of the right upper extremity.  Following routine surgical time- out, procedure commenced with exsanguination of left arm with Esmarch bandage and inflation of arterial tourniquet to 225 mmHg.  The surgery commenced with a short 1-cm oblique incision directly over the cyst.  Subcutaneous tissues were carefully divided taking care to gently retract the radial superficial sensory branches.  A Freer elevator was used to clear soft tissues from the compartment.  A rongeur was used to piecemeal remove  the entire cyst.  The first dorsal compartment was then split with scalpel and scissors.  The contents of the compartment were inventoried with identification of a single slip of the extensor pollicis brevis and two slips of the abductor pollicis longus.  There was no septum.  The wound was then inspected for bleeding points.  The skin was repaired with intradermal 3-0 Prolene and Steri-Strips;  2.5 mL of 2% lidocaine were infiltrated for postoperative analgesia.  For aftercare, Rebecca Kramer is placed in a compressive dressing with sterile gauze  and Ace wrap.  She will begin immediate range of motion exercises.  For aftercare, she will return to our office for followup in 1 week.  She is provided prescription for tramadol 50 mg 1 p.o. q.4-6 hours p.r.n. pain, 20 tablets without refill.     Rebecca Kramer, M.D.     RVS/MEDQ  D:  05/14/2011  T:  05/14/2011  Job:  [id number]

## 2011-05-18 ENCOUNTER — Encounter (HOSPITAL_BASED_OUTPATIENT_CLINIC_OR_DEPARTMENT_OTHER): Payer: Self-pay | Admitting: Orthopedic Surgery

## 2011-05-20 ENCOUNTER — Ambulatory Visit
Admission: RE | Admit: 2011-05-20 | Discharge: 2011-05-20 | Disposition: A | Discharge status: Home / Self Care | Payer: BC Managed Care – PPO | Source: Ambulatory Visit | Attending: Family Medicine | Admitting: Family Medicine

## 2011-05-20 DIAGNOSIS — Z1231 Encounter for screening mammogram for malignant neoplasm of breast: Secondary | ICD-10-CM

## 2011-06-01 ENCOUNTER — Encounter: Payer: Self-pay | Admitting: *Deleted

## 2011-06-07 ENCOUNTER — Other Ambulatory Visit: Payer: Self-pay | Admitting: Family Medicine

## 2011-07-02 ENCOUNTER — Telehealth: Payer: Self-pay | Admitting: Family Medicine

## 2011-07-02 DIAGNOSIS — K219 Gastro-esophageal reflux disease without esophagitis: Secondary | ICD-10-CM

## 2011-07-02 DIAGNOSIS — E039 Hypothyroidism, unspecified: Secondary | ICD-10-CM

## 2011-07-02 DIAGNOSIS — E7800 Pure hypercholesterolemia, unspecified: Secondary | ICD-10-CM

## 2011-07-02 DIAGNOSIS — I10 Essential (primary) hypertension: Secondary | ICD-10-CM

## 2011-07-02 DIAGNOSIS — L237 Allergic contact dermatitis due to plants, except food: Secondary | ICD-10-CM

## 2011-07-02 DIAGNOSIS — M949 Disorder of cartilage, unspecified: Secondary | ICD-10-CM

## 2011-07-02 NOTE — Telephone Encounter (Signed)
Message copied by Judy Pimple on Thu Jul 02, 2011  7:12 PM ------      Message from: Baldomero Lamy      Created: Mon Jun 22, 2011  1:32 PM      Regarding: Cpx labs Fri 07/03/11       Please order  future cpx labs for pt's upcomming lab appt.      Thanks      Tasha      px

## 2011-07-03 ENCOUNTER — Other Ambulatory Visit: Payer: BC Managed Care – PPO

## 2011-07-10 ENCOUNTER — Encounter: Payer: BC Managed Care – PPO | Admitting: Family Medicine

## 2011-07-17 ENCOUNTER — Other Ambulatory Visit (INDEPENDENT_AMBULATORY_CARE_PROVIDER_SITE_OTHER): Payer: BC Managed Care – PPO

## 2011-07-17 DIAGNOSIS — M899 Disorder of bone, unspecified: Secondary | ICD-10-CM

## 2011-07-17 DIAGNOSIS — E7800 Pure hypercholesterolemia, unspecified: Secondary | ICD-10-CM

## 2011-07-17 DIAGNOSIS — K219 Gastro-esophageal reflux disease without esophagitis: Secondary | ICD-10-CM

## 2011-07-17 DIAGNOSIS — E039 Hypothyroidism, unspecified: Secondary | ICD-10-CM

## 2011-07-17 DIAGNOSIS — I10 Essential (primary) hypertension: Secondary | ICD-10-CM

## 2011-07-17 LAB — CBC WITH DIFFERENTIAL/PLATELET
Basophils Relative: 0.7 % (ref 0.0–3.0)
Eosinophils Relative: 2.4 % (ref 0.0–5.0)
HCT: 39.2 % (ref 36.0–46.0)
Lymphs Abs: 2.3 10*3/uL (ref 0.7–4.0)
MCV: 86.3 fl (ref 78.0–100.0)
Monocytes Absolute: 0.5 10*3/uL (ref 0.1–1.0)
Neutro Abs: 3.3 10*3/uL (ref 1.4–7.7)
RBC: 4.54 Mil/uL (ref 3.87–5.11)
WBC: 6.3 10*3/uL (ref 4.5–10.5)

## 2011-07-17 LAB — COMPREHENSIVE METABOLIC PANEL
Albumin: 4 g/dL (ref 3.5–5.2)
Alkaline Phosphatase: 49 U/L (ref 39–117)
BUN: 18 mg/dL (ref 6–23)
Creatinine, Ser: 1 mg/dL (ref 0.4–1.2)
Glucose, Bld: 115 mg/dL — ABNORMAL HIGH (ref 70–99)
Total Bilirubin: 0.5 mg/dL (ref 0.3–1.2)

## 2011-07-17 LAB — LIPID PANEL
Cholesterol: 209 mg/dL — ABNORMAL HIGH (ref 0–200)
Total CHOL/HDL Ratio: 4
Triglycerides: 138 mg/dL (ref 0.0–149.0)
VLDL: 27.6 mg/dL (ref 0.0–40.0)

## 2011-07-24 ENCOUNTER — Ambulatory Visit (INDEPENDENT_AMBULATORY_CARE_PROVIDER_SITE_OTHER): Payer: BC Managed Care – PPO | Admitting: Family Medicine

## 2011-07-24 ENCOUNTER — Encounter: Payer: Self-pay | Admitting: Family Medicine

## 2011-07-24 VITALS — BP 132/80 | HR 75 | Temp 98.2°F | Ht 65.75 in | Wt 188.5 lb

## 2011-07-24 DIAGNOSIS — E039 Hypothyroidism, unspecified: Secondary | ICD-10-CM

## 2011-07-24 DIAGNOSIS — M899 Disorder of bone, unspecified: Secondary | ICD-10-CM

## 2011-07-24 DIAGNOSIS — E7800 Pure hypercholesterolemia, unspecified: Secondary | ICD-10-CM

## 2011-07-24 DIAGNOSIS — Z124 Encounter for screening for malignant neoplasm of cervix: Secondary | ICD-10-CM

## 2011-07-24 DIAGNOSIS — R05 Cough: Secondary | ICD-10-CM

## 2011-07-24 DIAGNOSIS — I10 Essential (primary) hypertension: Secondary | ICD-10-CM

## 2011-07-24 DIAGNOSIS — Z23 Encounter for immunization: Secondary | ICD-10-CM

## 2011-07-24 DIAGNOSIS — Z01419 Encounter for gynecological examination (general) (routine) without abnormal findings: Secondary | ICD-10-CM

## 2011-07-24 DIAGNOSIS — K219 Gastro-esophageal reflux disease without esophagitis: Secondary | ICD-10-CM

## 2011-07-24 MED ORDER — OMEPRAZOLE 20 MG PO CPDR: 20.0000 mg | DELAYED_RELEASE_CAPSULE | Freq: Two times a day (BID) | ORAL | Status: DC

## 2011-07-24 NOTE — Assessment & Plan Note (Signed)
?   Causing cough  Will inc ppi to bid and update

## 2011-07-24 NOTE — Assessment & Plan Note (Signed)
Ongoing and I suspect reflux related  (pt gets headache with cough - ? Consider neurol eval in future if not imp) Will inc omeprazole to bid and if not imp in 2-4 wk will ref to ENT

## 2011-07-24 NOTE — Progress Notes (Signed)
Subjective:    Patient ID: Rebecca Kramer, female    DOB: 05-Mar-1946, 66 y.o.   MRN: [id number]  HPI Here for check up of chronic medical conditions and to review health mt list   Is feeling pretty good   Wants to talk about - ? Whether she needs a pap smear  Has one ovary left   Has some discharge from her rectal area  Went to GI doctor for it several years ago  Gave her a canasa suppository and it did not work well  She is worried about a fistula or tear  Jelly like discharge- after bms -- very annoying   Chronic cough - comes and goes sporatic  Gets headache with her cough  Has seen ENT - did not tell her anything  Saw Pulm -- Dr Shelle Iron  Has known gerd and hiatal hernia --had some esophagitis  Is on omeprazole -- one per day -- takes it in the am (used to take bid )  ? If cough was better bid   Gets headache when she coughs   Due to see GI -- no f/u with Dr Shawna Orleans Dr Shelle Iron - pulmonary -- in June -- and sees for sleep apnea   Hypothyroid Lab Results  Component Value Date   TSH 3.56 07/17/2011   clinically  Cholesterol Lab Results  Component Value Date   CHOL 209* 07/17/2011   CHOL 196 02/05/2010   CHOL 214* 11/06/2009   Lab Results  Component Value Date   HDL 51.00 07/17/2011   HDL 14.78 02/05/2010   HDL 50.90 11/06/2009   Lab Results  Component Value Date   LDLCALC 130* 02/05/2010   LDLCALC 112* 10/08/2008   LDLCALC 126* 08/05/2007   Lab Results  Component Value Date   TRIG 138.0 07/17/2011   TRIG 84.0 02/05/2010   TRIG 76.0 11/06/2009   Lab Results  Component Value Date   CHOLHDL 4 07/17/2011   CHOLHDL 4 02/05/2010   CHOLHDL 4 11/06/2009   Lab Results  Component Value Date   LDLDIRECT 129.6 07/17/2011   LDLDIRECT 149.8 11/06/2009    Is down a bit-- happy about that  Diet- back to normal after xmas- and eating much less fat and grease  Walking regularly   Glucose 115  Wt is up 10 lb with bmi of 30 Is struggling with weight  Did weight watchers on line  - and did not work out - too cumbersome   Osteopenia- ? Last dexa Vit D was nl at 51 dexa was 5/10-- will be due after may   Hysterectomy for endometriosis One ovary  No hx of abn paps  No new partners    Pneumovax  Has not had  Flu shot had it in November  Had zostavax   mammo 11/12- good  No lumps on self exam   colonosc 9/09   Next one due in 10 years  Mother had hx of polyps   Patient Active Problem List  Diagnoses  . HYPOTHYROIDISM  . PURE HYPERCHOLESTEROLEMIA  . OBSTRUCTIVE SLEEP APNEA  . HYPERTENSION  . REFLUX, ESOPHAGEAL  . ANAL OR RECTAL PAIN  . ECZEMA  . INFLAMED SEBORRHEIC KERATOSIS  . OSTEOPENIA  . COUGH  . Bronchitis   Past Medical History  Diagnosis Date  . Hypertension   . Hypothyroidism   . Diverticulosis   . GERD (gastroesophageal reflux disease)     esophagitis with ulceration  . Osteopenia   . Bone spur  Left foot / posterior  . Menopausal symptoms     hot flashes  . Arthritis   . Sleep apnea 2007    AHI 45/hr in 2007   Past Surgical History  Procedure Date  . Abdominal hysterectomy 1983    partial/endometriosis  . Breast surgery 1970's    breast lumpectomy/benign  . Breast surgery 1994    breast implants/saline  . Thumb nodule     removed  . Esophagogastroduodenoscopy 03/2008    esophagitis with ulcerations/?barretts  . Tonsillectomy   . Dorsal compartment release 05/14/2011    Procedure: RELEASE DORSAL COMPARTMENT (DEQUERVAIN);  Surgeon: Wyn Forster., MD;  Location: [facility];  Service: Orthopedics;  Laterality: Right;  right release dorsal compartment/ de quervain release with debridement of mucoid cyst   History  Substance Use Topics  . Smoking status: Never Smoker   . Smokeless tobacco: Never Used  . Alcohol Use: No   Family History  Problem Relation Age of Onset  . Hypertension Mother   . Hyperlipidemia Mother   . Colon polyps Mother   . Heart disease Mother   . Migraines Mother   .  Cancer Father     lung CA  . Migraines Brother   . Cancer Maternal Aunt     breast CA   No Known Allergies Current Outpatient Prescriptions on File Prior to Visit  Medication Sig Dispense Refill  . Calcium-Vitamin D-Vitamin K (VIACTIV PO) Take 1 tablet by mouth 3 (three) times daily.        . hydrochlorothiazide (HYDRODIURIL) 25 MG tablet TAKE 1 TABLET BY MOUTH EVERY DAY  90 tablet  0  . levothyroxine (SYNTHROID, LEVOTHROID) 88 MCG tablet Take 88 mcg by mouth daily.        . Multiple Vitamin (MULTIVITAMIN) tablet Take 1 tablet by mouth daily.            Review of Systems Review of Systems  Constitutional: Negative for fever, appetite change, fatigue and unexpected weight change.  Eyes: Negative for pain and visual disturbance.  Respiratory: pos for cough and neg for  and shortness of breath.   Cardiovascular: Negative for cp or palpitations    Gastrointestinal: Negative for nausea, diarrhea and constipation.  Genitourinary: Negative for urgency and frequency.  Skin: Negative for pallor or rash   Neurological: Negative for weakness, light-headedness, numbness and headaches.  Hematological: Negative for adenopathy. Does not bruise/bleed easily.  Psychiatric/Behavioral: Negative for dysphoric mood. The patient is not nervous/anxious.          Objective:   Physical Exam  Constitutional: She appears well-developed and well-nourished. No distress.  HENT:  Head: Normocephalic and atraumatic.  Right Ear: External ear normal.  Left Ear: External ear normal.  Nose: Nose normal.  Mouth/Throat: Oropharynx is clear and moist.  Eyes: Conjunctivae and EOM are normal. Pupils are equal, round, and reactive to light. No scleral icterus.  Neck: Normal range of motion. Neck supple. No JVD present. No thyromegaly present.  Cardiovascular: Normal rate, regular rhythm, normal heart sounds and intact distal pulses.  Exam reveals no gallop.   Pulmonary/Chest: Effort normal and breath sounds  normal. No respiratory distress. She has no wheezes. She exhibits no tenderness.  Abdominal: Soft. Bowel sounds are normal. She exhibits no distension and no mass. There is no tenderness.  Genitourinary: Rectum normal and vagina normal. Rectal exam shows no fissure, no mass and no tenderness. Guaiac negative stool. No breast swelling, tenderness, discharge or bleeding. There is no  rash or lesion on the right labia. There is no rash or lesion on the left labia. No tenderness or bleeding around the vagina. No vaginal discharge found.       Breast exam: No mass, nodules, thickening, tenderness, bulging, retraction, inflamation, nipple discharge or skin changes noted.  No axillary or clavicular LA.  Chaperoned exam.    o External genitalia nl appearing o Urethral meatus normal without mass o Urethra  Normal  o Bladder normal appearance  o Vagina nl mucosa and appearance o Cervix  Surgically absent o Uterus  Surgically absent  o Adnexa/parametria remaining ovary non palpable  No M or tenderness o Anus and perineum nl appearing -no discharge , tone is nl and heme neg stool  Musculoskeletal: Normal range of motion. She exhibits no edema and no tenderness.  Lymphadenopathy:    She has no cervical adenopathy.  Neurological: She has normal reflexes. No cranial nerve deficit. She exhibits normal muscle tone. Coordination normal.  Skin: Skin is warm and dry. No rash noted. No erythema. No pallor.  Psychiatric: She has a normal mood and affect.          Assessment & Plan:

## 2011-07-24 NOTE — Assessment & Plan Note (Signed)
bp in fair control at this time  No changes needed  Disc lifstyle change with low sodium diet and exercise   

## 2011-07-24 NOTE — Assessment & Plan Note (Signed)
slt improved and borderline  Also watching sugar Disc goals for lipids and reasons to control them Rev labs with pt Rev low sat fat diet in detail

## 2011-07-24 NOTE — Assessment & Plan Note (Signed)
Up to date dexa Next due in may Rev ca and D Good D level  Exercise encouraged

## 2011-07-24 NOTE — Assessment & Plan Note (Signed)
theraputic tsh with no clinical symptoms  Will not change dose

## 2011-07-24 NOTE — Patient Instructions (Addendum)
Pneumonia vaccine today  Increase your omeprazle to twice daily  If cough does not improve- call and let me know - I will refer you to ENT Stay active Avoid red meat/ fried foods/ egg yolks/ fatty breakfast meats/ butter, cheese and high fat dairy/ and shellfish   Sugar is borderline high so cut out sweets and keep starches 2-3 oz per serving (hi fiber best)

## 2011-07-26 DIAGNOSIS — Z01419 Encounter for gynecological examination (general) (routine) without abnormal findings: Secondary | ICD-10-CM | POA: Insufficient documentation

## 2011-07-26 NOTE — Assessment & Plan Note (Signed)
Nl exam today without pap - no vaginal or rectal discharge seen/ no M or tenderness on exam

## 2011-09-01 ENCOUNTER — Other Ambulatory Visit: Payer: Self-pay | Admitting: Family Medicine

## 2011-10-03 ENCOUNTER — Other Ambulatory Visit: Payer: Self-pay | Admitting: Family Medicine

## 2011-12-14 ENCOUNTER — Telehealth: Payer: Self-pay | Admitting: Family Medicine

## 2011-12-14 NOTE — Telephone Encounter (Signed)
The patient had instructions to call in May to schedule a bone density and is requesting a call back with instructions.  Thanks

## 2011-12-15 ENCOUNTER — Telehealth: Payer: Self-pay | Admitting: Family Medicine

## 2011-12-15 DIAGNOSIS — M899 Disorder of bone, unspecified: Secondary | ICD-10-CM

## 2011-12-15 NOTE — Telephone Encounter (Signed)
Order done

## 2011-12-15 NOTE — Telephone Encounter (Signed)
Patient called to get her Bone Density scheduled. Please put order in Epic for her Bone Density at [facility].

## 2011-12-24 ENCOUNTER — Ambulatory Visit
Admission: RE | Admit: 2011-12-24 | Discharge: 2011-12-24 | Disposition: A | Discharge status: Home / Self Care | Payer: BC Managed Care – PPO | Source: Ambulatory Visit | Attending: Family Medicine | Admitting: Family Medicine

## 2011-12-24 DIAGNOSIS — M949 Disorder of cartilage, unspecified: Secondary | ICD-10-CM

## 2011-12-28 ENCOUNTER — Encounter: Payer: Self-pay | Admitting: Pulmonary Disease

## 2011-12-28 ENCOUNTER — Ambulatory Visit (INDEPENDENT_AMBULATORY_CARE_PROVIDER_SITE_OTHER): Payer: BC Managed Care – PPO | Admitting: Pulmonary Disease

## 2011-12-28 VITALS — BP 120/82 | HR 89 | Temp 98.9°F | Ht 66.0 in | Wt 189.0 lb

## 2011-12-28 DIAGNOSIS — G4733 Obstructive sleep apnea (adult) (pediatric): Secondary | ICD-10-CM

## 2011-12-28 NOTE — Assessment & Plan Note (Signed)
The patient is wearing CPAP compliantly and does see some benefit, but she is not very happy overall with having to use the device.  I've discussed with her the possibility of trying a dental appliance, but she understands this may not completely treat her degree of sleep apnea.  She is going to think about it and will call if she wishes to be referred.  Have also encouraged her to work on progressive weight loss.

## 2011-12-28 NOTE — Progress Notes (Signed)
  Subjective:    Patient ID: Rebecca Kramer, female    DOB: 1946/06/16, 66 y.o.   MRN: [id number]  HPI Patient in today for followup of her known obstructive sleep apnea.  She is wearing CPAP compliantly, but is not very happy overall with the device.  It is inconvenient for her, and she can have claustrophobia issues at times.  It does help her sleep, but she has not seen a big change in her daytime alertness.   Review of Systems  Constitutional: Negative for fever and unexpected weight change.  HENT: Positive for congestion and sneezing. Negative for ear pain, nosebleeds, sore throat, rhinorrhea, trouble swallowing, dental problem, postnasal drip and sinus pressure.   Eyes: Negative for redness and itching.  Respiratory: Negative for cough, chest tightness, shortness of breath and wheezing.   Cardiovascular: Negative for palpitations and leg swelling.  Gastrointestinal: Negative for nausea and vomiting.  Genitourinary: Negative for dysuria.  Musculoskeletal: Negative for joint swelling.  Skin: Negative for rash.  Neurological: Negative for headaches.  Hematological: Does not bruise/bleed easily.  Psychiatric/Behavioral: Negative for dysphoric mood. The patient is not nervous/anxious.   All other systems reviewed and are negative.       Objective:   Physical Exam Overweight female in no acute distress No skin breakdown or pressure necrosis from the CPAP mask Lower extremities without edema, no cyanosis Alert and oriented, moves all 4 extremities, does not appear to be sleepy.       Assessment & Plan:

## 2011-12-28 NOTE — Patient Instructions (Addendum)
Stay on cpap, but can consider a trying a dental appliance. Work on weight reduction Can try chlorpheniramine 4mg   2 at bedtime for postnasal drip followup with me in one year if still wearing cpap.

## 2012-02-29 ENCOUNTER — Telehealth: Payer: Self-pay | Admitting: Family Medicine

## 2012-02-29 NOTE — Telephone Encounter (Signed)
Caller: Adaiah/Patient; Patient Name: Rebecca Kramer; PCP: [name] [facility]); Best Callback Phone Number: [phone].  Call regarding Urinary Tract Symptoms. Afebrile.  All emergent symptoms ruled out per Urinary Symptoms-Female with exception to 'Has one or more urinary tract symptoms and has not been previously evaluated".  See Provider within 24 hrs.  Instructed pt to go to UC for evaluation per standing orders.  Pti is out of town.

## 2012-03-21 ENCOUNTER — Telehealth: Payer: Self-pay | Admitting: Family Medicine

## 2012-03-21 NOTE — Telephone Encounter (Signed)
The typo was corrected -- instructions  are the same -- she has osteopenia without any significant change from last scan- that is reassuring -please inform her  Continue ca and vit D thanks

## 2012-03-21 NOTE — Telephone Encounter (Signed)
Pt wanted to know if the error on her bone den. Scan was resolved, and what did the scan show?

## 2012-03-21 NOTE — Telephone Encounter (Signed)
Patient calling has question about bone density report, pt's phone was cut off in the message

## 2012-03-21 NOTE — Telephone Encounter (Signed)
Notified pt the typo was corrected and instructions are the same and continue with Ca and vit D

## 2012-04-11 ENCOUNTER — Other Ambulatory Visit: Payer: Self-pay | Admitting: Family Medicine

## 2012-04-11 DIAGNOSIS — Z1231 Encounter for screening mammogram for malignant neoplasm of breast: Secondary | ICD-10-CM

## 2012-04-26 ENCOUNTER — Encounter: Payer: Self-pay | Admitting: Family Medicine

## 2012-04-26 ENCOUNTER — Ambulatory Visit (INDEPENDENT_AMBULATORY_CARE_PROVIDER_SITE_OTHER): Payer: BC Managed Care – PPO | Admitting: Family Medicine

## 2012-04-26 VITALS — BP 152/70 | HR 81 | Temp 98.8°F | Wt 190.0 lb

## 2012-04-26 DIAGNOSIS — R300 Dysuria: Secondary | ICD-10-CM

## 2012-04-26 DIAGNOSIS — N390 Urinary tract infection, site not specified: Secondary | ICD-10-CM

## 2012-04-26 LAB — POCT URINALYSIS DIPSTICK
Bilirubin, UA: NEGATIVE
Glucose, UA: NEGATIVE
Nitrite, UA: NEGATIVE

## 2012-04-26 MED ORDER — SULFAMETHOXAZOLE-TRIMETHOPRIM 800-160 MG PO TABS: 1.0000 | ORAL_TABLET | Freq: Two times a day (BID) | ORAL | Status: DC

## 2012-04-26 NOTE — Patient Instructions (Signed)
Drink plenty of water and start the antibiotics if you don't improve.  We'll contact you with your lab report.  Take care.

## 2012-04-26 NOTE — Progress Notes (Signed)
Felt "something" last week, then better until this AM.    Dysuria: some frequency, pressure, but no burning ("yet") duration of symptoms: worse today abdominal pain: yes Fevers: no back pain: no Vomiting: no other concerns: treated with macrobid in late 8/13.    Meds, vitals, and allergies reviewed.   ROS: See HPI.  Otherwise negative.    GEN: nad, alert and oriented HEENT: mucous membranes moist NECK: supple CV: rrr.  PULM: ctab, no inc wob ABD: soft, +bs, suprapubic area not tender EXT: no edema BACK: no CVA pain

## 2012-04-26 NOTE — Assessment & Plan Note (Signed)
Check ucx.  She'd like to inc fluids and see if this doesn't resolve as her sx aren't yet disabling/severe.  This is reasonable.  Hold septra for now, start if inc in sx.  She agrees with plan.  Nontoxic.

## 2012-04-28 LAB — URINE CULTURE: Organism ID, Bacteria: NO GROWTH

## 2012-05-20 ENCOUNTER — Ambulatory Visit
Admission: RE | Admit: 2012-05-20 | Discharge: 2012-05-20 | Disposition: A | Discharge status: Home / Self Care | Payer: BC Managed Care – PPO | Source: Ambulatory Visit | Attending: Family Medicine | Admitting: Family Medicine

## 2012-05-20 DIAGNOSIS — Z1231 Encounter for screening mammogram for malignant neoplasm of breast: Secondary | ICD-10-CM

## 2012-05-23 ENCOUNTER — Encounter: Payer: Self-pay | Admitting: Family Medicine

## 2012-05-23 ENCOUNTER — Encounter: Payer: Self-pay | Admitting: *Deleted

## 2012-05-23 LAB — HM MAMMOGRAPHY: HM Mammogram: NORMAL

## 2012-07-27 ENCOUNTER — Other Ambulatory Visit: Payer: Self-pay | Admitting: Family Medicine

## 2012-07-28 NOTE — Telephone Encounter (Signed)
No recent f/u or CPE appts and no future appts, last OV was in Oct for UTI, ok to refill?

## 2012-07-28 NOTE — Telephone Encounter (Signed)
Please schedule annual exam this summer and refil until then, thanks

## 2012-07-29 NOTE — Telephone Encounter (Signed)
Left voicemail requesting pt to call our office 

## 2012-07-31 NOTE — Telephone Encounter (Signed)
Pending call back- schedule visit and refil until then

## 2012-08-01 NOTE — Telephone Encounter (Signed)
Left voicemail requesting pt to call office 

## 2012-08-04 NOTE — Telephone Encounter (Signed)
Pt needed a CPE sooner because she is going on vacation out of town in the summer, CPE with labs prior scheduled for April and meds refilled until then

## 2012-09-13 ENCOUNTER — Telehealth: Payer: Self-pay | Admitting: Family Medicine

## 2012-09-13 NOTE — Telephone Encounter (Signed)
Patient Information:  Caller Name: Kyrianna  Phone: [phone]  Patient: Rebecca Kramer  Gender: Female  DOB: 1945/08/03  Age: 67 Years  PCP: [name] [facility])  Office Follow Up:  Does the office need to follow up with this patient?: No  Instructions For The Office: N/A  RN Note:  Patient states that she did have a fever in the beginning but that has resolved.  Was doing saline washes however stopped after a few days.  Instructed to start back on the saline washes.  Symptoms  Reason For Call & Symptoms: Congestion, Cough  Reviewed Health History In EMR: Yes  Reviewed Medications In EMR: Yes  Reviewed Allergies In EMR: Yes  Reviewed Surgeries / Procedures: Yes  Date of Onset of Symptoms: 09/06/2012  Treatments Tried: Mucinex, Nasacort  Treatments Tried Worked: No  Guideline(s) Used:  Colds  Disposition Per Guideline:   Home Care  Reason For Disposition Reached:   Colds with no complications  Advice Given:  For a Stuffy Nose - Use Nasal Washes:  Step-By-Step Instructions:   Step 1: Lean over a sink.  Step 2: Gently squirt or spray warm salt water into one of your nostrils.  Step 3: Some of the water may run into the back of your throat. Spit this out. If you swallow the salt water it will not hurt you.  Step 4: Blow your nose to clean out the water and mucus.  Step 5: Repeat steps 1-4 for the other nostril. You can do this a couple times a day if it seems to help you.  Humidifier:  If the air in your home is dry, use a cool-mist humidifier  Treatment for Associated Symptoms of Colds:  Hydrate: Drink adequate liquids.  Expected Course:   Fever may last 2-3 days  Nasal discharge 7-14 days  Cough up to 2-3 weeks.  Call Back If:  Difficulty breathing occurs  Nasal discharge lasts more than 10 days  Cough lasts more than 3 weeks  You become worse

## 2012-09-25 ENCOUNTER — Other Ambulatory Visit: Payer: Self-pay | Admitting: Family Medicine

## 2012-10-04 ENCOUNTER — Other Ambulatory Visit: Payer: Self-pay | Admitting: Family Medicine

## 2012-10-06 ENCOUNTER — Telehealth: Payer: Self-pay | Admitting: Family Medicine

## 2012-10-06 DIAGNOSIS — E039 Hypothyroidism, unspecified: Secondary | ICD-10-CM

## 2012-10-06 DIAGNOSIS — Z0000 Encounter for general adult medical examination without abnormal findings: Secondary | ICD-10-CM

## 2012-10-06 DIAGNOSIS — I10 Essential (primary) hypertension: Secondary | ICD-10-CM

## 2012-10-06 DIAGNOSIS — E7800 Pure hypercholesterolemia, unspecified: Secondary | ICD-10-CM

## 2012-10-06 DIAGNOSIS — K219 Gastro-esophageal reflux disease without esophagitis: Secondary | ICD-10-CM

## 2012-10-06 DIAGNOSIS — M899 Disorder of bone, unspecified: Secondary | ICD-10-CM

## 2012-10-06 NOTE — Telephone Encounter (Signed)
Message copied by Judy Pimple on Thu Oct 06, 2012  7:37 PM ------      Message from: Baldomero Lamy      Created: Wed Oct 05, 2012 11:30 AM      Regarding: Cpx labs Fri 4/4        Please order  future cpx labs for pt's upcoming lab appt.      Thanks      Tasha       ------

## 2012-10-07 ENCOUNTER — Other Ambulatory Visit (INDEPENDENT_AMBULATORY_CARE_PROVIDER_SITE_OTHER): Payer: BC Managed Care – PPO

## 2012-10-07 DIAGNOSIS — E039 Hypothyroidism, unspecified: Secondary | ICD-10-CM

## 2012-10-07 DIAGNOSIS — M899 Disorder of bone, unspecified: Secondary | ICD-10-CM

## 2012-10-07 DIAGNOSIS — E7800 Pure hypercholesterolemia, unspecified: Secondary | ICD-10-CM

## 2012-10-07 DIAGNOSIS — M949 Disorder of cartilage, unspecified: Secondary | ICD-10-CM

## 2012-10-07 DIAGNOSIS — K219 Gastro-esophageal reflux disease without esophagitis: Secondary | ICD-10-CM

## 2012-10-07 DIAGNOSIS — I10 Essential (primary) hypertension: Secondary | ICD-10-CM

## 2012-10-07 LAB — TSH: TSH: 0.12 u[IU]/mL — ABNORMAL LOW (ref 0.35–5.50)

## 2012-10-07 LAB — CBC WITH DIFFERENTIAL/PLATELET
Basophils Absolute: 0 10*3/uL (ref 0.0–0.1)
Hemoglobin: 12.6 g/dL (ref 12.0–15.0)
Lymphocytes Relative: 45.8 % (ref 12.0–46.0)
Monocytes Relative: 9.7 % (ref 3.0–12.0)
Neutrophils Relative %: 40.5 % — ABNORMAL LOW (ref 43.0–77.0)
Platelets: 276 10*3/uL (ref 150.0–400.0)
RDW: 14.4 % (ref 11.5–14.6)

## 2012-10-07 LAB — COMPREHENSIVE METABOLIC PANEL
ALT: 19 U/L (ref 0–35)
CO2: 26 mEq/L (ref 19–32)
Calcium: 9.1 mg/dL (ref 8.4–10.5)
Chloride: 102 mEq/L (ref 96–112)
GFR: 83.23 mL/min (ref >60.00)
Potassium: 4.3 mEq/L (ref 3.5–5.1)
Sodium: 136 mEq/L (ref 135–145)
Total Protein: 6.9 g/dL (ref 6.0–8.3)

## 2012-10-07 LAB — LIPID PANEL: Total CHOL/HDL Ratio: 4

## 2012-10-08 LAB — VITAMIN D 25 HYDROXY (VIT D DEFICIENCY, FRACTURES): Vit D, 25-Hydroxy: 49 ng/mL (ref 30–89)

## 2012-10-11 ENCOUNTER — Encounter: Payer: Self-pay | Admitting: Family Medicine

## 2012-10-11 ENCOUNTER — Other Ambulatory Visit (HOSPITAL_COMMUNITY)
Admission: RE | Admit: 2012-10-11 | Discharge: 2012-10-11 | Disposition: A | Discharge status: Home / Self Care | Payer: BC Managed Care – PPO | Source: Ambulatory Visit | Attending: Family Medicine | Admitting: Family Medicine

## 2012-10-11 ENCOUNTER — Ambulatory Visit (INDEPENDENT_AMBULATORY_CARE_PROVIDER_SITE_OTHER): Payer: BC Managed Care – PPO | Admitting: Family Medicine

## 2012-10-11 VITALS — BP 130/80 | HR 93 | Temp 98.3°F | Ht 65.75 in | Wt 172.2 lb

## 2012-10-11 DIAGNOSIS — Z01419 Encounter for gynecological examination (general) (routine) without abnormal findings: Secondary | ICD-10-CM | POA: Insufficient documentation

## 2012-10-11 DIAGNOSIS — Z1151 Encounter for screening for human papillomavirus (HPV): Secondary | ICD-10-CM | POA: Insufficient documentation

## 2012-10-11 DIAGNOSIS — M899 Disorder of bone, unspecified: Secondary | ICD-10-CM

## 2012-10-11 DIAGNOSIS — D1739 Benign lipomatous neoplasm of skin and subcutaneous tissue of other sites: Secondary | ICD-10-CM

## 2012-10-11 DIAGNOSIS — I10 Essential (primary) hypertension: Secondary | ICD-10-CM

## 2012-10-11 DIAGNOSIS — E7800 Pure hypercholesterolemia, unspecified: Secondary | ICD-10-CM

## 2012-10-11 DIAGNOSIS — M949 Disorder of cartilage, unspecified: Secondary | ICD-10-CM

## 2012-10-11 DIAGNOSIS — E039 Hypothyroidism, unspecified: Secondary | ICD-10-CM

## 2012-10-11 DIAGNOSIS — Z0000 Encounter for general adult medical examination without abnormal findings: Secondary | ICD-10-CM

## 2012-10-11 DIAGNOSIS — D1720 Benign lipomatous neoplasm of skin and subcutaneous tissue of unspecified limb: Secondary | ICD-10-CM | POA: Insufficient documentation

## 2012-10-11 MED ORDER — HYDROCHLOROTHIAZIDE 25 MG PO TABS: 25.0000 mg | ORAL_TABLET | Freq: Every day | ORAL | Status: DC

## 2012-10-11 NOTE — Progress Notes (Signed)
Subjective:    Patient ID: Rebecca Kramer, female    DOB: 10-24-1945, 67 y.o.   MRN: [id number]  HPI Here for health maintenance exam and to review chronic medical problems    She is covered under her husband's insurance - so not a medicare physical   Wt is down 18 lb with bmi of 28 Has lost the same by her scales  She finally started seeing someone to help her - she is taking a bunch of minerals and a supplement to her levothyroxine- seeing Dr Alessandra Bevels Will send labs and defer to her for thyroid treatment  Hypothyroid Lab Results  Component Value Date   TSH 0.12* 10/07/2012   She is walking for exercise - and plans to [facility] [employer] to do some weight lifting  Is eating more veg and fruit and lean meats   (like the paleo)  Did a cleanse with her to start with -that worked well   Flu vaccine- got that in the fall  zost 5/11 ptx 1/13 Td 12/07   mammo 11/13 Self exam- no lumps or changes   Gyn- no symptoms or problems -and no longer sees a gyn physician  Not sure when her last pap was ?  No hx of abn paps  No symptoms , no new partners   Osteopenia dexa 6/13 No significant change D level is 49, takes her ca and D No fractures   Falls--none at all   Mood- is good   bp is stable today  No cp or palpitations or headaches or edema  No side effects to medicines  BP Readings from Last 3 Encounters:  10/11/12 130/80  04/26/12 152/70  12/28/11 120/82     Hyperlipidemia Lab Results  Component Value Date   CHOL 152 10/07/2012   CHOL 209* 07/17/2011   CHOL 196 02/05/2010   Lab Results  Component Value Date   HDL 37.30* 10/07/2012   HDL 40.98 07/17/2011   HDL 11.91 02/05/2010   Lab Results  Component Value Date   LDLCALC 98 10/07/2012   LDLCALC 130* 02/05/2010   LDLCALC 112* 10/08/2008   Lab Results  Component Value Date   TRIG 86.0 10/07/2012   TRIG 138.0 07/17/2011   TRIG 84.0 02/05/2010   Lab Results  Component Value Date   CHOLHDL 4 10/07/2012   CHOLHDL 4 07/17/2011   CHOLHDL 4 02/05/2010   Lab Results  Component Value Date   LDLDIRECT 129.6 07/17/2011   LDLDIRECT 149.8 11/06/2009   ratio is good overall   Patient Active Problem List  Diagnosis  . HYPOTHYROIDISM  . PURE HYPERCHOLESTEROLEMIA  . OBSTRUCTIVE SLEEP APNEA  . HYPERTENSION  . REFLUX, ESOPHAGEAL  . ANAL OR RECTAL PAIN  . ECZEMA  . INFLAMED SEBORRHEIC KERATOSIS  . OSTEOPENIA  . Gynecological examination  . Routine general medical examination at a health care facility  . Encounter for routine gynecological examination  . Lipoma of axilla   Past Medical History  Diagnosis Date  . Hypertension   . Hypothyroidism   . Diverticulosis   . GERD (gastroesophageal reflux disease)     esophagitis with ulceration  . Osteopenia   . Bone spur     Left foot / posterior  . Menopausal symptoms     hot flashes  . Arthritis   . Sleep apnea 2007    AHI 45/hr in 2007   Past Surgical History  Procedure Laterality Date  . Abdominal hysterectomy  1983    partial/endometriosis  .  Breast surgery  1970's    breast lumpectomy/benign  . Breast surgery  1994    breast implants/saline  . Thumb nodule      removed  . Esophagogastroduodenoscopy  03/2008    esophagitis with ulcerations/?barretts  . Tonsillectomy    . Dorsal compartment release  05/14/2011    Procedure: RELEASE DORSAL COMPARTMENT (DEQUERVAIN);  Surgeon: Wyn Forster., MD;  Location: [facility];  Service: Orthopedics;  Laterality: Right;  right release dorsal compartment/ de quervain release with debridement of mucoid cyst   History  Substance Use Topics  . Smoking status: Never Smoker   . Smokeless tobacco: Never Used  . Alcohol Use: No   Family History  Problem Relation Age of Onset  . Hypertension Mother   . Hyperlipidemia Mother   . Colon polyps Mother   . Heart disease Mother   . Migraines Mother   . Cancer Father     lung CA  . Migraines Brother   . Cancer Maternal Aunt     breast CA   No Known  Allergies Current Outpatient Prescriptions on File Prior to Visit  Medication Sig Dispense Refill  . levothyroxine (SYNTHROID, LEVOTHROID) 88 MCG tablet TAKE 1 TABLET BY MOUTH ONCE A DAY  90 tablet  0  . Multiple Vitamin (MULTIVITAMIN) tablet Take 1 tablet by mouth daily.        Marland Kitchen omeprazole (PRILOSEC) 20 MG capsule TAKE 1 CAPSULE BY MOUTH 2 TIMES DAILY.  60 capsule  2  . Calcium-Vitamin D-Vitamin K (VIACTIV PO) Take 1 tablet by mouth 3 (three) times daily.         No current facility-administered medications on file prior to visit.    Review of Systems Review of Systems  Constitutional: Negative for fever, appetite change, fatigue and unexpected weight change.  Eyes: Negative for pain and visual disturbance.  Respiratory: Negative for cough and shortness of breath.   Cardiovascular: Negative for cp or palpitations    Gastrointestinal: Negative for nausea, diarrhea and constipation.  Genitourinary: Negative for urgency and frequency.  Skin: Negative for pallor or rash   Neurological: Negative for weakness, light-headedness, numbness and headaches.  Hematological: Negative for adenopathy. Does not bruise/bleed easily.  Psychiatric/Behavioral: Negative for dysphoric mood. The patient is not nervous/anxious.         Objective:   Physical Exam  Constitutional: She appears well-developed and well-nourished. No distress.  HENT:  Head: Normocephalic and atraumatic.  Right Ear: External ear normal.  Left Ear: External ear normal.  Nose: Nose normal.  Mouth/Throat: Oropharynx is clear and moist.  Eyes: Conjunctivae and EOM are normal. Pupils are equal, round, and reactive to light. Right eye exhibits no discharge. Left eye exhibits no discharge. No scleral icterus.  Neck: Normal range of motion. Neck supple. No JVD present. Carotid bruit is not present. No thyromegaly present.  Cardiovascular: Normal rate, regular rhythm, normal heart sounds and intact distal pulses.  Exam reveals no  gallop.   Pulmonary/Chest: Effort normal and breath sounds normal. No respiratory distress. She has no wheezes.  Abdominal: Soft. Bowel sounds are normal. She exhibits no distension, no abdominal bruit and no mass. There is no tenderness.  Genitourinary: Rectum normal and vagina normal. No breast swelling, tenderness, discharge or bleeding. There is no rash, tenderness or lesion on the right labia. There is no rash, tenderness or lesion on the left labia. Right adnexum displays no mass, no tenderness and no fullness. Left adnexum displays no mass, no tenderness  and no fullness. No bleeding around the vagina. No vaginal discharge found.  Breast exam: No mass, nodules, thickening, tenderness, bulging, retraction, inflamation, nipple discharge or skin changes noted.  No axillary or clavicular LA.  Chaperoned exam.    Uterus and cervix surg absent  Musculoskeletal: Normal range of motion. She exhibits no edema and no tenderness.  Lymphadenopathy:    She has no cervical adenopathy.  Neurological: She is alert. She has normal reflexes. No cranial nerve deficit. She exhibits normal muscle tone. Coordination normal.  Skin: Skin is warm and dry. No rash noted. No erythema. No pallor.  Fair skinned with lentigos  Lipoma - moderate size 3-5 cm posterior R axilla noted  Also several small lipomas noted over L quadricep area  Psychiatric: She has a normal mood and affect.          Assessment & Plan:

## 2012-10-11 NOTE — Assessment & Plan Note (Signed)
Reviewed health habits including diet and exercise and skin cancer prevention Also reviewed health mt list, fam hx and immunizations   Wellness labs reviewed  

## 2012-10-11 NOTE — Assessment & Plan Note (Signed)
tsh is low- pt states she is on cytomel from Dr Alessandra Bevels - I will defer further thyroid tx to her  Disc risks to bone density Pt feels good and has lost weight

## 2012-10-11 NOTE — Assessment & Plan Note (Signed)
bp in fair control at this time  No changes needed  Disc lifstyle change with low sodium diet and exercise   Reviewed labs today 

## 2012-10-11 NOTE — Assessment & Plan Note (Signed)
Per pt this is getting bigger and difficult to fit clothing over - she was referred to gen surg for eval

## 2012-10-11 NOTE — Assessment & Plan Note (Signed)
Stable dexa last summer- with good D level and exercise No fractures  Enc wt bearing exercise

## 2012-10-11 NOTE — Assessment & Plan Note (Signed)
Pap today with exam -routine

## 2012-10-11 NOTE — Patient Instructions (Addendum)
Your tsh is low  (this can mean too much thyroid hormone) - so Dr Alessandra Bevels may need to adjust your medicines- I will defer further thyroid treatment to her  Pap today  Keep up the good work with diet and exercise  We will do surgical referral at check out

## 2012-10-11 NOTE — Assessment & Plan Note (Signed)
Disc goals for lipids and reasons to control them Rev labs with pt Rev low sat fat diet in detail Will work on exercise to inc HDL

## 2012-10-18 ENCOUNTER — Encounter: Payer: Self-pay | Admitting: Family Medicine

## 2012-10-18 ENCOUNTER — Other Ambulatory Visit: Payer: Self-pay | Admitting: Family Medicine

## 2012-10-26 ENCOUNTER — Encounter: Payer: Self-pay | Admitting: General Surgery

## 2012-10-26 ENCOUNTER — Ambulatory Visit (INDEPENDENT_AMBULATORY_CARE_PROVIDER_SITE_OTHER): Payer: BC Managed Care – PPO | Admitting: General Surgery

## 2012-10-26 VITALS — BP 130/72 | HR 76 | Resp 12 | Ht 66.0 in | Wt 172.0 lb

## 2012-10-26 DIAGNOSIS — D1720 Benign lipomatous neoplasm of skin and subcutaneous tissue of unspecified limb: Secondary | ICD-10-CM

## 2012-10-26 DIAGNOSIS — D1739 Benign lipomatous neoplasm of skin and subcutaneous tissue of other sites: Secondary | ICD-10-CM

## 2012-10-26 NOTE — Patient Instructions (Addendum)
Patient advised to have lipoma to be removed.

## 2012-10-26 NOTE — Progress Notes (Signed)
Patient ID: Rebecca Kramer, female   DOB: May 27, 1946, 67 y.o.   MRN: [id number]  Chief Complaint  Patient presents with  . Other    lipoma    HPI Rebecca Kramer is a 67 y.o. female who presents for a lipoma under the right arm. The patient states the lipoma has been there for 10 years. She says it has gotten larger. She states it's mobile and is oblong in shape. No pain is associated with this area.    HPI  Past Medical History  Diagnosis Date  . Hypertension   . Hypothyroidism   . Diverticulosis   . GERD (gastroesophageal reflux disease)     esophagitis with ulceration  . Osteopenia   . Bone spur     Left foot / posterior  . Menopausal symptoms     hot flashes  . Arthritis   . Sleep apnea 2007    AHI 45/hr in 2007  . Hyperthyroidism     Past Surgical History  Procedure Laterality Date  . Abdominal hysterectomy  1983    partial/endometriosis  . Breast surgery  1970's    breast lumpectomy/benign  . Breast surgery  1994    breast implants/saline  . Thumb nodule      removed  . Esophagogastroduodenoscopy  03/2008    esophagitis with ulcerations/?barretts  . Tonsillectomy    . Dorsal compartment release  05/14/2011    Procedure: RELEASE DORSAL COMPARTMENT (DEQUERVAIN);  Surgeon: Wyn Forster., MD;  Location: [facility];  Service: Orthopedics;  Laterality: Right;  right release dorsal compartment/ de quervain release with debridement of mucoid cyst    Family History  Problem Relation Age of Onset  . Hypertension Mother   . Hyperlipidemia Mother   . Colon polyps Mother   . Heart disease Mother   . Migraines Mother   . Cancer Father     lung CA  . Migraines Brother   . Cancer Maternal Aunt     breast CA    Social History History  Substance Use Topics  . Smoking status: Never Smoker   . Smokeless tobacco: Never Used  . Alcohol Use: No    No Known Allergies  Current Outpatient Prescriptions  Medication Sig Dispense Refill  .  Acetylcysteine (NAC) 600 MG CAPS Take 600 mg by mouth at bedtime.      Marland Kitchen BIOTIN PO Take 8 mg by mouth daily.      . Calcium-Vitamin D-Vitamin K (VIACTIV PO) Take 1 tablet by mouth 3 (three) times daily.        . Cholecalciferol (VITAMIN D3) 1000 UNIT/SPRAY LIQD Take 1,000 Units by mouth 3 (three) times daily.      Marland Kitchen CINNAMON PO Take by mouth daily. 1/2 tsp      . GLYCINE PO Take by mouth at bedtime.      . hydrochlorothiazide (HYDRODIURIL) 25 MG tablet Take 1 tablet (25 mg total) by mouth daily.  90 tablet  3  . levothyroxine (SYNTHROID, LEVOTHROID) 88 MCG tablet TAKE 1 TABLET BY MOUTH ONCE A DAY  90 tablet  0  . liothyronine (CYTOMEL) 5 MCG tablet Take 5 mcg by mouth 3 (three) times daily.      . Multiple Vitamin (MULTIVITAMIN) tablet Take 1 tablet by mouth daily.        . Nutritional Supplements (DHEA PO) Take 5 mg by mouth daily.      . Nutritional Supplements (METABOLIC LIVER FORMULA PO) Take by mouth 2 (two)  times daily. (Synergy)      . omeprazole (PRILOSEC) 20 MG capsule TAKE 1 CAPSULE BY MOUTH 2 TIMES DAILY.  60 capsule  2  . OVER THE COUNTER MEDICATION Take by mouth daily. Omega marine oil-One tbsp      . OVER THE COUNTER MEDICATION Take 12.5 mg by mouth daily. iodine      . OVER THE COUNTER MEDICATION Take 200 mg by mouth daily. Salenomethionine      . Potassium Chromate POWD Take 600 mcg by mouth 2 (two) times daily.      . Probiotic Product (SOLUBLE FIBER/PROBIOTICS PO) Take by mouth daily.      . trace elements Cr-[id number]-Mo-[id number]-Zn, ADDAMEL-N, SOLN Take by mouth. 6-8 drops in fluids      . vitamin A 19147 UNIT capsule Take 25,000 Units by mouth daily.       No current facility-administered medications for this visit.    Review of Systems Review of Systems  Blood pressure 130/72, pulse 76, resp. rate 12, height 5\' 6"  (1.676 m), weight 172 lb (78.019 kg).  Physical Exam Physical Exam  Constitutional: She appears well-developed and well-nourished.  Neck: Trachea normal.  No mass and no thyromegaly present.  Cardiovascular: Normal rate, regular rhythm, normal heart sounds and normal pulses.   No murmur heard. Pulmonary/Chest: Effort normal and breath sounds normal.  Lymphadenopathy:    She has no cervical adenopathy.    She has no axillary adenopathy.  Skin:  8 cm lipoma along the posterior axillary line.     Data Reviewed October 07, 2012 laboratory studies.  Assessment    Large lipoma of the right posterior axilla, no evidence of involvement of her breast implants.     Plan    Excisional biopsy based on increasing size and new onset of local discomfort.        Earline Mayotte 10/26/2012, 9:56 PM

## 2012-11-29 ENCOUNTER — Ambulatory Visit: Payer: BC Managed Care – PPO | Admitting: General Surgery

## 2012-12-13 ENCOUNTER — Other Ambulatory Visit: Payer: Self-pay | Admitting: Family Medicine

## 2013-01-16 ENCOUNTER — Ambulatory Visit (INDEPENDENT_AMBULATORY_CARE_PROVIDER_SITE_OTHER): Payer: BC Managed Care – PPO | Admitting: General Surgery

## 2013-01-16 ENCOUNTER — Encounter: Payer: Self-pay | Admitting: General Surgery

## 2013-01-16 VITALS — BP 140/78 | Ht 66.0 in | Wt 176.0 lb

## 2013-01-16 DIAGNOSIS — D1720 Benign lipomatous neoplasm of skin and subcutaneous tissue of unspecified limb: Secondary | ICD-10-CM

## 2013-01-16 DIAGNOSIS — D1739 Benign lipomatous neoplasm of skin and subcutaneous tissue of other sites: Secondary | ICD-10-CM

## 2013-01-16 NOTE — Progress Notes (Signed)
Patient ID: Rebecca Kramer, female   DOB: 06/13/1946, 67 y.o.   MRN: [id number]  Chief Complaint  Patient presents with  . Procedure    excison lipoma right axilla    HPI Rebecca Kramer is a 67 y.o. female here today for anexcision right axilla. HPI  Past Medical History  Diagnosis Date  . Hypertension   . Hypothyroidism   . Diverticulosis   . GERD (gastroesophageal reflux disease)     esophagitis with ulceration  . Osteopenia   . Bone spur     Left foot / posterior  . Menopausal symptoms     hot flashes  . Arthritis   . Sleep apnea 2007    AHI 45/hr in 2007  . Hyperthyroidism     Past Surgical History  Procedure Laterality Date  . Abdominal hysterectomy  1983    partial/endometriosis  . Breast surgery  1970's    breast lumpectomy/benign  . Breast surgery  1994    breast implants/saline  . Thumb nodule      removed  . Esophagogastroduodenoscopy  03/2008    esophagitis with ulcerations/?barretts  . Tonsillectomy    . Dorsal compartment release  05/14/2011    Procedure: RELEASE DORSAL COMPARTMENT (DEQUERVAIN);  Surgeon: Wyn Forster., MD;  Location: [facility];  Service: Orthopedics;  Laterality: Right;  right release dorsal compartment/ de quervain release with debridement of mucoid cyst    Family History  Problem Relation Age of Onset  . Hypertension Mother   . Hyperlipidemia Mother   . Colon polyps Mother   . Heart disease Mother   . Migraines Mother   . Cancer Father     lung CA  . Migraines Brother   . Cancer Maternal Aunt     breast CA    Social History History  Substance Use Topics  . Smoking status: Never Smoker   . Smokeless tobacco: Never Used  . Alcohol Use: No    No Known Allergies  Current Outpatient Prescriptions  Medication Sig Dispense Refill  . Acetylcysteine (NAC) 600 MG CAPS Take 600 mg by mouth at bedtime.      Marland Kitchen BIOTIN PO Take 8 mg by mouth daily.      . Calcium-Vitamin D-Vitamin K (VIACTIV PO)  Take 1 tablet by mouth 3 (three) times daily.        . Cholecalciferol (VITAMIN D3) 1000 UNIT/SPRAY LIQD Take 1,000 Units by mouth 3 (three) times daily.      Marland Kitchen CINNAMON PO Take by mouth daily. 1/2 tsp      . GLYCINE PO Take by mouth at bedtime.      . hydrochlorothiazide (HYDRODIURIL) 25 MG tablet Take 1 tablet (25 mg total) by mouth daily.  90 tablet  3  . levothyroxine (SYNTHROID, LEVOTHROID) 88 MCG tablet TAKE 1 TABLET BY MOUTH ONCE A DAY  90 tablet  2  . liothyronine (CYTOMEL) 5 MCG tablet Take 5 mcg by mouth 3 (three) times daily.      . Multiple Vitamin (MULTIVITAMIN) tablet Take 1 tablet by mouth daily.        . Nutritional Supplements (DHEA PO) Take 5 mg by mouth daily.      . Nutritional Supplements (METABOLIC LIVER FORMULA PO) Take by mouth 2 (two) times daily. (Synergy)      . omeprazole (PRILOSEC) 20 MG capsule TAKE 1 CAPSULE BY MOUTH 2 TIMES DAILY.  60 capsule  2  . OVER THE COUNTER MEDICATION Take by mouth  daily. Omega marine oil-One tbsp      . OVER THE COUNTER MEDICATION Take 12.5 mg by mouth daily. iodine      . OVER THE COUNTER MEDICATION Take 200 mg by mouth daily. Salenomethionine      . Potassium Chromate POWD Take 600 mcg by mouth 2 (two) times daily.      . Probiotic Product (SOLUBLE FIBER/PROBIOTICS PO) Take by mouth daily.      . trace elements Cr-Cu-[id number]-I-Mn-Mo-[id number]-Zn, ADDAMEL-N, SOLN Take by mouth. 6-8 drops in fluids      . vitamin A 47829 UNIT capsule Take 25,000 Units by mouth daily.       No current facility-administered medications for this visit.    Review of Systems Review of Systems  Constitutional: Negative.   Respiratory: Negative.   Cardiovascular: Negative.     Blood pressure 140/78, height 5\' 6"  (1.676 m), weight 176 lb (79.833 kg).  Physical Exam Physical Exam Examination shows a 6+ centimeter lipoma beginning posterior to the midaxillary line. This is in the lower level of the right axilla.  Data Reviewed No data.  Assessment    Right  axillary lipoma.     Plan    Indication for excision was reviewed with the patient. She was amenable to proceed. A total of 20 cc of 0.5% Xylocaine with 0.25% Marcaine with 1:200,000 units of epinephrine was utilized well tolerated. Chlor prep was applied to the skin. A transverse incision was made. A multilobular lipoma was excised. Hemostasis was with 3-0 Vicryl ties. The deep tissue was approximated with 3-0 Vicryl to the adipose layer. A running 4-0 Vicryl subcutaneous suture for the skin. Benzoin and Steri-Strips followed by Telfa Tegaderm dressing was applied. The procedure was well-tolerated. Ice pack applied. The patient was instructed in regards to wound care. A prescription for Norco 5/325, #30 with the inscription 1-2 p.o. Q.4 h. P.r.n. For pain with no refills was provided. She will follow up with the nurse for wound evaluation in 7-10 days.       Earline Mayotte 01/17/2013, 8:37 PM

## 2013-01-17 ENCOUNTER — Encounter: Payer: Self-pay | Admitting: General Surgery

## 2013-01-17 LAB — PATHOLOGY

## 2013-01-18 ENCOUNTER — Telehealth: Payer: Self-pay

## 2013-01-18 NOTE — Telephone Encounter (Signed)
Spoke with patient and notified her of her results. She is very pleased. Will follow up with the nurse next week as scheduled.

## 2013-01-18 NOTE — Telephone Encounter (Signed)
Message copied by Sinda Du on Wed Jan 18, 2013  8:11 AM ------      Message from: [place], [place] W      Created: Tue Jan 17, 2013  4:57 PM       Please notify the patient that the tissue removed from the right axilla was just fat. No cancer. Follow up w/ RN next week as scheduled. Thanks.  ------

## 2013-01-25 ENCOUNTER — Ambulatory Visit (INDEPENDENT_AMBULATORY_CARE_PROVIDER_SITE_OTHER): Payer: BC Managed Care – PPO | Admitting: *Deleted

## 2013-01-25 DIAGNOSIS — D1739 Benign lipomatous neoplasm of skin and subcutaneous tissue of other sites: Secondary | ICD-10-CM

## 2013-01-25 DIAGNOSIS — D1720 Benign lipomatous neoplasm of skin and subcutaneous tissue of unspecified limb: Secondary | ICD-10-CM

## 2013-01-25 NOTE — Patient Instructions (Signed)
May use lotion/scar fade to soften the area

## 2013-01-25 NOTE — Progress Notes (Signed)
Patient came in today for a wound check.  The wound is clean, with no signs of infection noted. .Follow up as scheduled.  

## 2013-02-13 ENCOUNTER — Other Ambulatory Visit: Payer: Self-pay | Admitting: Family Medicine

## 2013-04-03 ENCOUNTER — Encounter: Payer: Self-pay | Admitting: Family Medicine

## 2013-04-03 ENCOUNTER — Ambulatory Visit (INDEPENDENT_AMBULATORY_CARE_PROVIDER_SITE_OTHER): Payer: BC Managed Care – PPO | Admitting: Family Medicine

## 2013-04-03 VITALS — BP 122/72 | HR 95 | Temp 98.9°F | Ht 65.75 in | Wt 177.8 lb

## 2013-04-03 DIAGNOSIS — Z7189 Other specified counseling: Secondary | ICD-10-CM | POA: Insufficient documentation

## 2013-04-03 DIAGNOSIS — Z23 Encounter for immunization: Secondary | ICD-10-CM

## 2013-04-03 NOTE — Progress Notes (Signed)
  Subjective:    Patient ID: Rebecca Kramer, female    DOB: 06-Oct-1945, 67 y.o.   MRN: [id number]  HPI Has a question about the high dose flu shot   Wanted to know about side effects    Review of Systems     Objective:   Physical Exam        Assessment & Plan:

## 2013-04-03 NOTE — Patient Instructions (Addendum)
Flu shot today (not the high dose) Take care of yourself  Please check out and tell the staff I requested refund of your copay- we are treating this as a nurse visit

## 2013-04-03 NOTE — Assessment & Plan Note (Signed)
Disc pros and cons of the high dose flu vaccine for seniors In light of increased side effect profile - pt chose the standard flu vaccine today

## 2013-05-10 ENCOUNTER — Other Ambulatory Visit: Payer: Self-pay

## 2013-05-10 DIAGNOSIS — Z9882 Breast implant status: Secondary | ICD-10-CM

## 2013-05-10 DIAGNOSIS — Z1231 Encounter for screening mammogram for malignant neoplasm of breast: Secondary | ICD-10-CM

## 2013-06-08 ENCOUNTER — Ambulatory Visit
Admission: RE | Admit: 2013-06-08 | Discharge: 2013-06-08 | Disposition: A | Discharge status: Home / Self Care | Payer: BC Managed Care – PPO | Source: Ambulatory Visit

## 2013-06-08 DIAGNOSIS — Z1231 Encounter for screening mammogram for malignant neoplasm of breast: Secondary | ICD-10-CM

## 2013-06-08 DIAGNOSIS — Z9882 Breast implant status: Secondary | ICD-10-CM

## 2013-06-09 ENCOUNTER — Ambulatory Visit (INDEPENDENT_AMBULATORY_CARE_PROVIDER_SITE_OTHER): Payer: BC Managed Care – PPO | Admitting: Family Medicine

## 2013-06-09 ENCOUNTER — Encounter: Payer: Self-pay | Admitting: Family Medicine

## 2013-06-09 VITALS — BP 146/92 | HR 107 | Temp 98.3°F | Ht 66.0 in | Wt 174.5 lb

## 2013-06-09 DIAGNOSIS — J329 Chronic sinusitis, unspecified: Secondary | ICD-10-CM | POA: Insufficient documentation

## 2013-06-09 DIAGNOSIS — J029 Acute pharyngitis, unspecified: Secondary | ICD-10-CM | POA: Insufficient documentation

## 2013-06-09 MED ORDER — AMOXICILLIN 500 MG PO CAPS: 500.0000 mg | ORAL_CAPSULE | Freq: Three times a day (TID) | ORAL | Status: DC

## 2013-06-09 MED ORDER — MUPIROCIN 2 % EX OINT: TOPICAL_OINTMENT | CUTANEOUS | Status: DC

## 2013-06-09 NOTE — Progress Notes (Signed)
Pre-visit discussion using our clinic review tool. No additional management support is needed unless otherwise documented below in the visit note.  

## 2013-06-09 NOTE — Progress Notes (Signed)
Subjective:    Patient ID: Rebecca Kramer, female    DOB: 06-04-1946, 67 y.o.   MRN: [id number]  HPI Here for a sore throat - for almost 2 weeks Started on one side and then spread to both  Got a little better and came back   A cough also - a little production/ not bad - a little yellow  No head congestion  Not a lot of drip    Of note - she uses a nasal cpap machine and the L nostil is very sore   No fever - no chills or sweats to speak of   Patient Active Problem List   Diagnosis Date Noted  . Immunization counseling 04/03/2013  . Routine general medical examination at a health care facility 10/11/2012  . Encounter for routine gynecological examination 10/11/2012  . Lipoma of axilla 10/11/2012  . OBSTRUCTIVE SLEEP APNEA 02/27/2010  . PURE HYPERCHOLESTEROLEMIA 02/05/2010  . ANAL OR RECTAL PAIN 11/07/2009  . ECZEMA 11/07/2009  . INFLAMED SEBORRHEIC KERATOSIS 10/29/2008  . OSTEOPENIA 10/08/2008  . HYPOTHYROIDISM 10/29/2006  . HYPERTENSION 10/29/2006  . REFLUX, ESOPHAGEAL 10/29/2006   Past Medical History  Diagnosis Date  . Hypertension   . Hypothyroidism   . Diverticulosis   . GERD (gastroesophageal reflux disease)     esophagitis with ulceration  . Osteopenia   . Bone spur     Left foot / posterior  . Menopausal symptoms     hot flashes  . Arthritis   . Sleep apnea 2007    AHI 45/hr in 2007  . Hyperthyroidism    Past Surgical History  Procedure Laterality Date  . Abdominal hysterectomy  1983    partial/endometriosis  . Breast surgery  1970's    breast lumpectomy/benign  . Breast surgery  1994    breast implants/saline  . Thumb nodule      removed  . Esophagogastroduodenoscopy  03/2008    esophagitis with ulcerations/?barretts  . Tonsillectomy    . Dorsal compartment release  05/14/2011    Procedure: RELEASE DORSAL COMPARTMENT (DEQUERVAIN);  Surgeon: Wyn Forster., MD;  [facility]: [facility];  Service: Orthopedics;  Laterality:  Right;  right release dorsal compartment/ de quervain release with debridement of mucoid cyst   History  Substance Use Topics  . Smoking status: Never Smoker   . Smokeless tobacco: Never Used  . Alcohol Use: No   Family History  Problem Relation Age of Onset  . Hypertension Mother   . Hyperlipidemia Mother   . Colon polyps Mother   . Heart disease Mother   . Migraines Mother   . Cancer Father     lung CA  . Migraines Brother   . Cancer Maternal Aunt     breast CA   No Known Allergies Current Outpatient Prescriptions on File Prior to Visit  Medication Sig Dispense Refill  . BIOTIN PO Take 8 mg by mouth daily.      . Cholecalciferol (VITAMIN D3) 1000 UNIT/SPRAY LIQD Take 1,000 Units by mouth 3 (three) times daily.      Marland Kitchen CINNAMON PO Take by mouth daily. 1/2 tsp      . fish oil-omega-3 fatty acids 1000 MG capsule Take 1 g by mouth daily.      Marland Kitchen GLYCINE PO Take by mouth at bedtime as needed.       . hydrochlorothiazide (HYDRODIURIL) 25 MG tablet TAKE 1 TABLET (25 MG TOTAL) BY MOUTH DAILY.  90 tablet  2  .  levothyroxine (SYNTHROID, LEVOTHROID) 88 MCG tablet TAKE 1 TABLET BY MOUTH ONCE A DAY  90 tablet  2  . liothyronine (CYTOMEL) 5 MCG tablet Take 5 mcg by mouth 2 (two) times daily.       Marland Kitchen loratadine-pseudoephedrine (CLARITIN-D 24-HOUR) 10-240 MG per 24 hr tablet Take 1 tablet by mouth daily as needed for allergies.      . Nutritional Supplements (DHEA PO) Take 5 mg by mouth daily.      . Nutritional Supplements (METABOLIC LIVER FORMULA PO) Take by mouth 2 (two) times daily. (Synergy)      . omeprazole (PRILOSEC) 20 MG capsule       . OVER THE COUNTER MEDICATION Take 12.5 mg by mouth daily. iodine      . OVER THE COUNTER MEDICATION Take 200 mg by mouth daily. Salenomethionine      . Probiotic Product (SOLUBLE FIBER/PROBIOTICS PO) Take by mouth daily.      . vitamin A 16109 UNIT capsule Take 25,000 Units by mouth daily.       No current facility-administered medications on file  prior to visit.     Review of Systems    Review of Systems  Constitutional: Negative for fever, appetite change, fatigue and unexpected weight change.  Eyes: Negative for pain and visual disturbance.  ENT neg for ear pain or sinus pain / pos for ST Respiratory: Negative for cough and shortness of breath.   Cardiovascular: Negative for cp or palpitations    Gastrointestinal: Negative for nausea, diarrhea and constipation.  Genitourinary: Negative for urgency and frequency.  Skin: Negative for pallor or rash   Neurological: Negative for weakness, light-headedness, numbness and headaches.  Hematological: Negative for adenopathy. Does not bruise/bleed easily.  Psychiatric/Behavioral: Negative for dysphoric mood. The patient is not nervous/anxious.      Objective:   Physical Exam  Constitutional: She appears well-developed and well-nourished.  HENT:  Head: Normocephalic.  Right Ear: External ear normal.  Left Ear: External ear normal.  Tender over upper L nostril and maxillary sinus  Throat is clear Some clear rhinorrhea and post nasal drip with frequent throat clearing   Eyes: Conjunctivae and EOM are normal. Pupils are equal, round, and reactive to light. Right eye exhibits no discharge. Left eye exhibits no discharge.  Neck: Normal range of motion. Neck supple.  Cardiovascular: Normal rate and regular rhythm.   Pulmonary/Chest: Effort normal and breath sounds normal. No respiratory distress. She has no wheezes. She has no rales.  Lymphadenopathy:    She has no cervical adenopathy.  Neurological: She is alert.  Skin: Skin is warm and dry. No rash noted.  Psychiatric: She has a normal mood and affect.          Assessment & Plan:

## 2013-06-09 NOTE — Patient Instructions (Signed)
Take plain claritin daily  Use the bactroban ointment in nostril until improved Take the amoxicillin as directed  If sore throat does not improve in 10 days or if worse please let me know

## 2013-06-11 NOTE — Assessment & Plan Note (Signed)
Suspect from a sinus infection s/p uri  Disc symptomatic care - see instructions on AVS  amox covered with  Neg strep test

## 2013-06-11 NOTE — Assessment & Plan Note (Addendum)
Atypical presentation- with ongoing ST and drip  Cover with  amox  Disc symptomatic care - see instructions on AVS  Update if not starting to improve in a week or if worsening   tx papule/ sore in nostril with bactroban

## 2013-07-27 ENCOUNTER — Ambulatory Visit (INDEPENDENT_AMBULATORY_CARE_PROVIDER_SITE_OTHER): Payer: BC Managed Care – PPO | Admitting: Internal Medicine

## 2013-07-27 ENCOUNTER — Encounter: Payer: Self-pay | Admitting: Internal Medicine

## 2013-07-27 VITALS — BP 126/84 | HR 71 | Temp 98.0°F | Wt 175.8 lb

## 2013-07-27 DIAGNOSIS — H6980 Other specified disorders of Eustachian tube, unspecified ear: Secondary | ICD-10-CM

## 2013-07-27 DIAGNOSIS — H6590 Unspecified nonsuppurative otitis media, unspecified ear: Secondary | ICD-10-CM

## 2013-07-27 DIAGNOSIS — J309 Allergic rhinitis, unspecified: Secondary | ICD-10-CM

## 2013-07-27 MED ORDER — CHLORTHALIDONE 25 MG PO TABS
25.0000 mg | ORAL_TABLET | Freq: Every day | ORAL | Status: DC
Start: 2013-07-27 — End: 2014-05-22

## 2013-07-27 NOTE — Patient Instructions (Signed)
Barotitis Media  Barotitis media is inflammation of your middle ear. This occurs when the auditory tube (eustachian tube) leading from the back of your nose (nasopharynx) to your eardrum is blocked. This blockage may result from a cold, environmental allergies, or an upper respiratory infection. Unresolved barotitis media may lead to damage or hearing loss (barotrauma), which may become permanent.  HOME CARE INSTRUCTIONS   · Use medicines as recommended by your health care provider. Over-the-counter medicines will help unblock the canal and can help during times of air travel.  · Do not put anything into your ears to clean or unplug them. Eardrops will not be helpful.  · Do not swim, dive, or fly until your health care provider says it is all right to do so. If these activities are necessary, chewing gum with frequent, forceful swallowing may help. It is also helpful to hold your nose and gently blow to pop your ears for equalizing pressure changes. This forces air into the eustachian tube.  · Only take over-the-counter or prescription medicines for pain, discomfort, or fever as directed by your health care provider.  · A decongestant may be helpful in decongesting the middle ear and make pressure equalization easier.  SEEK MEDICAL CARE IF:  · You experience a serious form of dizziness in which you feel as if the room is spinning and you feel nauseated (vertigo).  · Your symptoms only involve one ear.  SEEK IMMEDIATE MEDICAL CARE IF:   · You develop a severe headache, dizziness, or severe ear pain.  · You have bloody or pus-like drainage from your ears.  · You develop a fever.  · Your problems do not improve or become worse.  MAKE SURE YOU:   · Understand these instructions.  · Will watch your condition.  · Will get help right away if you are not doing well or get worse.  Document Released: 06/19/2000 Document Revised: 04/12/2013 Document Reviewed: 01/17/2013  [employer]® Patient Information ©2014 [employer].

## 2013-07-27 NOTE — Progress Notes (Signed)
HPI  Pt presents to the clinic today with c/o ear fullness and facial pressure. This started about 1 week ago. She does report decreased hearing out of the left ear, but no pain. She is taking Claritin which is helping with the sinus issues but no the ear fullness. She denies fever or purulent drainage. She was recently treated for sinusitis with Amoxil 06/09/2013.  Review of Systems    Past Medical History  Diagnosis Date  . Hypertension   . Hypothyroidism   . Diverticulosis   . GERD (gastroesophageal reflux disease)     esophagitis with ulceration  . Osteopenia   . Bone spur     Left foot / posterior  . Menopausal symptoms     hot flashes  . Arthritis   . Sleep apnea 2007    AHI 45/hr in 2007  . Hyperthyroidism     Family History  Problem Relation Age of Onset  . Hypertension Mother   . Hyperlipidemia Mother   . Colon polyps Mother   . Heart disease Mother   . Migraines Mother   . Cancer Father     lung CA  . Migraines Brother   . Cancer Maternal Aunt     breast CA    History   Social History  . Marital Status: Married    Spouse Name: N/A    Number of Children: N/A  . Years of Education: N/A   Occupational History  . Not on file.   Social History Main Topics  . Smoking status: Never Smoker   . Smokeless tobacco: Never Used  . Alcohol Use: No  . Drug Use: No  . Sexual Activity: Not on file   Other Topics Concern  . Not on file   Social History Narrative  . No narrative on file    No Known Allergies   Constitutional:  Denies headache, fatigue, fever or abrupt weight changes.  HEENT:  Positive ear fullness, nasal congestion. Denies eye redness, ear pain, ringing in the ears, wax buildup, runny nose or bloody nose. Respiratory:  Denies difficulty breathing or shortness of breath.  Cardiovascular: Denies chest pain, chest tightness, palpitations or swelling in the hands or feet.   No other specific complaints in a complete review of systems (except  as listed in HPI above).  Objective:    BP 126/84  Pulse 71  Temp(Src) 98 F (36.7 C) (Oral)  Wt 175 lb 12 oz (79.72 kg)  SpO2 98% Wt Readings from Last 3 Encounters:  07/27/13 175 lb 12 oz (79.72 kg)  06/09/13 174 lb 8 oz (79.153 kg)  04/03/13 177 lb 12 oz (80.627 kg)    General: Appears her stated age, well developed, well nourished in NAD. HEENT: Head: normal shape and size; Eyes: sclera white, no icterus, conjunctiva pink, PERRLA and EOMs intact; Ears: Tm's gray and intact, normal light reflex, + effusion on the left; Nose: mucosa pink and moist, septum midline; Throat/Mouth: + PND. Teeth present, mucosa pink and moist, no exudate noted, no lesions or ulcerations noted.  Neck:  Neck supple, trachea midline. No massses, lumps or thyromegaly present.  Cardiovascular: Normal rate and rhythm. S1,S2 noted.  No murmur, rubs or gallops noted. No JVD or BLE edema. No carotid bruits noted. Pulmonary/Chest: Normal effort and positive vesicular breath sounds. No respiratory distress. No wheezes, rales or ronchi noted.      Assessment & Plan:   Allergic Rhinitis with effusion and tube dysfunction of left ear  Continue Claritin OTC  May benefit from a intranasal steroid- she declines at this time eRx for Hygroten x 3-5 days  RTC as needed or if symptoms persist.

## 2013-07-27 NOTE — Progress Notes (Signed)
Pre-visit discussion using our clinic review tool. No additional management support is needed unless otherwise documented below in the visit note.  

## 2013-07-28 ENCOUNTER — Other Ambulatory Visit: Payer: Self-pay | Admitting: Family Medicine

## 2013-08-23 ENCOUNTER — Other Ambulatory Visit: Payer: Self-pay

## 2013-08-23 DIAGNOSIS — H6980 Other specified disorders of Eustachian tube, unspecified ear: Secondary | ICD-10-CM

## 2013-08-23 NOTE — Telephone Encounter (Signed)
Last filled 07/27/13--please advise

## 2013-08-30 ENCOUNTER — Other Ambulatory Visit: Payer: Self-pay

## 2013-08-30 DIAGNOSIS — H6980 Other specified disorders of Eustachian tube, unspecified ear: Secondary | ICD-10-CM

## 2013-09-20 ENCOUNTER — Encounter: Payer: Self-pay | Admitting: Gastroenterology

## 2013-11-07 ENCOUNTER — Encounter: Payer: Self-pay | Admitting: Gastroenterology

## 2013-11-07 ENCOUNTER — Ambulatory Visit (INDEPENDENT_AMBULATORY_CARE_PROVIDER_SITE_OTHER): Payer: BC Managed Care – PPO | Admitting: Gastroenterology

## 2013-11-07 VITALS — BP 120/88 | HR 76 | Ht 65.75 in | Wt 180.6 lb

## 2013-11-07 DIAGNOSIS — K219 Gastro-esophageal reflux disease without esophagitis: Secondary | ICD-10-CM

## 2013-11-07 NOTE — Patient Instructions (Signed)
Wean off the omeprazole (10mg  every other day for a week) then stop completely. Take a zantac or pepcid (buy the generic version) as needed at bedtime. Call if any trouble.

## 2013-11-07 NOTE — Progress Notes (Signed)
Review of pertinent gastrointestinal problems: 1. Routine risk for colon cancer:  Colonoscopy 03/2008 Juli Odom, no polyps, recommended recall screening exam at 10 years 2. GERD. EGD 03/2008 Ardis Hughs found ulcerative esophagitis above 3cm HH; biopsies showed no Barrett's changes.     HPI: This is a   very pleasant 68 year old woman whom I last saw about 5 or 6 years ago  She is here to discuss acid reducing medicines   Was taking PPI twice daily.    Recently been on omeprazole 10mg  once daily.  Weight fluctuates.  No real dietary changes.  No dysphagia.  No overt GI bleeding.  Eating late, laying down soon will cause pyrosis.  CPAP at night.   Review of systems: Pertinent positive and negative review of systems were noted in the above HPI section. Complete review of systems was performed and was otherwise normal.    Past Medical History  Diagnosis Date  . Hypertension   . Hypothyroidism   . Diverticulosis   . GERD (gastroesophageal reflux disease)     esophagitis with ulceration  . Osteopenia   . Bone spur     Left foot / posterior  . Menopausal symptoms     hot flashes  . Arthritis   . Sleep apnea 2007    AHI 45/hr in 2007  . Hyperthyroidism     Past Surgical History  Procedure Laterality Date  . Abdominal hysterectomy  1983    partial/endometriosis  . Breast surgery  1970's    breast lumpectomy/benign  . Breast surgery  1994    breast implants/saline  . Thumb nodule      removed  . Esophagogastroduodenoscopy  03/2008    esophagitis with ulcerations/?barretts  . Tonsillectomy    . Dorsal compartment release  05/14/2011    Procedure: RELEASE DORSAL COMPARTMENT (DEQUERVAIN);  Surgeon: Cammie Sickle., MD;  Location: [facility];  Service: Orthopedics;  Laterality: Right;  right release dorsal compartment/ de quervain release with debridement of mucoid cyst    Current Outpatient Prescriptions  Medication Sig Dispense Refill  . chlorthalidone  (HYGROTON) 25 MG tablet Take 1 tablet (25 mg total) by mouth daily.  5 tablet  0  . Cholecalciferol (VITAMIN D3) 1000 UNIT/SPRAY LIQD Take 1,000 Units by mouth 3 (three) times daily.      . fish oil-omega-3 fatty acids 1000 MG capsule Take 1 g by mouth daily.      . hydrochlorothiazide (HYDRODIURIL) 25 MG tablet TAKE 1 TABLET (25 MG TOTAL) BY MOUTH DAILY.  90 tablet  2  . liothyronine (CYTOMEL) 5 MCG tablet Take 5 mcg by mouth 2 (two) times daily.       Marland Kitchen loratadine-pseudoephedrine (CLARITIN-D 24-HOUR) 10-240 MG per 24 hr tablet Take 1 tablet by mouth daily as needed for allergies.      . metFORMIN (GLUCOPHAGE) 500 MG tablet Take 500 mg by mouth 2 (two) times daily with a meal.      . omeprazole (PRILOSEC) 20 MG capsule TAKE 1 CAPSULE BY MOUTH 2 TIMES DAILY.  60 capsule  2  . Probiotic Product (SOLUBLE FIBER/PROBIOTICS PO) Take by mouth daily.      . Thyroid (NATURE-THROID) 81.25 MG TABS Take 1 tablet by mouth daily.       No current facility-administered medications for this visit.    Allergies as of 11/07/2013  . (No Known Allergies)    Family History  Problem Relation Age of Onset  . Hypertension Mother   . Hyperlipidemia Mother   .  Colon polyps Mother   . Heart disease Mother   . Migraines Mother   . Cancer Father     lung CA  . Migraines Brother   . Cancer Maternal Aunt     breast CA    History   Social History  . Marital Status: Married    Spouse Name: N/A    Number of Children: N/A  . Years of Education: N/A   Occupational History  . Not on file.   Social History Main Topics  . Smoking status: Never Smoker   . Smokeless tobacco: Never Used  . Alcohol Use: No  . Drug Use: No  . Sexual Activity: Not on file   Other Topics Concern  . Not on file   Social History Narrative  . No narrative on file       Physical Exam: BP 120/88  Pulse 76  Ht 5' 5.75" (1.67 m)  Wt 180 lb 9.6 oz (81.92 kg)  BMI 29.37 kg/m2 Constitutional: generally  well-appearing Psychiatric: alert and oriented x3 Eyes: extraocular movements intact Mouth: oral pharynx moist, no lesions Neck: supple no lymphadenopathy Cardiovascular: heart regular rate and rhythm Lungs: clear to auscultation bilaterally Abdomen: soft, nontender, nondistended, no obvious ascites, no peritoneal signs, normal bowel sounds Extremities: no lower extremity edema bilaterally Skin: no lesions on visible extremities    Assessment and plan: 68 y.o. female with  previous GERD, ulcerative esophagitis, routine risk for colon cancer  She has noticed that her GERD symptoms have over time become much easier to control. This is over several years. She is down to a single 10 mg omeprazole once daily. Even on that regimen she has no GERD complaints and no alarm symptoms. She asked about tapering further told her seems completely safe to me she will take 1 of those pills every other day for a week and then come off completely. I also explained that H2 blocker at night or on a when necessary basis is also helpful. She knows to call she has any further questions or concerns. She is not due for colon cancer screening for another 4 years or so.

## 2013-12-26 ENCOUNTER — Other Ambulatory Visit: Payer: Self-pay | Admitting: Family Medicine

## 2014-04-04 ENCOUNTER — Telehealth: Payer: Self-pay | Admitting: Family Medicine

## 2014-04-04 DIAGNOSIS — E039 Hypothyroidism, unspecified: Secondary | ICD-10-CM

## 2014-04-04 NOTE — Telephone Encounter (Signed)
Pt called to request labs per previous MD Sharol Roussel, she was supposed to have labs in November and in January. Do we need to schedule her for a lab visit?

## 2014-04-04 NOTE — Telephone Encounter (Signed)
Can you clarify that? Let her know we are legally not allowed to do labs for other doctors (or for conditions other doctors are taking care of )- is she still seeing Dr Sharol Roussel? What labs does she need ? I think she has a PE here in the spring or summer Let me know -- sorry this message confuses me

## 2014-04-06 NOTE — Telephone Encounter (Signed)
Spoke to patient by telephone and was advised that she no longer is seeing Dr. Sharol Roussel. Patient stated that Dr. Sharol Roussel advised her to have some thyroid test done. Patient is not at home at this time and will call back with exactly the test that need to be done.

## 2014-04-09 NOTE — Telephone Encounter (Signed)
I reviewed a note from Dr Sharol Roussel from 03/26/14- she is due for thyroid labs tsh. Free T4 and free T3 on 05/07/14 Patient should already know that I am not a holistic doctor like Dr Sharol Roussel so my methodology is a bit different-but I will put a future lab order in for these and I think she already has her PE scheduled with me in the future

## 2014-04-11 NOTE — Telephone Encounter (Signed)
Pt notified of Dr. Marliss Coots comments. Pt scheduled lab appt. Pt has copy of check out papers from Dr. Sharol Roussel and wanted me to ask you if you could order a Reverse T3 like Dr. Sharol Roussel wanted also with the labs in Nov.  Also pt said she is due for labs in Jan 2016 and her CPE with you isn't until May 2016 so she wanted to know if she could have the labs Dr. Sharol Roussel wanted her to have in Jan too. Pt said she will need a Hemoglobin, a1c, vit. A level, and vit D level per Dr. Sharol Roussel

## 2014-04-11 NOTE — Telephone Encounter (Signed)
I do not order those since I am not a specialist and also would not know what to do with the result  If she would rather see an endocrinologist (thyroid specialist) - I can refer her

## 2014-04-12 NOTE — Telephone Encounter (Signed)
Left voicemail requesting pt to call office back 

## 2014-04-24 ENCOUNTER — Telehealth: Payer: Self-pay | Admitting: Gastroenterology

## 2014-04-24 NOTE — Telephone Encounter (Signed)
Returned call no answer no voicemail.

## 2014-04-26 NOTE — Telephone Encounter (Signed)
No answer and no voice mail, I will wait for further communication from the pt.

## 2014-04-26 NOTE — Addendum Note (Signed)
Addended by: Loura Pardon A on: 04/26/2014 08:04 PM   Modules accepted: Orders

## 2014-04-26 NOTE — Telephone Encounter (Signed)
Pt does want to see Endocrinologist, please put referral in I advise pt Marion/Linda will call to schedule appt., pt is okay with seeing a [place] endo doc

## 2014-04-27 ENCOUNTER — Telehealth: Payer: Self-pay | Admitting: Gastroenterology

## 2014-04-30 MED ORDER — OMEPRAZOLE 10 MG PO CPDR: 10.0000 mg | DELAYED_RELEASE_CAPSULE | Freq: Every day | ORAL | Status: DC

## 2014-04-30 NOTE — Telephone Encounter (Signed)
The pt wants to go back to 10 mg omeprazole I have sent that rx as requested.

## 2014-05-08 ENCOUNTER — Other Ambulatory Visit (INDEPENDENT_AMBULATORY_CARE_PROVIDER_SITE_OTHER): Payer: BC Managed Care – PPO

## 2014-05-08 DIAGNOSIS — E039 Hypothyroidism, unspecified: Secondary | ICD-10-CM

## 2014-05-08 LAB — T4, FREE: Free T4: 1.02 ng/dL (ref 0.60–1.60)

## 2014-05-08 LAB — T3, FREE: T3, Free: 2.8 pg/mL (ref 2.3–4.2)

## 2014-05-08 LAB — TSH: TSH: 1.04 u[IU]/mL (ref 0.35–4.50)

## 2014-05-09 ENCOUNTER — Encounter: Payer: Self-pay | Admitting: *Deleted

## 2014-05-09 ENCOUNTER — Other Ambulatory Visit: Payer: Self-pay

## 2014-05-09 DIAGNOSIS — Z1231 Encounter for screening mammogram for malignant neoplasm of breast: Secondary | ICD-10-CM

## 2014-05-22 ENCOUNTER — Ambulatory Visit (INDEPENDENT_AMBULATORY_CARE_PROVIDER_SITE_OTHER): Payer: BC Managed Care – PPO | Admitting: Internal Medicine

## 2014-05-22 ENCOUNTER — Encounter: Payer: Self-pay | Admitting: Internal Medicine

## 2014-05-22 VITALS — BP 110/68 | HR 72 | Temp 98.2°F | Resp 12 | Ht 66.0 in | Wt 187.0 lb

## 2014-05-22 DIAGNOSIS — E039 Hypothyroidism, unspecified: Secondary | ICD-10-CM

## 2014-05-22 NOTE — Patient Instructions (Signed)
Take the thyroid hormone every day, with water, >30 minutes before breakfast, separated by >4 hours from acid reflux medications, calcium, iron, multivitamins.  Continue the same dose of Levothyroxine, 100 mcg daily in am.  Come back for labs in 2 months.  Please come back for a visit in 6 months.

## 2014-05-22 NOTE — Progress Notes (Signed)
Patient ID: Rebecca Kramer, female   DOB: [birth year], 68 y.o.   MRN: [id number]    HPI  Rebecca Kramer is a 68 y.o.-year-old female, referred by her PCP, Dr.Tower, for management of Hashimoto hypothyroidism. She was seeing Dr Sharol Roussel [facility]) >> stopped seeing her 2/2 cost.   Pt. has been dx with hypothyroidism "many years ago"; was on: - generic LT4 >> now back on this - Synthroid DAW - Naturethroid 81.5 mg + Cytomel 5 mg bid (equivalent to ~180 mcg LT4) >> stopped 6 mo ago She is now on Levothyroxine 100 mcg daily (increased from 88 mcg in 03/2014)  She takes the thyroid hh: - fasting - with water - separated by >30 min from b'fast  - no calcium, iron, multivitamins  - + PPIs in am!  I reviewed pt's thyroid tests: Lab Results  Component Value Date   TSH 1.04 05/08/2014   TSH 0.12* 10/07/2012   TSH 3.56 07/17/2011   TSH 5.07 11/06/2009   TSH 0.12* 10/08/2008   TSH 1.51 08/05/2007   FREET4 1.02 05/08/2014   FREET4 1.4 10/08/2008   03/14/2014: ATA 313 (<2) TPO Abs 39 (<9) TSH 3.552 (0.35-4.5), free T4 0.82 (0.8-1.8), Free T3 2.6 (2.3-4.2), reverse T3 12 (8-25) At the same time, she hadn't hemoglobin A1c 6.3, fasting insulin 16.6, vitamin D 66  Pt describes: - + weight gain (despite Paleo diet; shakes; fruit + veggies + lean meats) - stable weight for the last 3-4 years - + fatigue  - no cold intolerance, + hot flashes - no depression - no constipation - no dry skin - + some hair loss  Pt denies feeling nodules in neck, hoarseness, has some dysphagia from her reflux/no odynophagia, SOB with lying down.  She has + FH of thyroid disorders in: mother. No FH of thyroid cancer.  No h/o radiation tx to head or neck. No recent use of iodine supplements.  Exercises at [employer] 3 times a week.  ROS: Constitutional: + see HPI, + poor sleep Eyes: no blurry vision, no xerophthalmia ENT: no sore throat, no nodules palpated in throat, no  dysphagia/odynophagia, no hoarseness Cardiovascular: no CP/SOB/palpitations/leg swelling Respiratory: + occasional cough/no SOB Gastrointestinal: no N/V/D/C, + acid reflux Musculoskeletal: no muscle/joint aches Skin: no rashes, + mild hair loss Neurological: no tremors/numbness/tingling/dizziness Psychiatric: no depression/anxiety  Past Medical History  Diagnosis Date  . Hypertension   . Hypothyroidism   . Diverticulosis   . GERD (gastroesophageal reflux disease)     esophagitis with ulceration  . Osteopenia   . Bone spur     Left foot / posterior  . Menopausal symptoms     hot flashes  . Arthritis   . Sleep apnea 2007    AHI 45/hr in 2007  . Hyperthyroidism    Past Surgical History  Procedure Laterality Date  . Abdominal hysterectomy  1983    partial/endometriosis  . Breast surgery  1970's    breast lumpectomy/benign  . Breast surgery  1994    breast implants/saline  . Thumb nodule      removed  . Esophagogastroduodenoscopy  03/2008    esophagitis with ulcerations/?barretts  . Tonsillectomy    . Dorsal compartment release  05/14/2011    Procedure: RELEASE DORSAL COMPARTMENT (DEQUERVAIN);  Surgeon: Cammie Sickle., MD;  Location: [facility];  Service: Orthopedics;  Laterality: Right;  right release dorsal compartment/ de quervain release with debridement of mucoid cyst   History   Social History  .  Marital Status: Married    Spouse Name: N/A    Number of Children: 2   Occupational History  . retired   Social History Main Topics  . Smoking status: Never Smoker   . Smokeless tobacco: Never Used  . Alcohol Use: No  . Drug Use: No   Current Outpatient Prescriptions on File Prior to Visit  Medication Sig Dispense Refill  . Cholecalciferol (VITAMIN D3) 1000 UNIT/SPRAY LIQD Take 1,000 Units by mouth 3 (three) times daily.    . fish oil-omega-3 fatty acids 1000 MG capsule Take 1 g by mouth daily.    . hydrochlorothiazide (HYDRODIURIL) 25 MG  tablet Take 1 tablet (25 mg total) by mouth daily. *follow-up appt. required before future refills are given* 90 tablet 0  . metFORMIN (GLUCOPHAGE) 500 MG tablet Take 500 mg by mouth 2 (two) times daily with a meal.    . omeprazole (PRILOSEC) 10 MG capsule Take 1 capsule (10 mg total) by mouth daily. 30 capsule 11  . chlorthalidone (HYGROTON) 25 MG tablet Take 1 tablet (25 mg total) by mouth daily. 5 tablet 0  . liothyronine (CYTOMEL) 5 MCG tablet Take 5 mcg by mouth 2 (two) times daily.     Marland Kitchen loratadine-pseudoephedrine (CLARITIN-D 24-HOUR) 10-240 MG per 24 hr tablet Take 1 tablet by mouth daily as needed for allergies.    . Probiotic Product (SOLUBLE FIBER/PROBIOTICS PO) Take by mouth daily.    . Thyroid (NATURE-THROID) 81.25 MG TABS Take 1 tablet by mouth daily.     No current facility-administered medications on file prior to visit.   Allergies  Allergen Reactions  . Dilaudid [Hydromorphone Hcl] Other (See Comments)    Face turned red; bp   Family History  Problem Relation Age of Onset  . Hypertension Mother   . Hyperlipidemia Mother   . Colon polyps Mother   . Heart disease Mother   . Migraines Mother   . Cancer Father     lung CA  . Migraines Brother   . Cancer Maternal Aunt     breast CA   PE: BP 110/68 mmHg  Pulse 72  Temp(Src) 98.2 F (36.8 C) (Oral)  Resp 12  Ht 5\' 6"  (1.676 m)  Wt 187 lb (84.823 kg)  BMI 30.20 kg/m2  SpO2 96% Wt Readings from Last 3 Encounters:  05/22/14 187 lb (84.823 kg)  11/07/13 180 lb 9.6 oz (81.92 kg)  07/27/13 175 lb 12 oz (79.72 kg)   Constitutional: overweight, in NAD Eyes: PERRLA, EOMI, no exophthalmos ENT: moist mucous membranes, no thyromegaly, no cervical lymphadenopathy Cardiovascular: RRR, No MRG Respiratory: CTA B Gastrointestinal: abdomen soft, NT, ND, BS+ Musculoskeletal: no deformities, strength intact in all 4 Skin: moist, warm, no rashes Neurological: no tremor with outstretched hands, DTR normal in all  4  ASSESSMENT: 1. Hypothyroidism - Hashimoto's thyroiditis  PLAN:  1. Patient with long-standing hypothyroidism, on levothyroxine therapy. She appears euthyroid. She does not appear to have a goiter, thyroid nodules, or neck compression symptoms - she is now on generic LT4 but was on Naturethroid + Cytomel in the past by her integrative therapist. She did not feel much different on that regimen. We discussed  About plusses and minuses of the purified thyroid extract and of using T3 hormones combined with T4. She agrees to continue the current LT4 formulation - We discussed about correct intake of levothyroxine, fasting, with water, separated by at least 30 minutes from breakfast, and separated by more than 4 hours from calcium, iron,  multivitamins, acid reflux medications (PPIs). She is now taking the PPIs in am >> advised to move her Omeprazole at lunch or later. - we reviewed together her previous TFTs (normal, 2 weeks ago) and her thyroid Abs (positive >> she has a dx of Hashimoto's thyroiditis as a cause for her hypothyroidism >> she was not aware of this - will check thyroid tests in 2 mo: TSH, free T4 - she wanted me to check a reverse T3 level, but I explained why this is unnecessary - If these are abnormal, she will need to return in 6-8 weeks for repeat labs - If these are normal, I will see her back in 6 months  - time spent with the patient: 45 min, of which >50% was spent in obtaining information about her symptoms, reviewing her previous labs, evaluations, and treatments, counseling her about her condition (please see the discussed topics above), and developing a plan to further investigate it. She had a number of questions which I addressed, including about diet and exercise, diabetes tx (she is diabetic, on Metformin) and PPI use.

## 2014-05-24 ENCOUNTER — Ambulatory Visit: Payer: BC Managed Care – PPO | Admitting: Internal Medicine

## 2014-06-11 ENCOUNTER — Ambulatory Visit
Admission: RE | Admit: 2014-06-11 | Discharge: 2014-06-11 | Disposition: A | Discharge status: Home / Self Care | Payer: BC Managed Care – PPO | Source: Ambulatory Visit

## 2014-06-11 DIAGNOSIS — Z1231 Encounter for screening mammogram for malignant neoplasm of breast: Secondary | ICD-10-CM

## 2014-06-12 ENCOUNTER — Encounter: Payer: Self-pay | Admitting: *Deleted

## 2014-07-17 ENCOUNTER — Telehealth: Payer: Self-pay | Admitting: Pulmonary Disease

## 2014-07-17 DIAGNOSIS — G4733 Obstructive sleep apnea (adult) (pediatric): Secondary | ICD-10-CM

## 2014-07-17 NOTE — Telephone Encounter (Signed)
Spoke with pt--states that CPAP pressure seems to be too much. Has not been adjusted since 2013(last seen in office 12/2011). Would like pressure decreased. Also, pt states that her mask is not fitting well either as it used to be. Pt states that everything is newer on her nasal mask(head gear, seal, pillows, etc...). Pt reports not losing any weight since last seen. Appt scheduled with [facility] 08/03/14 at 1030a to follow up CPAP. Pt aware to bring machine and mask to appt with her.   Please advise Dr Gwenette Greet. Thanks.

## 2014-07-17 NOTE — Telephone Encounter (Signed)
Pt states that her husband messed with her pressure setting trying to adjust it a while back and pt states that she does not think that it is set on Auto any longer. Okay with changing to pressure of 10 fixed. Will call us back if there is any issue with intolerance. Order placed to [place]. Nothing further needed.

## 2014-07-17 NOTE — Telephone Encounter (Signed)
[phone] returning call

## 2014-07-17 NOTE — Telephone Encounter (Signed)
Last note that I have in the chart is that she is on the auto mode, and therefore the pressure will adjust according to her needs during the night.  If she wishes, can set her machine on a fixed pressure of 10 until I see her on the 29th.  If she wishes to do this, it is ok.

## 2014-07-17 NOTE — Telephone Encounter (Signed)
lmtcb for pt.  

## 2014-07-23 ENCOUNTER — Other Ambulatory Visit: Payer: BC Managed Care – PPO

## 2014-07-23 ENCOUNTER — Other Ambulatory Visit (INDEPENDENT_AMBULATORY_CARE_PROVIDER_SITE_OTHER): Payer: BLUE CROSS/BLUE SHIELD

## 2014-07-23 DIAGNOSIS — E039 Hypothyroidism, unspecified: Secondary | ICD-10-CM

## 2014-07-23 LAB — TSH: TSH: 0.79 u[IU]/mL (ref 0.35–4.50)

## 2014-07-23 LAB — T4, FREE: Free T4: 1.23 ng/dL (ref 0.60–1.60)

## 2014-08-03 ENCOUNTER — Encounter: Payer: Self-pay | Admitting: Pulmonary Disease

## 2014-08-03 ENCOUNTER — Ambulatory Visit (INDEPENDENT_AMBULATORY_CARE_PROVIDER_SITE_OTHER): Payer: BLUE CROSS/BLUE SHIELD | Admitting: Pulmonary Disease

## 2014-08-03 VITALS — BP 124/72 | HR 79 | Temp 97.6°F | Ht 66.0 in | Wt 190.6 lb

## 2014-08-03 DIAGNOSIS — G4733 Obstructive sleep apnea (adult) (pediatric): Secondary | ICD-10-CM

## 2014-08-03 NOTE — Patient Instructions (Signed)
Will have your cpap machine reset on auto 5-20cm Will have your home care company show you different masks.  Look at "respironics dreamwear mask". Work on weight loss followup with me in a year if doing well, but call if you continue to struggle with cpap.  We can then work on further troubleshooting vs trying dental appliance.

## 2014-08-03 NOTE — Assessment & Plan Note (Signed)
The patient is having issues with her C Pap related to mask fit as well as pressure. Her husband apparently changed her pressure, and she is unsure of the level at this time. I will work with her getting her pressure readjusted and also having her home care company work with her on a better mask fit.  However, I have also discussed with her trying a dental appliance. She understands this will not completely treat her degree of sleep apnea, but may be enough to help her sleep and reduce her cardiovascular risk. She should also work aggressively on weight loss. The patient is willing to continue with C Pap as long as we can make adjustments, but is to call if she wishes to consider a dental appliance.

## 2014-08-03 NOTE — Progress Notes (Signed)
   Subjective:    Patient ID: Rebecca Kramer, female    DOB: 1946-07-05, 69 y.o.   MRN: [id number]  HPI Patient comes in today for follow-up of her obstructive sleep apnea. She is having significant issues with her mask, especially when sleeping side to side. Her husband also has made adjustments to her sleep apnea, and she is unsure if she is even on the automatic setting. Obviously, because of this, she is having issues with her sleep and daytime alertness.   Review of Systems  Constitutional: Negative for fever and unexpected weight change.  HENT: Negative for congestion, dental problem, ear pain, nosebleeds, postnasal drip, rhinorrhea, sinus pressure, sneezing, sore throat and trouble swallowing.   Eyes: Negative for redness and itching.  Respiratory: Negative for cough, chest tightness, shortness of breath and wheezing.   Cardiovascular: Negative for palpitations and leg swelling.  Gastrointestinal: Negative for nausea and vomiting.  Genitourinary: Negative for dysuria.  Musculoskeletal: Negative for joint swelling.  Skin: Negative for rash.  Neurological: Negative for headaches.  Hematological: Does not bruise/bleed easily.  Psychiatric/Behavioral: Negative for dysphoric mood. The patient is not nervous/anxious.        Objective:   Physical Exam Overweight female in no acute distress Nose without purulence or discharge noted Neck without lymphadenopathy or thyromegaly Lower extremities with mild edema, no cyanosis Alert and oriented, moves all 4 extremities.       Assessment & Plan:

## 2014-09-24 ENCOUNTER — Other Ambulatory Visit: Payer: Self-pay

## 2014-09-25 ENCOUNTER — Encounter: Payer: Self-pay | Admitting: Family Medicine

## 2014-09-25 ENCOUNTER — Ambulatory Visit (INDEPENDENT_AMBULATORY_CARE_PROVIDER_SITE_OTHER): Payer: BLUE CROSS/BLUE SHIELD | Admitting: Family Medicine

## 2014-09-25 ENCOUNTER — Other Ambulatory Visit: Payer: Self-pay | Admitting: *Deleted

## 2014-09-25 ENCOUNTER — Telehealth: Payer: Self-pay | Admitting: Family Medicine

## 2014-09-25 VITALS — BP 100/70 | HR 105 | Temp 98.4°F | Ht 65.75 in | Wt 182.1 lb

## 2014-09-25 DIAGNOSIS — B9789 Other viral agents as the cause of diseases classified elsewhere: Principal | ICD-10-CM

## 2014-09-25 DIAGNOSIS — J069 Acute upper respiratory infection, unspecified: Secondary | ICD-10-CM | POA: Diagnosis not present

## 2014-09-25 MED ORDER — LEVOTHYROXINE SODIUM 100 MCG PO TABS: 100.0000 ug | ORAL_TABLET | Freq: Every day | ORAL | Status: DC

## 2014-09-25 MED ORDER — BENZONATATE 200 MG PO CAPS: 200.0000 mg | ORAL_CAPSULE | Freq: Three times a day (TID) | ORAL | Status: DC | PRN

## 2014-09-25 MED ORDER — HYDROCHLOROTHIAZIDE 25 MG PO TABS: 25.0000 mg | ORAL_TABLET | Freq: Every day | ORAL | Status: DC

## 2014-09-25 MED ORDER — ALBUTEROL SULFATE HFA 108 (90 BASE) MCG/ACT IN AERS: 2.0000 | INHALATION_SPRAY | RESPIRATORY_TRACT | Status: DC | PRN

## 2014-09-25 NOTE — Progress Notes (Signed)
Subjective:    Patient ID: Rebecca Kramer, female    DOB: [birth year], 69 y.o.   MRN: [id number]  HPI Here for uri symptoms   Started Thursday of last week  Cough - sometimes productive with yellow mucous  No wheezing  Also temperature 100.5 -that is better  Low energy-exhausted   Not a lot of head congestion  No facial pain   No pneumonia exposures  No flu exp that she knows of  Did travel a week ago - was around a lot of people   otc - generic tylenol for fever   Patient Active Problem List   Diagnosis Date Noted  . Obstructive sleep apnea 02/27/2010  . PURE HYPERCHOLESTEROLEMIA 02/05/2010  . ECZEMA 11/07/2009  . INFLAMED SEBORRHEIC KERATOSIS 10/29/2008  . OSTEOPENIA 10/08/2008  . Hypothyroidism 10/29/2006  . HYPERTENSION 10/29/2006  . REFLUX, ESOPHAGEAL 10/29/2006   Past Medical History  Diagnosis Date  . Hypertension   . Hypothyroidism   . Diverticulosis   . GERD (gastroesophageal reflux disease)     esophagitis with ulceration  . Osteopenia   . Bone spur     Left foot / posterior  . Menopausal symptoms     hot flashes  . Arthritis   . Sleep apnea 2007    AHI 45/hr in 2007  . Hyperthyroidism    Past Surgical History  Procedure Laterality Date  . Abdominal hysterectomy  1983    partial/endometriosis  . Breast surgery  1970's    breast lumpectomy/benign  . Breast surgery  1994    breast implants/saline  . Thumb nodule      removed  . Esophagogastroduodenoscopy  03/2008    esophagitis with ulcerations/?barretts  . Tonsillectomy    . Dorsal compartment release  05/14/2011    Procedure: RELEASE DORSAL COMPARTMENT (DEQUERVAIN);  Surgeon: Cammie Sickle., MD;  Location: [facility];  Service: Orthopedics;  Laterality: Right;  right release dorsal compartment/ de quervain release with debridement of mucoid cyst   History  Substance Use Topics  . Smoking status: Never Smoker   . Smokeless tobacco: Never Used  . Alcohol Use: No    Family History  Problem Relation Age of Onset  . Hypertension Mother   . Hyperlipidemia Mother   . Colon polyps Mother   . Heart disease Mother   . Migraines Mother   . Cancer Father     lung CA  . Migraines Brother   . Cancer Maternal Aunt     breast CA   Allergies  Allergen Reactions  . Dilaudid [Hydromorphone Hcl] Other (See Comments)    Face turned red; bp   Current Outpatient Prescriptions on File Prior to Visit  Medication Sig Dispense Refill  . fish oil-omega-3 fatty acids 1000 MG capsule Take 1 g by mouth as needed.     . hydrochlorothiazide (HYDRODIURIL) 25 MG tablet Take 1 tablet (25 mg total) by mouth daily. *follow-up appt. required before future refills are given* 90 tablet 0  . levothyroxine (SYNTHROID, LEVOTHROID) 100 MCG tablet Take 1 tablet (100 mcg total) by mouth daily before breakfast. 30 tablet 2  . metFORMIN (GLUCOPHAGE) 500 MG tablet Take 1,000 mg by mouth 2 (two) times daily with a meal.     . omeprazole (PRILOSEC) 10 MG capsule Take 1 capsule (10 mg total) by mouth daily. 30 capsule 11  . Probiotic Product (SOLUBLE FIBER/PROBIOTICS PO) Take by mouth as needed.     . Vitamin D, Ergocalciferol, (DRISDOL) 50000  UNITS CAPS capsule Take 50,000 Units by mouth every 7 (seven) days.      No current facility-administered medications on file prior to visit.      Review of Systems Review of Systems  Constitutional: Negative for , appetite change,  and unexpected weight change.  ENT pos for cong/rhinorrhea/st , neg for facial pain  Eyes: Negative for pain and visual disturbance.  Respiratory: Negative for shortness of breath.   Cardiovascular: Negative for cp or palpitations    Gastrointestinal: Negative for nausea, diarrhea and constipation.  Genitourinary: Negative for urgency and frequency.  Skin: Negative for pallor or rash   Neurological: Negative for weakness, light-headedness, numbness and headaches.  Hematological: Negative for adenopathy. Does not  bruise/bleed easily.  Psychiatric/Behavioral: Negative for dysphoric mood. The patient is not nervous/anxious.         Objective:   Physical Exam  Constitutional: She appears well-developed and well-nourished. No distress.  overwt and well appearing   HENT:  Head: Normocephalic and atraumatic.  Right Ear: External ear normal.  Left Ear: External ear normal.  Mouth/Throat: Oropharynx is clear and moist. No oropharyngeal exudate.  Nares are injected and congested  No sinus tenderness  Clear rhinorrhea  Throat clear   Eyes: Conjunctivae and EOM are normal. Pupils are equal, round, and reactive to light. Right eye exhibits no discharge. Left eye exhibits no discharge.  Neck: Normal range of motion. Neck supple.  Cardiovascular: Normal rate, regular rhythm and normal heart sounds.   Pulmonary/Chest: Effort normal and breath sounds normal. No respiratory distress. She has no rales. She exhibits no tenderness.  Scant wheeze on forced exp only  Lymphadenopathy:    She has no cervical adenopathy.  Neurological: She is alert.  Skin: Skin is warm and dry. No rash noted. No erythema. No pallor.  Psychiatric: She has a normal mood and affect.          Assessment & Plan:   Problem List Items Addressed This Visit      Respiratory   Viral URI with cough - Primary    Reassuring exam Disc symptomatic care - see instructions on AVS  Tessalon for cough  Albuterol mdi for wheezing prn  Update if not starting to improve in a week or if worsening

## 2014-09-25 NOTE — Progress Notes (Signed)
Pre visit review using our clinic review tool, if applicable. No additional management support is needed unless otherwise documented below in the visit note. 

## 2014-09-25 NOTE — Patient Instructions (Signed)
I think you have a viral upper respiratory infection  Drink fluids / get as much rest as you can  Try mucinex DM for cough and congestion  Tessalon (I sent to pharmacy) for cough as well  Tylenol for fever as needed  Use inhaler if you feel tight chest/wheezy    Update if not starting to improve in a week or if worsening

## 2014-09-25 NOTE — Telephone Encounter (Signed)
She was seen today

## 2014-09-25 NOTE — Telephone Encounter (Signed)
Patient Name: Rebecca Kramer DOB: [birth year] Initial Comment Caller states c/o cough, extreme fatigue Nurse Assessment Nurse: Vallery Sa, RN, Cathy Date/Time (Eastern Time): 09/25/2014 10:31:58 AM Confirm and document reason for call. If symptomatic, describe symptoms. ---Caller states she developed a productive cough about 4 days ago and fever 3 days ago (99.8 oral 2 days ago. No fever since yesterday). No wheezing or severe difficulty breathing. Has the patient traveled out of the country within the last 30 days? ---No Does the patient require triage? ---Yes Related visit to physician within the last 2 weeks? ---No Does the PT have any chronic conditions? (i.e. diabetes, asthma, etc.) ---Yes List chronic conditions. ---Diabetes, Acid Reflux, Sleep Apnea, High Blood Pressure Guidelines Guideline Title Affirmed Question Affirmed Notes Cough - Acute Productive [1] Fever > 100.5 F (38.1 C) AND [2] diabetes mellitus or weak immune system (e.g., HIV positive, cancer chemo, splenectomy, organ transplant, chronic steroids) Final Disposition User See Physician within 4 Hours (or PCP triage) Vallery Sa, RN, Cathy Comments Scheduled for 12:30pm appointment with Dr. Glori Bickers today.

## 2014-09-27 NOTE — Assessment & Plan Note (Signed)
Reassuring exam Disc symptomatic care - see instructions on AVS  Tessalon for cough  Albuterol mdi for wheezing prn  Update if not starting to improve in a week or if worsening

## 2014-10-12 ENCOUNTER — Ambulatory Visit (INDEPENDENT_AMBULATORY_CARE_PROVIDER_SITE_OTHER): Payer: BLUE CROSS/BLUE SHIELD | Admitting: Family Medicine

## 2014-10-12 ENCOUNTER — Encounter: Payer: Self-pay | Admitting: Family Medicine

## 2014-10-12 VITALS — BP 110/78 | HR 86 | Temp 98.4°F | Wt 188.4 lb

## 2014-10-12 DIAGNOSIS — H109 Unspecified conjunctivitis: Secondary | ICD-10-CM

## 2014-10-12 DIAGNOSIS — J209 Acute bronchitis, unspecified: Secondary | ICD-10-CM | POA: Insufficient documentation

## 2014-10-12 MED ORDER — AZITHROMYCIN 250 MG PO TABS: ORAL_TABLET | ORAL | Status: DC

## 2014-10-12 MED ORDER — TOBRAMYCIN-DEXAMETHASONE 0.3-0.1 % OP SUSP: 1.0000 [drp] | OPHTHALMIC | Status: DC

## 2014-10-12 NOTE — Patient Instructions (Signed)
Take the zithromax as directed  Rest and get fluids mucinex as needed  Use the eye drops in Left eye - and then only in right one if the symptoms begin in the right   Update if not starting to improve in a week or if worsening

## 2014-10-12 NOTE — Progress Notes (Signed)
Pre visit review using our clinic review tool, if applicable. No additional management support is needed unless otherwise documented below in the visit note. 

## 2014-10-12 NOTE — Progress Notes (Signed)
Subjective:    Patient ID: Rebecca Kramer, female    DOB: 08-16-1945, 69 y.o.   MRN: [id number]  HPI Here with continued uri symptoms from 3/22 (visit) One side of throat hurts   Still coughing  Productive -yellow to light brown  No sinus pain  Not a lot of nasal drainage   Wheeze is mild if any   No fever   Very very tired    She does have allergies - so cough is a bit worse outside   Patient Active Problem List   Diagnosis Date Noted  . Viral URI with cough 09/25/2014  . Obstructive sleep apnea 02/27/2010  . PURE HYPERCHOLESTEROLEMIA 02/05/2010  . ECZEMA 11/07/2009  . INFLAMED SEBORRHEIC KERATOSIS 10/29/2008  . OSTEOPENIA 10/08/2008  . Hypothyroidism 10/29/2006  . HYPERTENSION 10/29/2006  . REFLUX, ESOPHAGEAL 10/29/2006   Past Medical History  Diagnosis Date  . Hypertension   . Hypothyroidism   . Diverticulosis   . GERD (gastroesophageal reflux disease)     esophagitis with ulceration  . Osteopenia   . Bone spur     Left foot / posterior  . Menopausal symptoms     hot flashes  . Arthritis   . Sleep apnea 2007    AHI 45/hr in 2007  . Hyperthyroidism    Past Surgical History  Procedure Laterality Date  . Abdominal hysterectomy  1983    partial/endometriosis  . Breast surgery  1970's    breast lumpectomy/benign  . Breast surgery  1994    breast implants/saline  . Thumb nodule      removed  . Esophagogastroduodenoscopy  03/2008    esophagitis with ulcerations/?barretts  . Tonsillectomy    . Dorsal compartment release  05/14/2011    Procedure: RELEASE DORSAL COMPARTMENT (DEQUERVAIN);  Surgeon: Cammie Sickle., MD;  Location: [facility];  Service: Orthopedics;  Laterality: Right;  right release dorsal compartment/ de quervain release with debridement of mucoid cyst   History  Substance Use Topics  . Smoking status: Never Smoker   . Smokeless tobacco: Never Used  . Alcohol Use: No   Family History  Problem Relation Age of  Onset  . Hypertension Mother   . Hyperlipidemia Mother   . Colon polyps Mother   . Heart disease Mother   . Migraines Mother   . Cancer Father     lung CA  . Migraines Brother   . Cancer Maternal Aunt     breast CA   Allergies  Allergen Reactions  . Benzonatate     Hot flash  . Dilaudid [Hydromorphone Hcl] Other (See Comments)    Face turned red; bp   Current Outpatient Prescriptions on File Prior to Visit  Medication Sig Dispense Refill  . albuterol (PROVENTIL HFA;VENTOLIN HFA) 108 (90 BASE) MCG/ACT inhaler Inhale 2 puffs into the lungs every 4 (four) hours as needed for wheezing. 1 Inhaler 0  . fish oil-omega-3 fatty acids 1000 MG capsule Take 1 g by mouth as needed.     . hydrochlorothiazide (HYDRODIURIL) 25 MG tablet Take 1 tablet (25 mg total) by mouth daily. 90 tablet 3  . levothyroxine (SYNTHROID, LEVOTHROID) 100 MCG tablet Take 1 tablet (100 mcg total) by mouth daily before breakfast. 30 tablet 2  . metFORMIN (GLUCOPHAGE) 500 MG tablet Take 1,000 mg by mouth 2 (two) times daily with a meal.     . omeprazole (PRILOSEC) 10 MG capsule Take 1 capsule (10 mg total) by mouth daily. 30 capsule  11  . Probiotic Product (SOLUBLE FIBER/PROBIOTICS PO) Take by mouth as needed.     . Vitamin D, Ergocalciferol, (DRISDOL) 50000 UNITS CAPS capsule Take 50,000 Units by mouth every 7 (seven) days.      No current facility-administered medications on file prior to visit.        Of note- husband has bacterial conjuctivitis  She woke up with L eye - scant redness and d/c this am  Is itchy No vision change    Review of Systems Review of Systems  Constitutional: Negative for fever, appetite change, fatigue and unexpected weight change.  ENT pos for rhinorrhea/ neg for sinus pain  Eyes: Negative for pain and visual disturbance. pos for L eye redness and d/c Respiratory: Negative for wheeze and shortness of breath.   Cardiovascular: Negative for cp or palpitations    Gastrointestinal:  Negative for nausea, diarrhea and constipation.  Genitourinary: Negative for urgency and frequency.  Skin: Negative for pallor or rash   Neurological: Negative for weakness, light-headedness, numbness and headaches.  Hematological: Negative for adenopathy. Does not bruise/bleed easily.  Psychiatric/Behavioral: Negative for dysphoric mood. The patient is not nervous/anxious.         Objective:   Physical Exam  Constitutional: She appears well-developed and well-nourished. No distress.  obese and well appearing   HENT:  Head: Normocephalic and atraumatic.  Right Ear: External ear normal.  Left Ear: External ear normal.  Mouth/Throat: Oropharynx is clear and moist.  Nares are injected and congested  No sinus tenderness Clear rhinorrhea and post nasal drip   Eyes: EOM are normal. Pupils are equal, round, and reactive to light. Right eye exhibits no discharge. Left eye exhibits no discharge.  L eye mild conj injection with cloudy d/c  Neck: Normal range of motion. Neck supple.  Cardiovascular: Normal rate and normal heart sounds.   Pulmonary/Chest: Effort normal and breath sounds normal. No respiratory distress. She has no wheezes. She has no rales. She exhibits no tenderness.  Harsh bs with occ mild rhonchi No wheeze   Lymphadenopathy:    She has no cervical adenopathy.  Neurological: She is alert.  Skin: Skin is warm and dry. No rash noted.  Psychiatric: She has a normal mood and affect.          Assessment & Plan:   Problem List Items Addressed This Visit      Respiratory   Acute bronchitis    S/p uri  Will cover with zpak based on length of illness Also L conjunctivitis  Disc symptomatic care - see instructions on AVS  Update if not starting to improve in a week or if worsening          Other   Conjunctivitis - Primary    Exp to husband with bacterial conjunctivitis  Disc symptomatic care - see instructions on AVS  tobradex drops px - rev poss side eff Disc  hygiene/prev of transmission

## 2014-10-14 NOTE — Assessment & Plan Note (Signed)
S/p uri  Will cover with zpak based on length of illness Also L conjunctivitis  Disc symptomatic care - see instructions on AVS  Update if not starting to improve in a week or if worsening

## 2014-10-14 NOTE — Assessment & Plan Note (Signed)
Exp to husband with bacterial conjunctivitis  Disc symptomatic care - see instructions on AVS  tobradex drops px - rev poss side eff Disc hygiene/prev of transmission

## 2014-11-20 ENCOUNTER — Ambulatory Visit: Payer: BC Managed Care – PPO | Admitting: Internal Medicine

## 2014-11-21 ENCOUNTER — Ambulatory Visit: Payer: BLUE CROSS/BLUE SHIELD | Admitting: Internal Medicine

## 2014-11-23 ENCOUNTER — Encounter: Payer: BLUE CROSS/BLUE SHIELD | Admitting: Family Medicine

## 2014-11-25 ENCOUNTER — Telehealth: Payer: Self-pay | Admitting: Family Medicine

## 2014-11-25 DIAGNOSIS — E039 Hypothyroidism, unspecified: Secondary | ICD-10-CM

## 2014-11-25 DIAGNOSIS — E7800 Pure hypercholesterolemia, unspecified: Secondary | ICD-10-CM

## 2014-11-25 DIAGNOSIS — I10 Essential (primary) hypertension: Secondary | ICD-10-CM

## 2014-11-25 NOTE — Telephone Encounter (Signed)
-----   Message from Marchia Bond sent at 11/23/2014 10:38 AM EDT ----- Regarding: Cpx labs Mon 5/23, need orders please :-) Please order  future cpx labs for pt's upcoming lab appt. Thanks Aniceto Boss

## 2014-11-26 ENCOUNTER — Other Ambulatory Visit (INDEPENDENT_AMBULATORY_CARE_PROVIDER_SITE_OTHER): Payer: BLUE CROSS/BLUE SHIELD

## 2014-11-26 ENCOUNTER — Encounter: Payer: BLUE CROSS/BLUE SHIELD | Admitting: Family Medicine

## 2014-11-26 DIAGNOSIS — E039 Hypothyroidism, unspecified: Secondary | ICD-10-CM | POA: Diagnosis not present

## 2014-11-26 DIAGNOSIS — I10 Essential (primary) hypertension: Secondary | ICD-10-CM | POA: Diagnosis not present

## 2014-11-26 DIAGNOSIS — E7800 Pure hypercholesterolemia, unspecified: Secondary | ICD-10-CM

## 2014-11-26 LAB — CBC WITH DIFFERENTIAL/PLATELET
Basophils Absolute: 0.1 10*3/uL (ref 0.0–0.1)
Basophils Relative: 0.7 % (ref 0.0–3.0)
EOS ABS: 0.3 10*3/uL (ref 0.0–0.7)
Eosinophils Relative: 4.1 % (ref 0.0–5.0)
HEMATOCRIT: 41.4 % (ref 36.0–46.0)
Hemoglobin: 13.6 g/dL (ref 12.0–15.0)
LYMPHS ABS: 3.1 10*3/uL (ref 0.7–4.0)
Lymphocytes Relative: 41.9 % (ref 12.0–46.0)
MCHC: 33 g/dL (ref 30.0–36.0)
MCV: 84.3 fl (ref 78.0–100.0)
Monocytes Absolute: 0.6 10*3/uL (ref 0.1–1.0)
Monocytes Relative: 8 % (ref 3.0–12.0)
Neutro Abs: 3.3 10*3/uL (ref 1.4–7.7)
Neutrophils Relative %: 45.3 % (ref 43.0–77.0)
Platelets: 326 10*3/uL (ref 150.0–400.0)
RBC: 4.91 Mil/uL (ref 3.87–5.11)
RDW: 15.1 % (ref 11.5–15.5)
WBC: 7.3 10*3/uL (ref 4.0–10.5)

## 2014-11-26 LAB — LIPID PANEL
Cholesterol: 196 mg/dL (ref 0–200)
HDL: 47.9 mg/dL (ref >39.00)
LDL Cholesterol: 113 mg/dL — ABNORMAL HIGH (ref 0–99)
NonHDL: 148.1
Total CHOL/HDL Ratio: 4
Triglycerides: 174 mg/dL — ABNORMAL HIGH (ref 0.0–149.0)
VLDL: 34.8 mg/dL (ref 0.0–40.0)

## 2014-11-26 LAB — COMPREHENSIVE METABOLIC PANEL
ALT: 13 U/L (ref 0–35)
AST: 15 U/L (ref 0–37)
Albumin: 4 g/dL (ref 3.5–5.2)
Alkaline Phosphatase: 46 U/L (ref 39–117)
BUN: 13 mg/dL (ref 6–23)
CO2: 31 mEq/L (ref 19–32)
Calcium: 9.5 mg/dL (ref 8.4–10.5)
Chloride: 103 mEq/L (ref 96–112)
Creatinine, Ser: 0.82 mg/dL (ref 0.40–1.20)
GFR: 73.46 mL/min (ref >60.00)
GLUCOSE: 147 mg/dL — AB (ref 70–99)
Potassium: 4.4 mEq/L (ref 3.5–5.1)
Sodium: 139 mEq/L (ref 135–145)
TOTAL PROTEIN: 6.8 g/dL (ref 6.0–8.3)
Total Bilirubin: 0.2 mg/dL (ref 0.2–1.2)

## 2014-11-26 LAB — TSH: TSH: 1.12 u[IU]/mL (ref 0.35–4.50)

## 2014-11-30 ENCOUNTER — Encounter: Payer: Self-pay | Admitting: Family Medicine

## 2014-11-30 ENCOUNTER — Ambulatory Visit (INDEPENDENT_AMBULATORY_CARE_PROVIDER_SITE_OTHER): Payer: BLUE CROSS/BLUE SHIELD | Admitting: Family Medicine

## 2014-11-30 VITALS — BP 142/80 | HR 74 | Temp 98.5°F | Ht 66.0 in | Wt 190.5 lb

## 2014-11-30 DIAGNOSIS — Z23 Encounter for immunization: Secondary | ICD-10-CM

## 2014-11-30 DIAGNOSIS — I10 Essential (primary) hypertension: Secondary | ICD-10-CM | POA: Diagnosis not present

## 2014-11-30 DIAGNOSIS — E7800 Pure hypercholesterolemia, unspecified: Secondary | ICD-10-CM

## 2014-11-30 DIAGNOSIS — M8580 Other specified disorders of bone density and structure, unspecified site: Secondary | ICD-10-CM | POA: Diagnosis not present

## 2014-11-30 DIAGNOSIS — Z0000 Encounter for general adult medical examination without abnormal findings: Secondary | ICD-10-CM

## 2014-11-30 DIAGNOSIS — R739 Hyperglycemia, unspecified: Secondary | ICD-10-CM

## 2014-11-30 DIAGNOSIS — E039 Hypothyroidism, unspecified: Secondary | ICD-10-CM | POA: Diagnosis not present

## 2014-11-30 DIAGNOSIS — E119 Type 2 diabetes mellitus without complications: Secondary | ICD-10-CM | POA: Insufficient documentation

## 2014-11-30 NOTE — Patient Instructions (Addendum)
Take your diuretic the next time you come so BP reading is more accurate  prevnar vaccine today  Follow up with me in 3 months with labs prior for hyperglycemia  Watch sugar and carbs in diet   Keep exercising

## 2014-11-30 NOTE — Progress Notes (Signed)
Pre visit review using our clinic review tool, if applicable. No additional management support is needed unless otherwise documented below in the visit note. 

## 2014-11-30 NOTE — Progress Notes (Signed)
Subjective:    Patient ID: Rebecca Kramer, female    DOB: Nov 30, 1945, 69 y.o.   MRN: [id number]  HPI Here for health maintenance exam and to review chronic medical problems    Doing well overall    Wt is up 2 lb with bmi of 30 In obese range [facility]-4 [facility] / very good about that  Does not always eat healthy- but not too bad (occ sweets)    Hep C screen- low risk/ declines   PNA vaccine 1/13 , due for prevnar -will get today   Pap nl 4/14 neg  No gyn problems  Has had a rectocele for a while    Flu shot 10/15  Mammogram 12/15 nl  Self exam -no lumps   Td 12/07  Colonoscopy  9/09, no fam hx of cancer  Normal  Zoster vaccine 5/11  Fall screen-no falls   Dep screen  Glucose was   147 Was fasting  Is on metformin - started by Dr Sharol Roussel    Hyperlipidemia Diet controlled Lab Results  Component Value Date   CHOL 196 11/26/2014   CHOL 152 10/07/2012   CHOL 209* 07/17/2011   Lab Results  Component Value Date   HDL 47.90 11/26/2014   HDL 37.30* 10/07/2012   HDL 51.00 07/17/2011   Lab Results  Component Value Date   LDLCALC 113* 11/26/2014   LDLCALC 98 10/07/2012   LDLCALC 130* 02/05/2010   Lab Results  Component Value Date   TRIG 174.0* 11/26/2014   TRIG 86.0 10/07/2012   TRIG 138.0 07/17/2011   Lab Results  Component Value Date   CHOLHDL 4 11/26/2014   CHOLHDL 4 10/07/2012   CHOLHDL 4 07/17/2011   Lab Results  Component Value Date   LDLDIRECT 129.6 07/17/2011   LDLDIRECT 149.8 11/06/2009   This is stable  HDL is up and LDL is up a little     Chemistry      Component Value Date/Time   NA 139 11/26/2014 0832   K 4.4 11/26/2014 0832   CL 103 11/26/2014 0832   CO2 31 11/26/2014 0832   BUN 13 11/26/2014 0832   CREATININE 0.82 11/26/2014 0832      Component Value Date/Time   CALCIUM 9.5 11/26/2014 0832   ALKPHOS 46 11/26/2014 0832   AST 15 11/26/2014 0832   ALT 13 11/26/2014 0832   BILITOT 0.2 11/26/2014 0832      Lab Results  Component Value Date   WBC 7.3 11/26/2014   HGB 13.6 11/26/2014   HCT 41.4 11/26/2014   MCV 84.3 11/26/2014   PLT 326.0 11/26/2014     bp is up a bit on first check - but pt did not take her hctz today /that is why  No cp or palpitations or headaches or edema  No side effects to medicines  BP Readings from Last 3 Encounters:  11/30/14 142/80  10/12/14 110/78  09/25/14 100/70      Sees endo for thyroid No symptoms  Lab Results  Component Value Date   TSH 1.12 11/26/2014     Patient Active Problem List   Diagnosis Date Noted  . Routine general medical examination at a health care facility 11/30/2014  . Hyperglycemia 11/30/2014  . Conjunctivitis 10/12/2014  . Acute bronchitis 10/12/2014  . Viral URI with cough 09/25/2014  . Obstructive sleep apnea 02/27/2010  . PURE HYPERCHOLESTEROLEMIA 02/05/2010  . ECZEMA 11/07/2009  . INFLAMED SEBORRHEIC  KERATOSIS 10/29/2008  . Osteopenia 10/08/2008  . Hypothyroidism 10/29/2006  . Essential hypertension 10/29/2006  . REFLUX, ESOPHAGEAL 10/29/2006   Past Medical History  Diagnosis Date  . Hypertension   . Hypothyroidism   . Diverticulosis   . GERD (gastroesophageal reflux disease)     esophagitis with ulceration  . Osteopenia   . Bone spur     Left foot / posterior  . Menopausal symptoms     hot flashes  . Arthritis   . Sleep apnea 2007    AHI 45/hr in 2007  . Hyperthyroidism    Past Surgical History  Procedure Laterality Date  . Abdominal hysterectomy  1983    partial/endometriosis  . Breast surgery  1970's    breast lumpectomy/benign  . Breast surgery  1994    breast implants/saline  . Thumb nodule      removed  . Esophagogastroduodenoscopy  03/2008    esophagitis with ulcerations/?barretts  . Tonsillectomy    . Dorsal compartment release  05/14/2011    Procedure: RELEASE DORSAL COMPARTMENT (DEQUERVAIN);  Surgeon: Cammie Sickle., MD;  Location: [facility];  Service: Orthopedics;  Laterality: Right;  right release dorsal compartment/ de quervain release with debridement of mucoid cyst   History  Substance Use Topics  . Smoking status: Never Smoker   . Smokeless tobacco: Never Used  . Alcohol Use: No   Family History  Problem Relation Age of Onset  . Hypertension Mother   . Hyperlipidemia Mother   . Colon polyps Mother   . Heart disease Mother   . Migraines Mother   . Cancer Father     lung CA  . Migraines Brother   . Cancer Maternal Aunt     breast CA   Allergies  Allergen Reactions  . Benzonatate     Hot flash  . Dilaudid [Hydromorphone Hcl] Other (See Comments)    Face turned red; bp   Current Outpatient Prescriptions on File Prior to Visit  Medication Sig Dispense Refill  . fish oil-omega-3 fatty acids 1000 MG capsule Take 1 g by mouth as needed.     . hydrochlorothiazide (HYDRODIURIL) 25 MG tablet Take 1 tablet (25 mg total) by mouth daily. 90 tablet 3  . levothyroxine (SYNTHROID, LEVOTHROID) 100 MCG tablet Take 1 tablet (100 mcg total) by mouth daily before breakfast. 30 tablet 2  . metFORMIN (GLUCOPHAGE) 500 MG tablet Take 1,000 mg by mouth 2 (two) times daily with a meal.     . omeprazole (PRILOSEC) 10 MG capsule Take 1 capsule (10 mg total) by mouth daily. 30 capsule 11  . Probiotic Product (SOLUBLE FIBER/PROBIOTICS PO) Take by mouth as needed.      No current facility-administered medications on file prior to visit.    Review of Systems Review of Systems  Constitutional: Negative for fever, appetite change, fatigue and unexpected weight change.  Eyes: Negative for pain and visual disturbance.  Respiratory: Negative for cough and shortness of breath.   Cardiovascular: Negative for cp or palpitations    Gastrointestinal: Negative for nausea, diarrhea and constipation.  Genitourinary: Negative for urgency and frequency.  Skin: Negative for pallor or rash   Neurological: Negative for weakness,  light-headedness, numbness and headaches.  Hematological: Negative for adenopathy. Does not bruise/bleed easily.  Psychiatric/Behavioral: Negative for dysphoric mood. The patient is not nervous/anxious.         Objective:   Physical Exam  Constitutional: She appears well-developed and well-nourished. No distress.  obese and  well appearing   HENT:  Head: Normocephalic and atraumatic.  Right Ear: External ear normal.  Left Ear: External ear normal.  Mouth/Throat: Oropharynx is clear and moist.  Eyes: Conjunctivae and EOM are normal. Pupils are equal, round, and reactive to light. No scleral icterus.  Neck: Normal range of motion. Neck supple. No JVD present. Carotid bruit is not present. No thyromegaly present.  Cardiovascular: Normal rate, regular rhythm, normal heart sounds and intact distal pulses.  Exam reveals no gallop.   Pulmonary/Chest: Effort normal and breath sounds normal. No respiratory distress. She has no wheezes. She exhibits no tenderness.  Abdominal: Soft. Bowel sounds are normal. She exhibits no distension, no abdominal bruit and no mass. There is no tenderness.  Genitourinary: No breast swelling, tenderness, discharge or bleeding.  Breast exam: No mass, nodules, thickening, tenderness, bulging, retraction, inflamation, nipple discharge or skin changes noted.  No axillary or clavicular LA.      Musculoskeletal: Normal range of motion. She exhibits no edema or tenderness.  Lymphadenopathy:    She has no cervical adenopathy.  Neurological: She is alert. She has normal reflexes. No cranial nerve deficit. She exhibits normal muscle tone. Coordination normal.  Skin: Skin is warm and dry. No rash noted. No erythema. No pallor.  Psychiatric: She has a normal mood and affect.          Assessment & Plan:   Problem List Items Addressed This Visit    Essential hypertension - Primary    bp is elevated today since she did not take her medication  Will plan f/u to re check  this  Rev lifestyle habits /DASH diet  Labs rev       Hyperglycemia    Elevated fasting glucose  Pt takes metformin from prev PCP-unsure what her dx was  Rev low glycemic diet and exercise to prevent DM  F/u planned with A1C      Hypothyroidism    Hypothyroidism  Pt has no clinical changes No change in energy level/ hair or skin/ edema and no tremor Lab Results  Component Value Date   TSH 1.12 11/26/2014          Osteopenia    Stable dexa in 2014 with no falls or fractures or other risk factors  Disc need for calcium/ vitamin D/ wt bearing exercise and bone density test every 2 y to monitor Disc safety/ fracture risk in detail        PURE HYPERCHOLESTEROLEMIA    Disc goals for lipids and reasons to control them Rev labs with pt Rev low sat fat diet in detail  Stable ratio/ diet controlled        Routine general medical examination at a health care facility    Reviewed health habits including diet and exercise and skin cancer prevention Reviewed appropriate screening tests for age  Also reviewed health mt list, fam hx and immunization status , as well as social and family history   See HPI prevnar vaccine today  Enc to be compliant with meds  Labs reviewed        Other Visit Diagnoses    Need for vaccination with 13-polyvalent pneumococcal conjugate vaccine        Relevant Orders    Pneumococcal conjugate vaccine 13-valent (Completed)

## 2014-12-03 NOTE — Assessment & Plan Note (Signed)
Reviewed health habits including diet and exercise and skin cancer prevention Reviewed appropriate screening tests for age  Also reviewed health mt list, fam hx and immunization status , as well as social and family history   See HPI prevnar vaccine today  Enc to be compliant with meds  Labs reviewed

## 2014-12-03 NOTE — Assessment & Plan Note (Signed)
bp is elevated today since she did not take her medication  Will plan f/u to re check this  Rev lifestyle habits /DASH diet  Labs rev

## 2014-12-03 NOTE — Assessment & Plan Note (Signed)
Disc goals for lipids and reasons to control them Rev labs with pt Rev low sat fat diet in detail  Stable ratio/ diet controlled

## 2014-12-03 NOTE — Assessment & Plan Note (Signed)
Stable dexa in 2014 with no falls or fractures or other risk factors  Disc need for calcium/ vitamin D/ wt bearing exercise and bone density test every 2 y to monitor Disc safety/ fracture risk in detail

## 2014-12-03 NOTE — Assessment & Plan Note (Signed)
Elevated fasting glucose  Pt takes metformin from prev PCP-unsure what her dx was  Rev low glycemic diet and exercise to prevent DM  F/u planned with A1C

## 2014-12-03 NOTE — Assessment & Plan Note (Signed)
Hypothyroidism  Pt has no clinical changes No change in energy level/ hair or skin/ edema and no tremor Lab Results  Component Value Date   TSH 1.12 11/26/2014

## 2014-12-04 ENCOUNTER — Telehealth: Payer: Self-pay | Admitting: *Deleted

## 2014-12-04 ENCOUNTER — Telehealth: Payer: Self-pay | Admitting: Family Medicine

## 2014-12-04 DIAGNOSIS — H60543 Acute eczematoid otitis externa, bilateral: Secondary | ICD-10-CM | POA: Insufficient documentation

## 2014-12-04 MED ORDER — FLUTICASONE PROPIONATE 0.05 % EX LOTN: TOPICAL_LOTION | CUTANEOUS | Status: DC

## 2014-12-04 NOTE — Telephone Encounter (Signed)
Okay to refill the dose that she is already taking, since the labs are normal. I would like to see her back in a year from the previous appointment, which would be in about 6 months from now, since things are going well.

## 2014-12-04 NOTE — Telephone Encounter (Signed)
Pt notified of Dr. Tower's comments  

## 2014-12-04 NOTE — Telephone Encounter (Signed)
Rx refill for Levothyroxine 161mcg. Pt had TSH labs done with Dr Glori Bickers on 5/23. Please advise.

## 2014-12-04 NOTE — Telephone Encounter (Signed)
Needs px for fluticasone soln for ear eczema =sent to [facility] Please let pt know I could not find the solution-so I sent lotion- she can use it with a Q tip

## 2014-12-05 MED ORDER — LEVOTHYROXINE SODIUM 100 MCG PO TABS: 100.0000 ug | ORAL_TABLET | Freq: Every day | ORAL | Status: DC

## 2014-12-26 ENCOUNTER — Other Ambulatory Visit: Payer: Self-pay | Admitting: Family Medicine

## 2014-12-26 NOTE — Telephone Encounter (Signed)
No- I don't think so , unless she tells you otherwise- please check in with her about that, thanks

## 2014-12-26 NOTE — Telephone Encounter (Signed)
[facility] left v/m requesting status of Vit D 50,000 unit refill.Please advise.

## 2014-12-26 NOTE — Telephone Encounter (Signed)
At appt on 11/30/14 this med was taken off med list saying that she had completed the course, do you want her to take it again, please advise

## 2014-12-27 NOTE — Telephone Encounter (Signed)
Please order a vit D level for vit D def sometime this month-let's see how level is and decide from there

## 2014-12-27 NOTE — Telephone Encounter (Signed)
Pt said Dr. Sharol Roussel prescribed Rx back in Sept and then you took over Rx. Pt said she just took her last pill this week and was still taking it back in May. Pt not sure if she is suppose to still be taking this or not so she said it's what ever Dr. Glori Bickers thinks she should do

## 2014-12-28 NOTE — Telephone Encounter (Signed)
Left voicemail letting pt know to call and schedule a lab appt to check vitamin D before Dr. Glori Bickers decides if she still needs to be on Rx

## 2015-01-01 ENCOUNTER — Other Ambulatory Visit: Payer: Self-pay | Admitting: Family Medicine

## 2015-01-01 ENCOUNTER — Telehealth: Payer: Self-pay | Admitting: Family Medicine

## 2015-01-01 DIAGNOSIS — E559 Vitamin D deficiency, unspecified: Secondary | ICD-10-CM

## 2015-01-01 NOTE — Telephone Encounter (Signed)
Pt wants to know if she needs labs done for vitamin d before getting a vit d refill. Please call cell number, thanks.

## 2015-01-01 NOTE — Telephone Encounter (Signed)
appt scheduled to check vitamin D level before we refill med

## 2015-01-02 ENCOUNTER — Telehealth: Payer: Self-pay | Admitting: Internal Medicine

## 2015-01-02 ENCOUNTER — Other Ambulatory Visit (INDEPENDENT_AMBULATORY_CARE_PROVIDER_SITE_OTHER): Payer: BLUE CROSS/BLUE SHIELD

## 2015-01-02 DIAGNOSIS — E559 Vitamin D deficiency, unspecified: Secondary | ICD-10-CM

## 2015-01-02 LAB — VITAMIN D 25 HYDROXY (VIT D DEFICIENCY, FRACTURES): VITD: 51.9 ng/mL (ref 30.00–100.00)

## 2015-01-02 NOTE — Telephone Encounter (Signed)
Returned pt's call, lvm advising her that Dr Cruzita Lederer does not typically do labs prior to appts due to if there are any other issues that need to be addressed, she can order those labs all at one time.

## 2015-01-02 NOTE — Telephone Encounter (Signed)
Pt calling to see if she needs to come for labs prior to her appt

## 2015-01-03 ENCOUNTER — Other Ambulatory Visit: Payer: Self-pay | Admitting: *Deleted

## 2015-01-03 ENCOUNTER — Telehealth: Payer: Self-pay | Admitting: Internal Medicine

## 2015-01-03 DIAGNOSIS — E039 Hypothyroidism, unspecified: Secondary | ICD-10-CM

## 2015-01-03 NOTE — Telephone Encounter (Signed)
Called Rebecca Kramer back, again. Left another voice message advising her that Dr Cruzita Lederer does not usually do labs prior to appts due to there may be other issues to be addressed during the appt and labs will be done afterwards. Advised Rebecca Kramer to call with any other questions.

## 2015-01-03 NOTE — Telephone Encounter (Signed)
Returned pt's call. Pt stated that she only sees Dr Rebecca Kramer for her thyroid and would like to go ahead and have labs done prior to the appt so that she can discuss the levels with her. Advised pt I would order the labs and she could have them done at the [facility] office. Pt voiced understanding.

## 2015-01-03 NOTE — Telephone Encounter (Signed)
Pt called returning [place] phone call, Larene Beach can you call her back please

## 2015-01-03 NOTE — Telephone Encounter (Signed)
Please call regarding lab results and making an appt

## 2015-01-03 NOTE — Telephone Encounter (Signed)
Pt called returning Echo phone call, please call pt back

## 2015-01-10 ENCOUNTER — Other Ambulatory Visit (INDEPENDENT_AMBULATORY_CARE_PROVIDER_SITE_OTHER): Payer: BLUE CROSS/BLUE SHIELD

## 2015-01-10 DIAGNOSIS — E039 Hypothyroidism, unspecified: Secondary | ICD-10-CM | POA: Diagnosis not present

## 2015-01-10 LAB — T4, FREE: FREE T4: 1.33 ng/dL (ref 0.60–1.60)

## 2015-01-10 LAB — TSH: TSH: 0.43 u[IU]/mL (ref 0.35–4.50)

## 2015-01-10 LAB — T3, FREE: T3 FREE: 3.8 pg/mL (ref 2.3–4.2)

## 2015-01-14 ENCOUNTER — Encounter: Payer: Self-pay | Admitting: Internal Medicine

## 2015-01-14 ENCOUNTER — Ambulatory Visit (INDEPENDENT_AMBULATORY_CARE_PROVIDER_SITE_OTHER): Payer: BLUE CROSS/BLUE SHIELD | Admitting: Internal Medicine

## 2015-01-14 VITALS — BP 114/68 | HR 103 | Temp 98.3°F | Resp 12 | Wt 191.4 lb

## 2015-01-14 DIAGNOSIS — E063 Autoimmune thyroiditis: Secondary | ICD-10-CM

## 2015-01-14 DIAGNOSIS — E038 Other specified hypothyroidism: Secondary | ICD-10-CM

## 2015-01-14 NOTE — Progress Notes (Signed)
Patient ID: Rebecca Kramer, female   DOB: Jan 16, 1946, 69 y.o.   MRN: [id number]    HPI  Rebecca Kramer is a 69 y.o.-year-old female, initially referred by her PCP, Dr.Tower, returning for f/u for  Hashimoto hypothyroidism. She was seeing Dr Sharol Rous[facility]) >> stopped seeing her 2/2 cost. Last visit 8 mo ago.  Pt. has been dx with hypothyroidism "many years ago"; was on: - generic LT4  - Synthroid DAW - Naturethroid 81.5 mg + Cytomel 5 mg bid (equivalent to ~180 mcg LT4) >> stopped 6 mo ago She is now on Levothyroxine 100 mcg daily (increased from 88 mcg in 03/2014)  She takes the thyroid hh: - fasting - with water - separated by >30 min from b'fast  - + started calcium, vitamin D (switched from Ergocalciferol), multivitamins - later in the day - was taking PPIs in am! >> now moved PPIs later - 10 mg Omeprazole qod  I reviewed pt's thyroid tests: Lab Results  Component Value Date   TSH 0.43 01/10/2015   TSH 1.12 11/26/2014   TSH 0.79 07/23/2014   TSH 1.04 05/08/2014   TSH 0.12* 10/07/2012   TSH 3.56 07/17/2011   TSH 5.07 11/06/2009   TSH 0.12* 10/08/2008   TSH 1.51 08/05/2007   FREET4 1.33 01/10/2015   FREET4 1.23 07/23/2014   FREET4 1.02 05/08/2014   FREET4 1.4 10/08/2008   03/14/2014: ATA 313 (<2) TPO Abs 39 (<9) TSH 3.552 (0.35-4.5), free T4 0.82 (0.8-1.8), Free T3 2.6 (2.3-4.2), reverse T3 12 (8-25) At the same time, she hadn't hemoglobin A1c 6.3, fasting insulin 16.6, vitamin D 66  Pt describes: - + weight gain (despite 1200 cal diet, prev. On Weight Watchers, Paleo diet; shakes; fruit + veggies + lean meats) - no fatigue  - no cold intolerance, no hot flashes - no depression - no constipation - no dry skin - + hair loss  Pt denies feeling nodules in neck, hoarseness, no dysphagia/no odynophagia, SOB with lying down.  Exercises at [employer] 3 times a week.  ROS: Constitutional: + see HPI Eyes: no blurry vision, no  xerophthalmia ENT: no sore throat, no nodules palpated in throat, no dysphagia/odynophagia, no hoarseness Cardiovascular: no CP/SOB/palpitations/leg swelling Respiratory: no cough/no SOB Gastrointestinal: no N/V/D/C, + acid reflux - improved Musculoskeletal: no muscle/joint aches Skin: no rashes, + mild hair loss Neurological: no tremors/numbness/tingling/dizziness  I reviewed pt's medications, allergies, PMH, social hx, family hx, and changes were documented in the history of present illness. Otherwise, unchanged from my initial visit note.  Past Medical History  Diagnosis Date  . Hypertension   . Hypothyroidism   . Diverticulosis   . GERD (gastroesophageal reflux disease)     esophagitis with ulceration  . Osteopenia   . Bone spur     Left foot / posterior  . Menopausal symptoms     hot flashes  . Arthritis   . Sleep apnea 2007    AHI 45/hr in 2007  . Hyperthyroidism    Past Surgical History  Procedure Laterality Date  . Abdominal hysterectomy  1983    partial/endometriosis  . Breast surgery  1970's    breast lumpectomy/benign  . Breast surgery  1994    breast implants/saline  . Thumb nodule      removed  . Esophagogastroduodenoscopy  03/2008    esophagitis with ulcerations/?barretts  . Tonsillectomy    . Dorsal compartment release  05/14/2011    Procedure: RELEASE DORSAL COMPARTMENT (DEQUERVAIN);  Surgeon: Youlanda Mighty  Luisa Dago., MD;  Location: [facility];  Service: Orthopedics;  Laterality: Right;  right release dorsal compartment/ de quervain release with debridement of mucoid cyst   History   Social History  . Marital Status: Married    Spouse Name: N/A    Number of Children: 2   Occupational History  . retired   Social History Main Topics  . Smoking status: Never Smoker   . Smokeless tobacco: Never Used  . Alcohol Use: No  . Drug Use: No   Current Outpatient Prescriptions on File Prior to Visit  Medication Sig Dispense Refill  . fish  oil-omega-3 fatty acids 1000 MG capsule Take 1 g by mouth as needed.     . Fluticasone Propionate 0.05 % LOTN Apply small amount to ear canal daily as needed 60 mL 3  . hydrochlorothiazide (HYDRODIURIL) 25 MG tablet Take 1 tablet (25 mg total) by mouth daily. 90 tablet 3  . levothyroxine (SYNTHROID, LEVOTHROID) 100 MCG tablet Take 1 tablet (100 mcg total) by mouth daily before breakfast. 30 tablet 4  . metFORMIN (GLUCOPHAGE) 500 MG tablet Take 1,000 mg by mouth 2 (two) times daily with a meal.     . omeprazole (PRILOSEC) 10 MG capsule Take 1 capsule (10 mg total) by mouth daily. 30 capsule 11  . Probiotic Product (SOLUBLE FIBER/PROBIOTICS PO) Take by mouth as needed.      No current facility-administered medications on file prior to visit.   Allergies  Allergen Reactions  . Benzonatate     Hot flash  . Dilaudid [Hydromorphone Hcl] Other (See Comments)    Face turned red; bp   Family History  Problem Relation Age of Onset  . Hypertension Mother   . Hyperlipidemia Mother   . Colon polyps Mother   . Heart disease Mother   . Migraines Mother   . Cancer Father     lung CA  . Migraines Brother   . Cancer Maternal Aunt     breast CA   PE: BP 114/68 mmHg  Pulse 103  Temp(Src) 98.3 F (36.8 C) (Oral)  Resp 12  Wt 191 lb 6.4 oz (86.818 kg)  SpO2 96% Wt Readings from Last 3 Encounters:  01/14/15 191 lb 6.4 oz (86.818 kg)  11/30/14 190 lb 8 oz (86.41 kg)  10/12/14 188 lb 6.4 oz (85.458 kg)   Constitutional: overweight, in NAD Eyes: PERRLA, EOMI, no exophthalmos ENT: moist mucous membranes, no thyromegaly, no cervical lymphadenopathy Cardiovascular: RRR, No MRG Respiratory: CTA B Gastrointestinal: abdomen soft, NT, ND, BS+ Musculoskeletal: no deformities, strength intact in all 4 Skin: moist, warm, no rashes Neurological: no tremor with outstretched hands, DTR normal in all 4  ASSESSMENT: 1. Hypothyroidism - Hashimoto's thyroiditis  2. Weight gain  PLAN:  1. Patient  with long-standing hypothyroidism, on levothyroxine therapy. She appears euthyroid. She does not appear to have a goiter, thyroid nodules, or neck compression symptoms. She wanted to have TFTs drawn before the appt >> they returned normal: Component     Latest Ref Rng 01/10/2015  TSH     0.35 - 4.50 uIU/mL 0.43  T3, Free     2.3 - 4.2 pg/mL 3.8  Free T4     0.60 - 1.60 ng/dL 1.33  - she is now on generic LT4 but was on Naturethroid + Cytomel in the past by her integrative therapist. She did not feel much different on that regimen. She agrees to continue the current LT4 formulation - 100 mcg daily. -  We discussed about correct intake of levothyroxine, fasting, with water, separated by at least 30 minutes from breakfast, and separated by more than 4 hours from calcium, iron, multivitamins, acid reflux medications (PPIs). She was taking the PPIs in am >> now moved her Omeprazole at lunch or later. She also takes Ca and MVI at lunchtime. - will check thyroid tests at next visit, in 1 year Return in about 1 year (around 01/14/2016).  2. Weight gain - I recommended the [facility] Weight loss [facility] and given reference

## 2015-01-14 NOTE — Patient Instructions (Addendum)
Please come back for a follow-up appointment in 1 year.  Try the [facility] loss [facility]. See the brochure.

## 2015-02-04 LAB — HM DIABETES EYE EXAM

## 2015-02-20 ENCOUNTER — Other Ambulatory Visit (INDEPENDENT_AMBULATORY_CARE_PROVIDER_SITE_OTHER): Payer: BLUE CROSS/BLUE SHIELD

## 2015-02-20 ENCOUNTER — Encounter: Payer: Self-pay | Admitting: *Deleted

## 2015-02-20 DIAGNOSIS — R739 Hyperglycemia, unspecified: Secondary | ICD-10-CM

## 2015-02-20 LAB — HEMOGLOBIN A1C: HEMOGLOBIN A1C: 6.8 % — AB (ref 4.6–6.5)

## 2015-02-21 ENCOUNTER — Other Ambulatory Visit: Payer: Self-pay | Admitting: *Deleted

## 2015-02-21 MED ORDER — LEVOTHYROXINE SODIUM 100 MCG PO TABS: 100.0000 ug | ORAL_TABLET | Freq: Every day | ORAL | Status: DC

## 2015-03-06 ENCOUNTER — Encounter: Payer: Self-pay | Admitting: Family Medicine

## 2015-03-06 ENCOUNTER — Ambulatory Visit (INDEPENDENT_AMBULATORY_CARE_PROVIDER_SITE_OTHER): Payer: BLUE CROSS/BLUE SHIELD | Admitting: Family Medicine

## 2015-03-06 VITALS — BP 122/86 | HR 73 | Temp 98.4°F | Ht 66.0 in | Wt 191.5 lb

## 2015-03-06 DIAGNOSIS — E119 Type 2 diabetes mellitus without complications: Secondary | ICD-10-CM | POA: Diagnosis not present

## 2015-03-06 DIAGNOSIS — I10 Essential (primary) hypertension: Secondary | ICD-10-CM

## 2015-03-06 MED ORDER — METFORMIN HCL 500 MG PO TABS: 500.0000 mg | ORAL_TABLET | Freq: Two times a day (BID) | ORAL | Status: DC

## 2015-03-06 MED ORDER — LISINOPRIL 10 MG PO TABS: 10.0000 mg | ORAL_TABLET | Freq: Every day | ORAL | Status: DC

## 2015-03-06 NOTE — Patient Instructions (Signed)
Stop at check out for ref for diabetic teaching  Stop hctz Start lisinopril for kidney protection and blood pressure-if any side effects like cough please let me know  Continue metformin 500 mg twice daily (breakfast and dinner)  Follow up with me in about 3 months with labs prior

## 2015-03-06 NOTE — Progress Notes (Signed)
Pre visit review using our clinic review tool, if applicable. No additional management support is needed unless otherwise documented below in the visit note. 

## 2015-03-06 NOTE — Progress Notes (Signed)
Subjective:    Patient ID: Rebecca Kramer, female    DOB: 1946-03-03, 69 y.o.   MRN: [id number]  HPI Here for f/u of DM2  Doing pretty well overall   Does not check her blood glucose readings    Pt was started on metformin in the past by Dr Sharol Roussel - suspect hyperglycemia Was supposed to be on 1000 mg bid but stayed with 500  Lab Results  Component Value Date   HGBA1C 6.8* 02/20/2015   This is in the DM range  Never talked to anyone about that   No family hx of DM   Wt is stable with bmi of 30 She just started going to [facility] weight [facility] center -recommended by endocrinology  Had info class and then yesterday had first visit (fitness tests etc) - saw a personal trainer  Next week sees a doctor there - they will decide on a program to choose from  Medications are not used , will have visits with a nutritionist and a behavioral therapist  Has tried 1200 calories per day and exercise 3-4 times per week (for 1 hour)  Has not had success   Sees Dr Cruzita Lederer for thyroid problems   bp is stable today  No cp or palpitations or headaches or edema  No side effects to medicines  BP Readings from Last 3 Encounters:  03/06/15 122/86  01/14/15 114/68  11/30/14 142/80     She does take hctz but no statins  Lab Results  Component Value Date   CHOL 196 11/26/2014   HDL 47.90 11/26/2014   LDLCALC 113* 11/26/2014   LDLDIRECT 129.6 07/17/2011   TRIG 174.0* 11/26/2014   CHOLHDL 4 11/26/2014     Patient Active Problem List   Diagnosis Date Noted  . Acute eczematoid otitis externa of both ears 12/04/2014  . Routine general medical examination at a health care facility 11/30/2014  . Diabetes mellitus type 2, controlled, without complications 27/74/1287  . Conjunctivitis 10/12/2014  . Acute bronchitis 10/12/2014  . Viral URI with cough 09/25/2014  . Obstructive sleep apnea 02/27/2010  . PURE HYPERCHOLESTEROLEMIA 02/05/2010  . ECZEMA 11/07/2009  . INFLAMED SEBORRHEIC  KERATOSIS 10/29/2008  . Osteopenia 10/08/2008  . Hypothyroidism due to Hashimoto's thyroiditis 10/29/2006  . Essential hypertension 10/29/2006  . REFLUX, ESOPHAGEAL 10/29/2006   Past Medical History  Diagnosis Date  . Hypertension   . Hypothyroidism   . Diverticulosis   . GERD (gastroesophageal reflux disease)     esophagitis with ulceration  . Osteopenia   . Bone spur     Left foot / posterior  . Menopausal symptoms     hot flashes  . Arthritis   . Sleep apnea 2007    AHI 45/hr in 2007  . Hyperthyroidism    Past Surgical History  Procedure Laterality Date  . Abdominal hysterectomy  1983    partial/endometriosis  . Breast surgery  1970's    breast lumpectomy/benign  . Breast surgery  1994    breast implants/saline  . Thumb nodule      removed  . Esophagogastroduodenoscopy  03/2008    esophagitis with ulcerations/?barretts  . Tonsillectomy    . Dorsal compartment release  05/14/2011    Procedure: RELEASE DORSAL COMPARTMENT (DEQUERVAIN);  Surgeon: Cammie Sickle., MD;  Location: [facility];  Service: Orthopedics;  Laterality: Right;  right release dorsal compartment/ de quervain release with debridement of mucoid cyst   Social History  Substance Use Topics  .  Smoking status: Never Smoker   . Smokeless tobacco: Never Used  . Alcohol Use: No   Family History  Problem Relation Age of Onset  . Hypertension Mother   . Hyperlipidemia Mother   . Colon polyps Mother   . Heart disease Mother   . Migraines Mother   . Cancer Father     lung CA  . Migraines Brother   . Cancer Maternal Aunt     breast CA   Allergies  Allergen Reactions  . Benzonatate     Hot flash  . Dilaudid [Hydromorphone Hcl] Other (See Comments)    Face turned red; bp   Current Outpatient Prescriptions on File Prior to Visit  Medication Sig Dispense Refill  . Fluticasone Propionate 0.05 % LOTN Apply small amount to ear canal daily as needed 60 mL 3  . levothyroxine  (SYNTHROID, LEVOTHROID) 100 MCG tablet Take 1 tablet (100 mcg total) by mouth daily before breakfast. 90 tablet 3  . Multiple Vitamin (MULTIVITAMIN) tablet Take 1 tablet by mouth daily.    Marland Kitchen omeprazole (PRILOSEC) 10 MG capsule Take 1 capsule (10 mg total) by mouth daily. 30 capsule 11  . Probiotic Product (SOLUBLE FIBER/PROBIOTICS PO) Take by mouth as needed.      No current facility-administered medications on file prior to visit.    Review of Systems Review of Systems  Constitutional: Negative for fever, appetite change, fatigue and unexpected weight change.  Eyes: Negative for pain and visual disturbance.  Respiratory: Negative for cough and shortness of breath.   Cardiovascular: Negative for cp or palpitations    Gastrointestinal: Negative for nausea, diarrhea and constipation.  Genitourinary: Negative for urgency and frequency. neg for excess thirst or urination  Skin: Negative for pallor or rash   Neurological: Negative for weakness, light-headedness, numbness and headaches.  Hematological: Negative for adenopathy. Does not bruise/bleed easily.  Psychiatric/Behavioral: Negative for dysphoric mood. The patient is not nervous/anxious.         Objective:   Physical Exam  Constitutional: She appears well-developed and well-nourished. No distress.  obese and well appearing   HENT:  Head: Normocephalic and atraumatic.  Mouth/Throat: Oropharynx is clear and moist.  Eyes: Conjunctivae and EOM are normal. Pupils are equal, round, and reactive to light.  Neck: Normal range of motion. Neck supple. No JVD present. Carotid bruit is not present. No thyromegaly present.  Cardiovascular: Normal rate, regular rhythm, normal heart sounds and intact distal pulses.  Exam reveals no gallop.   Pulmonary/Chest: Effort normal and breath sounds normal. No respiratory distress. She has no wheezes. She has no rales.  No crackles  Abdominal: Soft. Bowel sounds are normal. She exhibits no distension, no  abdominal bruit and no mass. There is no tenderness.  Musculoskeletal: She exhibits no edema.  Lymphadenopathy:    She has no cervical adenopathy.  Neurological: She is alert. She has normal reflexes.  Skin: Skin is warm and dry. No rash noted.  Psychiatric: She has a normal mood and affect.          Assessment & Plan:   Problem List Items Addressed This Visit    Diabetes mellitus type 2, controlled, without complications - Primary    Will continue metformin 500 mg  Ref for DM teaching -disc goals for diet/exercise and wt management as well  Will stop hctz and start lisinopril for renal protection  F/u 3 mo with lab prior  Disc imp of foot and eye exams Disc end organ risks of DM and  reason to treat it  >25 minutes spent in face to face time with patient, >50% spent in counselling or coordination of care       Relevant Medications   lisinopril (PRINIVIL,ZESTRIL) 10 MG tablet   metFORMIN (GLUCOPHAGE) 500 MG tablet   Other Relevant Orders   Ambulatory referral to diabetic education   Essential hypertension    In light of DM2 dx , will change hctz to ace inhibitor  Disc poss side eff (cough/all rxn or hypotension)-disc what to watch for  F/u 3 mo         Relevant Medications   lisinopril (PRINIVIL,ZESTRIL) 10 MG tablet

## 2015-03-07 NOTE — Assessment & Plan Note (Addendum)
Will continue metformin 500 mg  Ref for DM teaching -disc goals for diet/exercise and wt management as well  Will stop hctz and start lisinopril for renal protection  F/u 3 mo with lab prior  Disc imp of foot and eye exams Disc end organ risks of DM and reason to treat it  New goal of cholesterol control is HDL over 40 and LDL under 100 and trig under 150 >25 minutes spent in face to face time with patient, >50% spent in counselling or coordination of care

## 2015-03-07 NOTE — Assessment & Plan Note (Signed)
In light of DM2 dx , will change hctz to ace inhibitor  Disc poss side eff (cough/all rxn or hypotension)-disc what to watch for  F/u 3 mo

## 2015-05-07 ENCOUNTER — Other Ambulatory Visit: Payer: Self-pay

## 2015-05-07 MED ORDER — OMEPRAZOLE 10 MG PO CPDR: 10.0000 mg | DELAYED_RELEASE_CAPSULE | Freq: Every day | ORAL | Status: DC

## 2015-05-08 ENCOUNTER — Encounter: Payer: Self-pay | Admitting: Internal Medicine

## 2015-05-08 ENCOUNTER — Ambulatory Visit (INDEPENDENT_AMBULATORY_CARE_PROVIDER_SITE_OTHER): Payer: BLUE CROSS/BLUE SHIELD | Admitting: Internal Medicine

## 2015-05-08 VITALS — BP 130/72 | HR 78 | Ht 66.0 in | Wt 178.6 lb

## 2015-05-08 DIAGNOSIS — I10 Essential (primary) hypertension: Secondary | ICD-10-CM | POA: Diagnosis not present

## 2015-05-08 DIAGNOSIS — G4733 Obstructive sleep apnea (adult) (pediatric): Secondary | ICD-10-CM | POA: Diagnosis not present

## 2015-05-08 NOTE — Patient Instructions (Addendum)
Please see patient coordinator before you leave today  to schedule [facility] to service your cpap and set up on autoset with download in 2 weeks  Be aware the lisinopril can cause your symptoms from allergies/ colds to be much worse but for now ok to stay on lisinipril

## 2015-05-08 NOTE — Progress Notes (Signed)
Patient ID: Rebecca Kramer, female   DOB: 05-30-1946      MRN: [id number]    Brief patient profile:  69 yowf never smoker prev followed by Dr Gwenette Greet for OSA   Final recs 08/03/14 Will have your cpap machine reset on auto 5-20cm Will have your home care company show you different masks.  Look at "respironics dreamwear mask". Work on [employer] loss   History of Present Illness  05/08/2015 1st   office visit/ Wert re: transition of care  Chief Complaint  Patient presents with  . SLEEP CONSULT    former [facility] pt. seen for OSA. currently wears CPAP 6-7 hours everynight. pressure feels too strong. no supplies needed. [id number]   she's confused as to what settings her cpap are on now but has lost wt and wants it adjusted. Denies hypersomnolence/ am ha   No obvious day to day or daytime variability or assoc chronic cough or cp or chest tightness, subjective wheeze or overt sinus or hb symptoms. No unusual exp hx or h/o childhood pna/ asthma or knowledge of premature birth.  Sleeping ok without nocturnal  or early am exacerbation  of respiratory  c/o's or need for noct saba. Also denies any obvious fluctuation of symptoms with weather or environmental changes or other aggravating or alleviating factors except as outlined above   Current Medications, Allergies, Complete Past Medical History, Past Surgical History, Family History, and Social History were reviewed in [employer] record.  ROS  The following are not active complaints unless bolded sore throat, dysphagia, dental problems, itching, sneezing,  nasal congestion or excess/ purulent secretions, ear ache,   fever, chills, sweats, unintended wt loss, classically pleuritic or exertional cp, hemoptysis,  orthopnea pnd or leg swelling, presyncope, palpitations, abdominal pain, anorexia, nausea, vomiting, diarrhea  or change in bowel or bladder habits, change in stools or urine, dysuria,hematuria,  rash, arthralgias, visual  complaints, headache, numbness, weakness or ataxia or problems with walking or coordination,  change in mood/affect or memory.      Objective:   Physical Exam    amb nad    Wt Readings from Last 3 Encounters:  05/08/15 178 lb 9.6 oz (81.012 kg)  03/06/15 191 lb 8 oz (86.864 kg)  01/14/15 191 lb 6.4 oz (86.818 kg)    Vital signs reviewed        HEENT: nl dentition, turbinates, and orophanx. Nl external ear canals without cough reflex Modified Mallampati Score = II/III   NECK :  without JVD/Nodes/TM/ nl carotid upstrokes bilaterally   LUNGS: no acc muscle use, clear to A and P bilaterally without cough on insp or exp maneuvers   CV:  RRR  no s3 or murmur or increase in P2, no edema   ABD:  soft and nontender with nl excursion in the supine position. No bruits or organomegaly, bowel sounds nl  MS:  warm without deformities, calf tenderness, cyanosis or clubbing  SKIN: warm and dry without lesions    NEURO:  alert, approp, no deficits        Assessment:

## 2015-05-09 ENCOUNTER — Encounter: Payer: Self-pay | Admitting: Internal Medicine

## 2015-05-09 NOTE — Assessment & Plan Note (Addendum)
NPSG 2007:  AHI 45/hr  Congratulated on wt loss and emphasized goal to get off cpap is try to get BMI down to < 25  (presenly = Body mass index is 28.84) but as she loses wt she may need her cpap adjusted down but can probably do this from home   Discussed with pt/ Dr Annamaria Boots:  Needs to first make sure her device is working well, be offered mask of choice and autoset with download to be reviewed by Dr Annamaria Boots and we will get her established with sleep doc w/in next 4-6 months  Total time devoted to counseling  = 15.87m ov   review case with pt/ discussion of options/alternatives/ giving and going over instructions (see avs)

## 2015-05-09 NOTE — Assessment & Plan Note (Signed)
In the best review of chronic cough to date ( NEJM 2016 375 [phone]) ,  ACEi are now felt to cause cough in up to  20% of pts which is a 4 fold increase from previous reports and does not include the variety of non-specific complaints we see in pulmonary clinic in pts on ACEi but previously attributed to copd/asthma to include PNDS, throat and chest congestion, "bronchitis", unexplained dyspnea and noct "strangling" sensations as well as atypical /refractory GERD symptoms like atypical dysphagia.   Ok for now to continue acei  but note of interest her brother had severe cough on ACEi and I suspect she may find this to be the case as well with next uri or "allergy" flare so rec low threshold to try ARB instead

## 2015-05-15 ENCOUNTER — Other Ambulatory Visit: Payer: Self-pay

## 2015-05-15 DIAGNOSIS — Z1231 Encounter for screening mammogram for malignant neoplasm of breast: Secondary | ICD-10-CM

## 2015-05-28 ENCOUNTER — Telehealth: Payer: Self-pay

## 2015-05-28 MED ORDER — LOSARTAN POTASSIUM 50 MG PO TABS: 50.0000 mg | ORAL_TABLET | Freq: Every day | ORAL | Status: DC

## 2015-05-28 NOTE — Telephone Encounter (Signed)
Left voicemail letting pt know of the change and if she is okay with switching meds to start the losartan when she runs out of the lisinopril and update [place] of any side eff or problems she has. Also left Amy with [facility] a message letting her know Dr. Glori Bickers made the change

## 2015-05-28 NOTE — Telephone Encounter (Signed)
Amy with [facility] left v/m; pt is in [employer]; pt request less expensive med than lisinopril; Amy recommends substituting ARB or losartan. Pt will be out of med on 05/29/15. Amy has already faxed over sheet with information about [facility] and the [facility].  Please advise.

## 2015-05-28 NOTE — Telephone Encounter (Signed)
A change to losartan is fine if pt is ok with that (I would have expected that to cost more than lisinopril-go figure!) If any problems or side eff let me know  I will send it to [facility] inst pt to stop lisinopril when she runs out and take losartan instead

## 2015-06-03 ENCOUNTER — Other Ambulatory Visit: Payer: BLUE CROSS/BLUE SHIELD

## 2015-06-04 ENCOUNTER — Other Ambulatory Visit (INDEPENDENT_AMBULATORY_CARE_PROVIDER_SITE_OTHER): Payer: BLUE CROSS/BLUE SHIELD

## 2015-06-04 DIAGNOSIS — I10 Essential (primary) hypertension: Secondary | ICD-10-CM

## 2015-06-04 DIAGNOSIS — E119 Type 2 diabetes mellitus without complications: Secondary | ICD-10-CM | POA: Diagnosis not present

## 2015-06-04 LAB — COMPREHENSIVE METABOLIC PANEL
ALK PHOS: 49 U/L (ref 39–117)
ALT: 18 U/L (ref 0–35)
AST: 19 U/L (ref 0–37)
Albumin: 4 g/dL (ref 3.5–5.2)
BILIRUBIN TOTAL: 0.4 mg/dL (ref 0.2–1.2)
BUN: 18 mg/dL (ref 6–23)
CALCIUM: 9.6 mg/dL (ref 8.4–10.5)
CO2: 29 mEq/L (ref 19–32)
Chloride: 104 mEq/L (ref 96–112)
Creatinine, Ser: 0.84 mg/dL (ref 0.40–1.20)
GFR: 71.34 mL/min (ref >60.00)
Glucose, Bld: 131 mg/dL — ABNORMAL HIGH (ref 70–99)
Potassium: 4.6 mEq/L (ref 3.5–5.1)
Sodium: 140 mEq/L (ref 135–145)
TOTAL PROTEIN: 6.7 g/dL (ref 6.0–8.3)

## 2015-06-04 LAB — LIPID PANEL
Cholesterol: 212 mg/dL — ABNORMAL HIGH (ref 0–200)
HDL: 47.9 mg/dL (ref >39.00)
LDL Cholesterol: 132 mg/dL — ABNORMAL HIGH (ref 0–99)
NONHDL: 164.34
TRIGLYCERIDES: 160 mg/dL — AB (ref 0.0–149.0)
Total CHOL/HDL Ratio: 4
VLDL: 32 mg/dL (ref 0.0–40.0)

## 2015-06-04 LAB — HEMOGLOBIN A1C: HEMOGLOBIN A1C: 6.4 % (ref 4.6–6.5)

## 2015-06-07 ENCOUNTER — Ambulatory Visit (INDEPENDENT_AMBULATORY_CARE_PROVIDER_SITE_OTHER): Payer: BLUE CROSS/BLUE SHIELD | Admitting: Family Medicine

## 2015-06-07 ENCOUNTER — Encounter: Payer: Self-pay | Admitting: Family Medicine

## 2015-06-07 VITALS — BP 122/80 | HR 68 | Temp 98.6°F | Ht 66.0 in | Wt 178.5 lb

## 2015-06-07 DIAGNOSIS — E7800 Pure hypercholesterolemia, unspecified: Secondary | ICD-10-CM

## 2015-06-07 DIAGNOSIS — E119 Type 2 diabetes mellitus without complications: Secondary | ICD-10-CM | POA: Diagnosis not present

## 2015-06-07 DIAGNOSIS — I10 Essential (primary) hypertension: Secondary | ICD-10-CM | POA: Diagnosis not present

## 2015-06-07 NOTE — Progress Notes (Signed)
Subjective:    Patient ID: Rebecca Kramer, female    DOB: [birth year], 69 y.o.   MRN: [id number]  HPI Here for f/u of chronic conditions  Wt is down from 191 - is thrilled with that !  Working hard on it  With [facility] weight management since Sept - following meal plan ( for DM) and also protein shakes and bars and small frequent meals  bmi of 28  bp is stable today  No cp or palpitations or headaches or edema  No side effects to medicines  BP Readings from Last 3 Encounters:  06/07/15 140/90  05/08/15 130/72  03/06/15 122/86    On losartan  Coughing is improved from what it was with ace  Has not checked at home   Diabetes Home sugar results -not checking blood glucose  DM diet - is good / loosing weight  Exercise - normally - when not on the road - silver sneakers and walking  Symptoms-nl  A1C last  Lab Results  Component Value Date   HGBA1C 6.4 06/04/2015  down from 6.8  Did DM ed at [facility] - and really liked it !  Learned a lot  No problems with medications  Renal protection ARB Last eye exam 8/16  Cholesterol Lab Results  Component Value Date   CHOL 212* 06/04/2015   CHOL 196 11/26/2014   CHOL 152 10/07/2012   Lab Results  Component Value Date   HDL 47.90 06/04/2015   HDL 47.90 11/26/2014   HDL 37.30* 10/07/2012   Lab Results  Component Value Date   LDLCALC 132* 06/04/2015   LDLCALC 113* 11/26/2014   LDLCALC 98 10/07/2012   Lab Results  Component Value Date   TRIG 160.0* 06/04/2015   TRIG 174.0* 11/26/2014   TRIG 86.0 10/07/2012   Lab Results  Component Value Date   CHOLHDL 4 06/04/2015   CHOLHDL 4 11/26/2014   CHOLHDL 4 10/07/2012   Lab Results  Component Value Date   LDLDIRECT 129.6 07/17/2011   LDLDIRECT 149.8 11/06/2009     she is really watching diet - except for thanksgiving  Loves cheese  Loves dairy , not a lot of egg yolks  Beef - not a lot / rarely    Patient Active Problem List   Diagnosis Date Noted  . Acute eczematoid  otitis externa of both ears 12/04/2014  . Routine general medical examination at a health care facility 11/30/2014  . Diabetes mellitus type 2, controlled, without complications ([place]) 0000000  . Conjunctivitis 10/12/2014  . Acute bronchitis 10/12/2014  . Viral URI with cough 09/25/2014  . Obstructive sleep apnea 02/27/2010  . PURE HYPERCHOLESTEROLEMIA 02/05/2010  . ECZEMA 11/07/2009  . INFLAMED SEBORRHEIC KERATOSIS 10/29/2008  . Osteopenia 10/08/2008  . Hypothyroidism due to Hashimoto's thyroiditis 10/29/2006  . Essential hypertension 10/29/2006  . REFLUX, ESOPHAGEAL 10/29/2006   Past Medical History  Diagnosis Date  . Hypertension   . Hypothyroidism   . Diverticulosis   . GERD (gastroesophageal reflux disease)     esophagitis with ulceration  . Osteopenia   . Bone spur     Left foot / posterior  . Menopausal symptoms     hot flashes  . Arthritis   . Sleep apnea 2007    AHI 45/hr in 2007  . Hyperthyroidism    Past Surgical History  Procedure Laterality Date  . Abdominal hysterectomy  1983    partial/endometriosis  . Breast surgery  1970's    breast lumpectomy/benign  . Breast  surgery  1994    breast implants/saline  . Thumb nodule      removed  . Esophagogastroduodenoscopy  03/2008    esophagitis with ulcerations/?barretts  . Tonsillectomy    . Dorsal compartment release  05/14/2011    Procedure: RELEASE DORSAL COMPARTMENT (DEQUERVAIN);  Surgeon: Cammie Sickle., MD;  Location: [facility];  Service: Orthopedics;  Laterality: Right;  right release dorsal compartment/ de quervain release with debridement of mucoid cyst   Social History  Substance Use Topics  . Smoking status: Never Smoker   . Smokeless tobacco: Never Used  . Alcohol Use: No   Family History  Problem Relation Age of Onset  . Hypertension Mother   . Hyperlipidemia Mother   . Colon polyps Mother   . Heart disease Mother   . Migraines Mother   . Cancer Father     lung CA    . Migraines Brother   . Cancer Maternal Aunt     breast CA   Allergies  Allergen Reactions  . Benzonatate     Hot flash  . Dilaudid [Hydromorphone Hcl] Other (See Comments)    Face turned red; bp   Current Outpatient Prescriptions on File Prior to Visit  Medication Sig Dispense Refill  . Fluticasone Propionate 0.05 % LOTN Apply small amount to ear canal daily as needed 60 mL 3  . levothyroxine (SYNTHROID, LEVOTHROID) 100 MCG tablet Take 1 tablet (100 mcg total) by mouth daily before breakfast. 90 tablet 3  . losartan (COZAAR) 50 MG tablet Take 1 tablet (50 mg total) by mouth daily. 30 tablet 11  . metFORMIN (GLUCOPHAGE) 500 MG tablet Take 1 tablet (500 mg total) by mouth 2 (two) times daily with a meal. 180 tablet 3  . Multiple Vitamin (MULTIVITAMIN) tablet Take 1 tablet by mouth daily.    Marland Kitchen omeprazole (PRILOSEC) 10 MG capsule Take 1 capsule (10 mg total) by mouth daily. 30 capsule 11   No current facility-administered medications on file prior to visit.    Review of Systems Review of Systems  Constitutional: Negative for fever, appetite change, fatigue and unexpected weight change.  Eyes: Negative for pain and visual disturbance.  Respiratory: Negative for cough and shortness of breath.   Cardiovascular: Negative for cp or palpitations    Gastrointestinal: Negative for nausea, diarrhea and constipation.  Genitourinary: Negative for urgency and frequency.  Skin: Negative for pallor or rash   Neurological: Negative for weakness, light-headedness, numbness and headaches.  Hematological: Negative for adenopathy. Does not bruise/bleed easily.  Psychiatric/Behavioral: Negative for dysphoric mood. The patient is not nervous/anxious.         Objective:   Physical Exam  Constitutional: She appears well-developed and well-nourished. No distress.  Well appearing  Wt loss noted   HENT:  Head: Normocephalic and atraumatic.  Mouth/Throat: Oropharynx is clear and moist.  Eyes:  Conjunctivae and EOM are normal. Pupils are equal, round, and reactive to light.  Neck: Normal range of motion. Neck supple. No JVD present. Carotid bruit is not present. No thyromegaly present.  Cardiovascular: Normal rate, regular rhythm, normal heart sounds and intact distal pulses.  Exam reveals no gallop.   Pulmonary/Chest: Effort normal and breath sounds normal. No respiratory distress. She has no wheezes. She has no rales.  No crackles  Abdominal: Soft. Bowel sounds are normal. She exhibits no distension, no abdominal bruit and no mass. There is no tenderness.  Musculoskeletal: She exhibits no edema.  Lymphadenopathy:    She has  no cervical adenopathy.  Neurological: She is alert. She has normal reflexes.  Skin: Skin is warm and dry. No rash noted.  Psychiatric: She has a normal mood and affect.          Assessment & Plan:

## 2015-06-07 NOTE — Patient Instructions (Signed)
Glucose control is better  Cholesterol is not at goal  BP is stable   For cholesterol  Avoid red meat/ fried foods/ egg yolks/ fatty breakfast meats/ butter, cheese and high fat dairy/ and shellfish     Follow up in 3 mo with labs prior

## 2015-06-07 NOTE — Progress Notes (Signed)
Pre visit review using our clinic review tool, if applicable. No additional management support is needed unless otherwise documented below in the visit note. 

## 2015-06-09 NOTE — Assessment & Plan Note (Signed)
Disc goals for lipids and reasons to control them Rev labs with pt Rev low sat fat diet in detail This is not at goal for DM Will work harder on diet  Re check 3 mo /consider tx if not at goal

## 2015-06-09 NOTE — Assessment & Plan Note (Signed)
bp in fair control at this time  BP Readings from Last 1 Encounters:  06/07/15 122/80   No changes needed Disc lifstyle change with low sodium diet and exercise   Enc further wt loss and better habits  Doing well with losartan and no cough

## 2015-06-09 NOTE — Assessment & Plan Note (Signed)
Lab Results  Component Value Date   HGBA1C 6.4 06/04/2015   Improved with D[facility] ed and diet and wt loss Commended  F/u 3 mo

## 2015-06-21 ENCOUNTER — Ambulatory Visit: Payer: BLUE CROSS/BLUE SHIELD

## 2015-07-15 ENCOUNTER — Ambulatory Visit: Payer: BLUE CROSS/BLUE SHIELD

## 2015-07-18 ENCOUNTER — Encounter: Payer: Self-pay | Admitting: Family Medicine

## 2015-07-18 ENCOUNTER — Ambulatory Visit
Admission: RE | Admit: 2015-07-18 | Discharge: 2015-07-18 | Disposition: A | Discharge status: Home / Self Care | Payer: BLUE CROSS/BLUE SHIELD | Source: Ambulatory Visit

## 2015-07-18 ENCOUNTER — Encounter: Payer: Self-pay | Admitting: *Deleted

## 2015-07-18 DIAGNOSIS — Z1231 Encounter for screening mammogram for malignant neoplasm of breast: Secondary | ICD-10-CM

## 2015-07-18 LAB — HM MAMMOGRAPHY: HM Mammogram: NORMAL

## 2015-07-30 ENCOUNTER — Other Ambulatory Visit: Payer: Self-pay | Admitting: *Deleted

## 2015-07-30 MED ORDER — METFORMIN HCL 500 MG PO TABS: 500.0000 mg | ORAL_TABLET | Freq: Two times a day (BID) | ORAL | Status: DC

## 2015-07-30 NOTE — Telephone Encounter (Signed)
Spoke with [place] at [place] and pt is there and needs Rx of metformin sent there (2week supply) until she gets her Rx in the mail, Rx called in over phone to [place]

## 2015-08-05 ENCOUNTER — Ambulatory Visit: Payer: BLUE CROSS/BLUE SHIELD | Admitting: Pulmonary Disease

## 2015-08-27 ENCOUNTER — Other Ambulatory Visit (INDEPENDENT_AMBULATORY_CARE_PROVIDER_SITE_OTHER): Payer: BLUE CROSS/BLUE SHIELD

## 2015-08-27 DIAGNOSIS — E7800 Pure hypercholesterolemia, unspecified: Secondary | ICD-10-CM | POA: Diagnosis not present

## 2015-08-27 DIAGNOSIS — E119 Type 2 diabetes mellitus without complications: Secondary | ICD-10-CM | POA: Diagnosis not present

## 2015-08-27 DIAGNOSIS — I10 Essential (primary) hypertension: Secondary | ICD-10-CM

## 2015-08-27 LAB — COMPREHENSIVE METABOLIC PANEL
ALBUMIN: 4.3 g/dL (ref 3.5–5.2)
ALT: 15 U/L (ref 0–35)
AST: 19 U/L (ref 0–37)
Alkaline Phosphatase: 41 U/L (ref 39–117)
BUN: 19 mg/dL (ref 6–23)
CHLORIDE: 102 meq/L (ref 96–112)
CO2: 30 mEq/L (ref 19–32)
Calcium: 9.3 mg/dL (ref 8.4–10.5)
Creatinine, Ser: 0.82 mg/dL (ref 0.40–1.20)
GFR: 73.3 mL/min (ref >60.00)
Glucose, Bld: 117 mg/dL — ABNORMAL HIGH (ref 70–99)
POTASSIUM: 4.2 meq/L (ref 3.5–5.1)
SODIUM: 138 meq/L (ref 135–145)
Total Bilirubin: 0.4 mg/dL (ref 0.2–1.2)
Total Protein: 6.9 g/dL (ref 6.0–8.3)

## 2015-08-27 LAB — LIPID PANEL
CHOL/HDL RATIO: 4
Cholesterol: 222 mg/dL — ABNORMAL HIGH (ref 0–200)
HDL: 59.3 mg/dL (ref >39.00)
LDL CALC: 138 mg/dL — AB (ref 0–99)
NonHDL: 162.63
Triglycerides: 125 mg/dL (ref 0.0–149.0)
VLDL: 25 mg/dL (ref 0.0–40.0)

## 2015-08-27 LAB — HEMOGLOBIN A1C: Hgb A1c MFr Bld: 6.6 % — ABNORMAL HIGH (ref 4.6–6.5)

## 2015-08-30 ENCOUNTER — Other Ambulatory Visit: Payer: BLUE CROSS/BLUE SHIELD

## 2015-09-06 ENCOUNTER — Encounter: Payer: Self-pay | Admitting: Family Medicine

## 2015-09-06 ENCOUNTER — Ambulatory Visit (INDEPENDENT_AMBULATORY_CARE_PROVIDER_SITE_OTHER): Payer: BLUE CROSS/BLUE SHIELD | Admitting: Family Medicine

## 2015-09-06 VITALS — BP 134/90 | HR 70 | Temp 98.3°F | Ht 66.0 in | Wt 184.5 lb

## 2015-09-06 DIAGNOSIS — I10 Essential (primary) hypertension: Secondary | ICD-10-CM

## 2015-09-06 DIAGNOSIS — E7800 Pure hypercholesterolemia, unspecified: Secondary | ICD-10-CM | POA: Diagnosis not present

## 2015-09-06 DIAGNOSIS — E119 Type 2 diabetes mellitus without complications: Secondary | ICD-10-CM | POA: Diagnosis not present

## 2015-09-06 NOTE — Progress Notes (Signed)
Pre visit review using our clinic review tool, if applicable. No additional management support is needed unless otherwise documented below in the visit note. 

## 2015-09-06 NOTE — Progress Notes (Signed)
Subjective:    Patient ID: Rebecca Kramer, female    DOB: [birth year], 70 y.o.   MRN: [id number]  HPI Here for f/u of chronic   Still coughs a bit  She cut her losartan in 1/2 -- thought it was making her arms and legs all asleep   bp is up  BP Readings from Last 3 Encounters:  09/06/15 134/90  06/07/15 122/80  05/08/15 130/72    Wt is up 6 lb  bmi of 17  Was prev in a program with [facility] for wt man in the past -- for weight loss/ centered around DM Had counseling with it    Has had 2 major deaths in the family and she had to take a break from it - could not go to the sessions  And also did not have time to prep food and exercise  Exercise - has not been back yet - silver sneakers   DM2 Lab Results  Component Value Date   HGBA1C 6.6* 08/27/2015   This is up from 6.4 due to poor eating   Cholesterol Not at goal Lab Results  Component Value Date   CHOL 222* 08/27/2015   CHOL 212* 06/04/2015   CHOL 196 11/26/2014   Lab Results  Component Value Date   HDL 59.30 08/27/2015   HDL 47.90 06/04/2015   HDL 47.90 11/26/2014   Lab Results  Component Value Date   LDLCALC 138* 08/27/2015   LDLCALC 132* 06/04/2015   LDLCALC 113* 11/26/2014   Lab Results  Component Value Date   TRIG 125.0 08/27/2015   TRIG 160.0* 06/04/2015   TRIG 174.0* 11/26/2014   Lab Results  Component Value Date   CHOLHDL 4 08/27/2015   CHOLHDL 4 06/04/2015   CHOLHDL 4 11/26/2014   Lab Results  Component Value Date   LDLDIRECT 129.6 07/17/2011   LDLDIRECT 149.8 11/06/2009   HDL is up but LDL is up also   Patient Active Problem List   Diagnosis Date Noted  . Acute eczematoid otitis externa of both ears 12/04/2014  . Routine general medical examination at a health care facility 11/30/2014  . Diabetes mellitus type 2, controlled, without complications ([place]) 0000000  . Conjunctivitis 10/12/2014  . Obstructive sleep apnea 02/27/2010  . PURE HYPERCHOLESTEROLEMIA 02/05/2010  .  ECZEMA 11/07/2009  . INFLAMED SEBORRHEIC KERATOSIS 10/29/2008  . Osteopenia 10/08/2008  . Hypothyroidism due to Hashimoto's thyroiditis 10/29/2006  . Essential hypertension 10/29/2006  . REFLUX, ESOPHAGEAL 10/29/2006   Past Medical History  Diagnosis Date  . Hypertension   . Hypothyroidism   . Diverticulosis   . GERD (gastroesophageal reflux disease)     esophagitis with ulceration  . Osteopenia   . Bone spur     Left foot / posterior  . Menopausal symptoms     hot flashes  . Arthritis   . Sleep apnea 2007    AHI 45/hr in 2007  . Hyperthyroidism    Past Surgical History  Procedure Laterality Date  . Abdominal hysterectomy  1983    partial/endometriosis  . Breast surgery  1970's    breast lumpectomy/benign  . Breast surgery  1994    breast implants/saline  . Thumb nodule      removed  . Esophagogastroduodenoscopy  03/2008    esophagitis with ulcerations/?barretts  . Tonsillectomy    . Dorsal compartment release  05/14/2011    Procedure: RELEASE DORSAL COMPARTMENT (DEQUERVAIN);  Surgeon: Cammie Sickle., MD;  Location[facility];  Service: Orthopedics;  Laterality: Right;  right release dorsal compartment/ de quervain release with debridement of mucoid cyst   Social History  Substance Use Topics  . Smoking status: Never Smoker   . Smokeless tobacco: Never Used  . Alcohol Use: No   Family History  Problem Relation Age of Onset  . Hypertension Mother   . Hyperlipidemia Mother   . Colon polyps Mother   . Heart disease Mother   . Migraines Mother   . Cancer Father     lung CA  . Migraines Brother   . Cancer Maternal Aunt     breast CA   Allergies  Allergen Reactions  . Benzonatate     Hot flash  . Dilaudid [Hydromorphone Hcl] Other (See Comments)    Face turned red; bp   Current Outpatient Prescriptions on File Prior to Visit  Medication Sig Dispense Refill  . Fluticasone Propionate 0.05 % LOTN Apply small amount to ear canal daily as  needed 60 mL 3  . levothyroxine (SYNTHROID, LEVOTHROID) 100 MCG tablet Take 1 tablet (100 mcg total) by mouth daily before breakfast. 90 tablet 3  . losartan (COZAAR) 50 MG tablet Take 1 tablet (50 mg total) by mouth daily. 30 tablet 11  . metFORMIN (GLUCOPHAGE) 500 MG tablet Take 1 tablet (500 mg total) by mouth 2 (two) times daily with a meal. 28 tablet 0  . Multiple Vitamin (MULTIVITAMIN) tablet Take 1 tablet by mouth daily.    Marland Kitchen omeprazole (PRILOSEC) 10 MG capsule Take 1 capsule (10 mg total) by mouth daily. 30 capsule 11   No current facility-administered medications on file prior to visit.    Review of Systems Review of Systems  Constitutional: Negative for fever, appetite change,  and unexpected weight change.  Eyes: Negative for pain and visual disturbance.  Respiratory: Negative for cough and shortness of breath.   Cardiovascular: Negative for cp or palpitations    Gastrointestinal: Negative for nausea, diarrhea and constipation.  Genitourinary: Negative for urgency and frequency.  Skin: Negative for pallor or rash   Neurological: Negative for weakness, light-headedness, numbness and headaches.  Hematological: Negative for adenopathy. Does not bruise/bleed easily.  Psychiatric/Behavioral: Negative for dysphoric mood. The patient is not nervous/anxious.  pos for recent stressors        Objective:   Physical Exam  Constitutional: She appears well-developed and well-nourished. No distress.  overwt and well appearing   HENT:  Head: Normocephalic and atraumatic.  Mouth/Throat: Oropharynx is clear and moist.  Eyes: Conjunctivae and EOM are normal. Pupils are equal, round, and reactive to light.  Neck: Normal range of motion. Neck supple. No JVD present. Carotid bruit is not present. No thyromegaly present.  Cardiovascular: Normal rate, regular rhythm, normal heart sounds and intact distal pulses.  Exam reveals no gallop.   Pulmonary/Chest: Effort normal and breath sounds normal.  No respiratory distress. She has no wheezes. She has no rales.  No crackles  Abdominal: Soft. Bowel sounds are normal. She exhibits no distension, no abdominal bruit and no mass. There is no tenderness.  Musculoskeletal: She exhibits no edema.  Lymphadenopathy:    She has no cervical adenopathy.  Neurological: She is alert. She has normal reflexes.  Skin: Skin is warm and dry. No rash noted.  Psychiatric: She has a normal mood and affect.          Assessment & Plan:   Problem List Items Addressed This Visit      Cardiovascular and Mediastinum   Essential  hypertension - Primary    bp is up after cutting losartan in 1/2 for ? Side effects She will try going back to a whole pill and update if any problems return  BP Readings from Last 1 Encounters:  09/06/15 134/90   Disc DASH diet and getting back to better habits  Disc lifstyle change with low sodium diet and exercise          Endocrine   Diabetes mellitus type 2, controlled, without complications ([facility])    Lab Results  Component Value Date   HGBA1C 6.6* 08/27/2015   This is up a bit with a period of stressors and bad eating and less exercise  Plan to get back to the gym and get back on track  Re check 3 mo  No change in therapy        Other   PURE HYPERCHOLESTEROLEMIA    LDL is up (HDL also up)  This was after a period of stressors and worse eating  Disc goals for DM - LDL under 100- she declines statin/or other cholesterol med at this time Disc goals for lipids and reasons to control them Rev labs with pt Rev low sat fat diet in detail Re check 3 mo

## 2015-09-06 NOTE — Patient Instructions (Addendum)
Go back to a whole pill of losartan- if the side effects you thought you had come back let me know  You need to get your cholesterol down - Avoid red meat/ fried foods/ egg yolks/ fatty breakfast meats/ butter, cheese and high fat dairy/ and shellfish   A1C was only up to 6.6  Get back on your program  Do get back to exercise   Try to get 1200-1500 mg of calcium per day with at least 1000 iu of vitamin D - for bone health   Follow up in 3 months with labs prior - for annual exam

## 2015-09-07 NOTE — Assessment & Plan Note (Signed)
bp is up after cutting losartan in 1/2 for ? Side effects She will try going back to a whole pill and update if any problems return  BP Readings from Last 1 Encounters:  09/06/15 134/90   Disc DASH diet and getting back to better habits  Disc lifstyle change with low sodium diet and exercise

## 2015-09-07 NOTE — Assessment & Plan Note (Signed)
Lab Results  Component Value Date   HGBA1C 6.6* 08/27/2015   This is up a bit with a period of stressors and bad eating and less exercise  Plan to get back to the gym and get back on track  Re check 3 mo  No change in therapy

## 2015-09-07 NOTE — Assessment & Plan Note (Signed)
LDL is up (HDL also up)  This was after a period of stressors and worse eating  Disc goals for DM - LDL under 100- she declines statin/or other cholesterol med at this time Disc goals for lipids and reasons to control them Rev labs with pt Rev low sat fat diet in detail Re check 3 mo

## 2015-10-09 ENCOUNTER — Telehealth: Payer: Self-pay

## 2015-10-09 MED ORDER — METFORMIN HCL ER 500 MG PO TB24: 1000.0000 mg | ORAL_TABLET | Freq: Every day | ORAL | Status: DC

## 2015-10-09 NOTE — Telephone Encounter (Signed)
Pharmacy advised Rx sent

## 2015-10-09 NOTE — Telephone Encounter (Signed)
That is fine with me  I sent it to [facility]

## 2015-10-09 NOTE — Telephone Encounter (Signed)
Amy at [facility] left v/m pt having problem remembering to take 2nd dose of metformin each day. Amy wants to know if Dr Glori Bickers would change pt to metformin XR 1000 mg once daily or metformin XR 500mg  two tabs once a day. Amy request cb.

## 2015-11-04 ENCOUNTER — Ambulatory Visit (INDEPENDENT_AMBULATORY_CARE_PROVIDER_SITE_OTHER): Payer: BLUE CROSS/BLUE SHIELD | Admitting: Pulmonary Disease

## 2015-11-04 VITALS — BP 128/82 | HR 76 | Ht 66.0 in | Wt 186.0 lb

## 2015-11-04 DIAGNOSIS — K219 Gastro-esophageal reflux disease without esophagitis: Secondary | ICD-10-CM | POA: Diagnosis not present

## 2015-11-04 DIAGNOSIS — G4733 Obstructive sleep apnea (adult) (pediatric): Secondary | ICD-10-CM

## 2015-11-04 DIAGNOSIS — G4710 Hypersomnia, unspecified: Secondary | ICD-10-CM

## 2015-11-04 DIAGNOSIS — G4730 Sleep apnea, unspecified: Secondary | ICD-10-CM

## 2015-11-04 NOTE — Patient Instructions (Signed)
1. We will order you a new cpap machine. Will set autocpap 5-15 cm h2O. 2. Let us know if you are having issues with your cpap.  Return to clinic in 1 yr.

## 2015-11-04 NOTE — Assessment & Plan Note (Addendum)
NPSG 2007:  AHI 45/hr Optimal pressure 2012 by auto:  10cm.  Pt wished to stay on auto mode.   On autocpap 5-20 cm H2O -- still on the same machine as baseline. Curreent cpap machine has a lot of noise. Sometimes, it does not deliver enough pressure. The machine has lost its luster through the years.  She is some sleepy now. Esp the last 1 yr. Machine is old.  DL x 3 mos until 10/2015 on autocpap 5-20 > 88%, AHI 1.   Plan : 1. Try to get an auto CPAP machine, 5-15 cm water. My assumption is she will NOT need a sleep study as current compliance shows good correction and chest a same insurance is 10 years ago. I would also assume she'll not need a follow-up after she gets the new CPAP machine within 1-3 months. 2. Follow-up in a year. 3. Advised on sleep hygiene.

## 2015-11-04 NOTE — Assessment & Plan Note (Signed)
OSA corrected. Some hypersomnia through the years assoc with current machine. Pan to get a new cpap.  Pt will call if persistent.

## 2015-11-04 NOTE — Assessment & Plan Note (Signed)
GERD stable.

## 2015-11-04 NOTE — Progress Notes (Signed)
Subjective:    Patient ID: Rebecca Kramer, female    DOB: 1946-03-27, 70 y.o.   MRN: [id number]  HPI Pt is being seen at the office for OSA  [name] (11/04/15) Last seen in 05/2015. Has OSA. Felt pressure was too much. On autocpap 5-20 cm H2O -- still on the same machine as baseline. Curreent cpap machine has a lot of noise. Sometimes, it does not deliver enough pressure. The machine has lost its luster through the years.  She is some sleepy now. Esp the last 1 yr. Machine is old. Was dxed with DM recently.    Review of Systems  Constitutional: Negative.   HENT: Negative.   Eyes: Negative.   Respiratory: Negative.   Cardiovascular: Negative.   Gastrointestinal: Negative.   Endocrine: Negative.   Genitourinary: Negative.   Musculoskeletal: Negative.   Allergic/Immunologic: Negative.   Neurological: Negative.   Hematological: Negative.   Psychiatric/Behavioral: Negative.   All other systems reviewed and are negative.   Past Medical History  Diagnosis Date  . Hypertension   . Hypothyroidism   . Diverticulosis   . GERD (gastroesophageal reflux disease)     esophagitis with ulceration  . Osteopenia   . Bone spur     Left foot / posterior  . Menopausal symptoms     hot flashes  . Arthritis   . Sleep apnea 2007    AHI 45/hr in 2007  . Hyperthyroidism      Family History  Problem Relation Age of Onset  . Hypertension Mother   . Hyperlipidemia Mother   . Colon polyps Mother   . Heart disease Mother   . Migraines Mother   . Cancer Father     lung CA  . Migraines Brother   . Cancer Maternal Aunt     breast CA     Past Surgical History  Procedure Laterality Date  . Abdominal hysterectomy  1983    partial/endometriosis  . Breast surgery  1970's    breast lumpectomy/benign  . Breast surgery  1994    breast implants/saline  . Thumb nodule      removed  . Esophagogastroduodenoscopy  03/2008    esophagitis with ulcerations/?barretts  . Tonsillectomy    . Dorsal  compartment release  05/14/2011    Procedure: RELEASE DORSAL COMPARTMENT (DEQUERVAIN);  Surgeon: Cammie Sickle., MD;  Location: [facility];  Service: Orthopedics;  Laterality: Right;  right release dorsal compartment/ de quervain release with debridement of mucoid cyst    Social History   Social History  . Marital Status: Married    Spouse Name: N/A  . Number of Children: N/A  . Years of Education: N/A   Occupational History  . Not on file.   Social History Main Topics  . Smoking status: Never Smoker   . Smokeless tobacco: Never Used  . Alcohol Use: No  . Drug Use: No  . Sexual Activity: Not on file   Other Topics Concern  . Not on file   Social History Narrative     Allergies  Allergen Reactions  . Benzonatate     Hot flash  . Dilaudid [Hydromorphone Hcl] Other (See Comments)    Face turned red; bp     Outpatient Prescriptions Prior to Visit  Medication Sig Dispense Refill  . Fluticasone Propionate 0.05 % LOTN Apply small amount to ear canal daily as needed 60 mL 3  . levothyroxine (SYNTHROID, LEVOTHROID) 100 MCG tablet Take 1 tablet (100 mcg  total) by mouth daily before breakfast. 90 tablet 3  . losartan (COZAAR) 50 MG tablet Take 1 tablet (50 mg total) by mouth daily. 30 tablet 11  . metFORMIN (GLUCOPHAGE-XR) 500 MG 24 hr tablet Take 2 tablets (1,000 mg total) by mouth daily with breakfast. 60 tablet 11  . Multiple Vitamin (MULTIVITAMIN) tablet Take 1 tablet by mouth daily.    Marland Kitchen omeprazole (PRILOSEC) 10 MG capsule Take 1 capsule (10 mg total) by mouth daily. 30 capsule 11   No facility-administered medications prior to visit.   No orders of the defined types were placed in this encounter.           Objective:   Physical Exam   Vitals:  Filed Vitals:   11/04/15 0914  BP: 128/82  Pulse: 76  Height: 5\' 6"  (1.676 m)  Weight: 186 lb (84.369 kg)  SpO2: 93%    Constitutional/General:  Pleasant, well-nourished, well-developed, not in  any distress,  Comfortably seating.  Well kempt  Body mass index is 30.04 kg/(m^2). Wt Readings from Last 3 Encounters:  11/04/15 186 lb (84.369 kg)  09/06/15 184 lb 8 oz (83.689 kg)  06/07/15 178 lb 8 oz (80.967 kg)     HEENT: Pupils equal and reactive to light and accommodation. Anicteric sclerae. Normal nasal mucosa.   No oral  lesions,  mouth clear,  oropharynx clear, no postnasal drip. (-) Oral thrush. No dental caries.  Airway - Mallampati class III  Neck: No masses. Midline trachea. No JVD, (-) LAD. (-) bruits appreciated.  Respiratory/Chest: Grossly normal chest. (-) deformity. (-) Accessory muscle use.  Symmetric expansion. (-) Tenderness on palpation.  Resonant on percussion.  Diminished BS on both lower lung zones. (-) wheezing, crackles, rhonchi (-) egophony  Cardiovascular: Regular rate and  rhythm, heart sounds normal, no murmur or gallops, no peripheral edema  Gastrointestinal:  Normal bowel sounds. Soft, non-tender. No hepatosplenomegaly.  (-) masses.   Musculoskeletal:  Normal muscle tone. Normal gait.   Extremities: Grossly normal. (-) clubbing, cyanosis.  (-) edema  Skin: (-) rash,lesions seen.   Neurological/Psychiatric : alert, oriented to time, place, person. Normal mood and affect           Assessment & Plan:  Obstructive sleep apnea NPSG 2007:  AHI 45/hr Optimal pressure 2012 by auto:  10cm.  Pt wished to stay on auto mode.   On autocpap 5-20 cm H2O -- still on the same machine as baseline. Curreent cpap machine has a lot of noise. Sometimes, it does not deliver enough pressure. The machine has lost its luster through the years.  She is some sleepy now. Esp the last 1 yr. Machine is old.  DL x 3 mos until 10/2015 on autocpap 5-20 > 88%, AHI 1.   Plan : 1. Try to get an auto CPAP machine, 5-15 cm water. My assumption is she will NOT need a sleep study as current compliance shows good correction and chest a same insurance is 10 years ago.  I would also assume she'll not need a follow-up after she gets the new CPAP machine within 1-3 months. 2. Follow-up in a year. 3. Advised on sleep hygiene.   REFLUX, ESOPHAGEAL GERD stable.   Hypersomnia with sleep apnea OSA corrected. Some hypersomnia through the years assoc with current machine. Pan to get a new cpap.  Pt will call if persistent.    Return to clinic in 1 yr. Sooner if needed by insurance.   Monica Becton, MD 11/04/2015,  9:36 AM [facility] Pager [phone] After 3 pm or if no answer, call [phone]

## 2015-11-11 ENCOUNTER — Encounter: Payer: Self-pay | Admitting: Pulmonary Disease

## 2015-11-11 ENCOUNTER — Telehealth: Payer: Self-pay | Admitting: *Deleted

## 2015-11-11 NOTE — Telephone Encounter (Signed)
Pt called stating that she did some research and spoke with her pharmacist about switching the time that she takes her levothyroxine. She would like to take it at night before bed and wants to know if she needs labs prior to doing this to see if she needs a dosage change. Please advise.

## 2015-11-12 ENCOUNTER — Other Ambulatory Visit: Payer: Self-pay | Admitting: *Deleted

## 2015-11-12 DIAGNOSIS — E038 Other specified hypothyroidism: Secondary | ICD-10-CM

## 2015-11-12 DIAGNOSIS — E063 Autoimmune thyroiditis: Principal | ICD-10-CM

## 2015-11-12 NOTE — Telephone Encounter (Signed)
OK 

## 2015-11-12 NOTE — Telephone Encounter (Signed)
Called pt and advised her per Dr Arman Filter message. Pt voiced that she would like to try and if there is no difference in the results, she will return to taking her thyroid medication in the mornings. Labs entered. Pt will go to [facility] for labs. Be advised.

## 2015-11-12 NOTE — Telephone Encounter (Signed)
I would NOT recommend this. If she absolutely wants to do this, will need labs 1.5 mo after the change. We can check them now, also: TSH and fT4.

## 2015-11-19 ENCOUNTER — Other Ambulatory Visit (INDEPENDENT_AMBULATORY_CARE_PROVIDER_SITE_OTHER): Payer: BLUE CROSS/BLUE SHIELD

## 2015-11-19 DIAGNOSIS — E063 Autoimmune thyroiditis: Secondary | ICD-10-CM

## 2015-11-19 DIAGNOSIS — E038 Other specified hypothyroidism: Secondary | ICD-10-CM

## 2015-11-19 LAB — TSH: TSH: 1.27 u[IU]/mL (ref 0.35–4.50)

## 2015-11-19 LAB — T4, FREE: Free T4: 1.02 ng/dL (ref 0.60–1.60)

## 2015-12-02 ENCOUNTER — Telehealth: Payer: Self-pay | Admitting: Family Medicine

## 2015-12-02 DIAGNOSIS — E119 Type 2 diabetes mellitus without complications: Secondary | ICD-10-CM

## 2015-12-02 DIAGNOSIS — I10 Essential (primary) hypertension: Secondary | ICD-10-CM

## 2015-12-02 DIAGNOSIS — E7800 Pure hypercholesterolemia, unspecified: Secondary | ICD-10-CM

## 2015-12-02 NOTE — Telephone Encounter (Signed)
-----   Message from Ellamae Sia sent at 11/28/2015 10:44 AM EDT ----- Regarding: Lab orders for 6.1.17 Patient is scheduled for CPX labs, please order future labs, Thanks , Karna Christmas

## 2015-12-05 ENCOUNTER — Other Ambulatory Visit (INDEPENDENT_AMBULATORY_CARE_PROVIDER_SITE_OTHER): Payer: BLUE CROSS/BLUE SHIELD

## 2015-12-05 DIAGNOSIS — E119 Type 2 diabetes mellitus without complications: Secondary | ICD-10-CM | POA: Diagnosis not present

## 2015-12-05 DIAGNOSIS — E7800 Pure hypercholesterolemia, unspecified: Secondary | ICD-10-CM

## 2015-12-05 DIAGNOSIS — I10 Essential (primary) hypertension: Secondary | ICD-10-CM

## 2015-12-05 LAB — LIPID PANEL
CHOLESTEROL: 210 mg/dL — AB (ref 0–200)
HDL: 53.5 mg/dL (ref >39.00)
LDL Cholesterol: 132 mg/dL — ABNORMAL HIGH (ref 0–99)
NONHDL: 156.9
Total CHOL/HDL Ratio: 4
Triglycerides: 125 mg/dL (ref 0.0–149.0)
VLDL: 25 mg/dL (ref 0.0–40.0)

## 2015-12-05 LAB — CBC WITH DIFFERENTIAL/PLATELET
BASOS ABS: 0 10*3/uL (ref 0.0–0.1)
BASOS PCT: 0.6 % (ref 0.0–3.0)
EOS ABS: 0.2 10*3/uL (ref 0.0–0.7)
Eosinophils Relative: 3.1 % (ref 0.0–5.0)
HEMATOCRIT: 41.8 % (ref 36.0–46.0)
HEMOGLOBIN: 13.8 g/dL (ref 12.0–15.0)
LYMPHS PCT: 35.5 % (ref 12.0–46.0)
Lymphs Abs: 2.6 10*3/uL (ref 0.7–4.0)
MCHC: 32.9 g/dL (ref 30.0–36.0)
MCV: 85.9 fl (ref 78.0–100.0)
MONO ABS: 0.5 10*3/uL (ref 0.1–1.0)
Monocytes Relative: 7.1 % (ref 3.0–12.0)
Neutro Abs: 3.9 10*3/uL (ref 1.4–7.7)
Neutrophils Relative %: 53.7 % (ref 43.0–77.0)
Platelets: 302 10*3/uL (ref 150.0–400.0)
RBC: 4.87 Mil/uL (ref 3.87–5.11)
RDW: 14.8 % (ref 11.5–15.5)
WBC: 7.3 10*3/uL (ref 4.0–10.5)

## 2015-12-05 LAB — COMPREHENSIVE METABOLIC PANEL
ALBUMIN: 4.3 g/dL (ref 3.5–5.2)
ALK PHOS: 39 U/L (ref 39–117)
ALT: 13 U/L (ref 0–35)
AST: 16 U/L (ref 0–37)
BILIRUBIN TOTAL: 0.4 mg/dL (ref 0.2–1.2)
BUN: 16 mg/dL (ref 6–23)
CALCIUM: 9.3 mg/dL (ref 8.4–10.5)
CO2: 30 mEq/L (ref 19–32)
CREATININE: 0.82 mg/dL (ref 0.40–1.20)
Chloride: 102 mEq/L (ref 96–112)
GFR: 73.25 mL/min (ref >60.00)
Glucose, Bld: 112 mg/dL — ABNORMAL HIGH (ref 70–99)
Potassium: 4.5 mEq/L (ref 3.5–5.1)
SODIUM: 137 meq/L (ref 135–145)
TOTAL PROTEIN: 6.9 g/dL (ref 6.0–8.3)

## 2015-12-05 LAB — TSH: TSH: 1.97 u[IU]/mL (ref 0.35–4.50)

## 2015-12-05 LAB — HEMOGLOBIN A1C: HEMOGLOBIN A1C: 6.4 % (ref 4.6–6.5)

## 2015-12-09 ENCOUNTER — Encounter: Payer: Self-pay | Admitting: Family Medicine

## 2015-12-09 ENCOUNTER — Ambulatory Visit (INDEPENDENT_AMBULATORY_CARE_PROVIDER_SITE_OTHER): Payer: BLUE CROSS/BLUE SHIELD | Admitting: Family Medicine

## 2015-12-09 VITALS — BP 130/78 | HR 78 | Temp 98.6°F | Ht 65.0 in | Wt 182.5 lb

## 2015-12-09 DIAGNOSIS — I10 Essential (primary) hypertension: Secondary | ICD-10-CM | POA: Diagnosis not present

## 2015-12-09 DIAGNOSIS — E038 Other specified hypothyroidism: Secondary | ICD-10-CM

## 2015-12-09 DIAGNOSIS — Z0000 Encounter for general adult medical examination without abnormal findings: Secondary | ICD-10-CM

## 2015-12-09 DIAGNOSIS — K219 Gastro-esophageal reflux disease without esophagitis: Secondary | ICD-10-CM

## 2015-12-09 DIAGNOSIS — E7800 Pure hypercholesterolemia, unspecified: Secondary | ICD-10-CM

## 2015-12-09 DIAGNOSIS — Z23 Encounter for immunization: Secondary | ICD-10-CM

## 2015-12-09 DIAGNOSIS — E119 Type 2 diabetes mellitus without complications: Secondary | ICD-10-CM

## 2015-12-09 DIAGNOSIS — E063 Autoimmune thyroiditis: Secondary | ICD-10-CM

## 2015-12-09 DIAGNOSIS — M8580 Other specified disorders of bone density and structure, unspecified site: Secondary | ICD-10-CM

## 2015-12-09 NOTE — Assessment & Plan Note (Signed)
Reviewed last dexa- stable  No falls or fx Adv to change mvi to ca plus D bid instead   Disc need for calcium/ vitamin D/ wt bearing exercise and bone density test every 2 y to monitor Disc safety/ fracture risk in detail

## 2015-12-09 NOTE — Assessment & Plan Note (Signed)
Reviewed health habits including diet and exercise and skin cancer prevention Reviewed appropriate screening tests for age  Also reviewed health mt list, fam hx and immunization status , as well as social and family history   See HPI Labs reviewed  Enc to make eye doctor appt this summer  Tdap vaccine today  Enc to start ca plus D instead of daily mvi  Enc exercise

## 2015-12-09 NOTE — Assessment & Plan Note (Signed)
Well controlled metformin and diet and exercise Lab Results  Component Value Date   HGBA1C 6.4 12/05/2015   Enc wt loss and good habits  She will make her own eye appt this summer

## 2015-12-09 NOTE — Assessment & Plan Note (Signed)
bp in fair control at this time  BP Readings from Last 1 Encounters:  12/09/15 130/78   No changes needed Disc lifstyle change with low sodium diet and exercise  Labs reviewed

## 2015-12-09 NOTE — Assessment & Plan Note (Signed)
Doing ok off ppi  Watching diet and weight

## 2015-12-09 NOTE — Assessment & Plan Note (Signed)
Sees endocrinology Lab Results  Component Value Date   TSH 1.97 12/05/2015

## 2015-12-09 NOTE — Progress Notes (Signed)
Pre visit review using our clinic review tool, if applicable. No additional management support is needed unless otherwise documented below in the visit note. 

## 2015-12-09 NOTE — Assessment & Plan Note (Signed)
Not at goal but pt declines tx of any kind Disc goals for lipids and reasons to control them Rev labs with pt Rev low sat fat diet in detail Given handout on low cholesterol diet

## 2015-12-09 NOTE — Progress Notes (Signed)
Subjective:    Patient ID: Rebecca Kramer, female    DOB: Nov 27, 1945, 70 y.o.   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)  [place] 1/17-normal Self breast exam -no lumps or changes   Colonoscopy 9/09-normal   Zoster vaccine 5/11  dexa 6/13-osteopenia w/o significant change  D level over 50 last year Takes ca and D (mvi) Exercises  No falls in the past year  She does drink milk  Only fracture was a toe several years ago    Weaned herself off omeprazole and doing ok  Rarely takes tums   bp is stable today -on losartan  No cp or palpitations or headaches or edema  No side effects to medicines  BP Readings from Last 3 Encounters:  12/09/15 142/74  11/04/15 128/82  09/06/15 134/90     Completed both pneumonia vaccines   Sees endocrinology  Lab Results  Component Value Date   TSH 1.97 12/05/2015     DM2 Last eye exam 8/16 Lab Results  Component Value Date   HGBA1C 6.4 12/05/2015   This is down from 6.6 On metformin (time released) In good control -eats a healthy diet and exercise   Cholesterol Lab Results  Component Value Date   CHOL 210* 12/05/2015   CHOL 222* 08/27/2015   CHOL 212* 06/04/2015   Lab Results  Component Value Date   HDL 53.50 12/05/2015   HDL 59.30 08/27/2015   HDL 47.90 06/04/2015   Lab Results  Component Value Date   LDLCALC 132* 12/05/2015   LDLCALC 138* 08/27/2015   LDLCALC 132* 06/04/2015   Lab Results  Component Value Date   TRIG 125.0 12/05/2015   TRIG 125.0 08/27/2015   TRIG 160.0* 06/04/2015   Lab Results  Component Value Date   CHOLHDL 4 12/05/2015   CHOLHDL 4 08/27/2015   CHOLHDL 4 06/04/2015   Lab Results  Component Value Date   LDLDIRECT 129.6 07/17/2011   LDLDIRECT 149.8 11/06/2009    Patient Active Problem List   Diagnosis Date Noted  . Hypersomnia with sleep apnea 11/04/2015  . Acute eczematoid otitis externa of both ears 12/04/2014  . Routine general medical examination at a health care facility 11/30/2014  . Diabetes mellitus type 2, controlled, without complications ([place]) 0000000  . Obstructive sleep apnea 02/27/2010  . PURE HYPERCHOLESTEROLEMIA 02/05/2010  . ECZEMA 11/07/2009  . INFLAMED SEBORRHEIC KERATOSIS 10/29/2008  . Osteopenia 10/08/2008  . Hypothyroidism due to Hashimoto's thyroiditis 10/29/2006  . Essential hypertension 10/29/2006  . REFLUX, ESOPHAGEAL 10/29/2006   Past Medical History  Diagnosis Date  . Hypertension   . Hypothyroidism   . Diverticulosis   . GERD (gastroesophageal reflux disease)     esophagitis with ulceration  . Osteopenia   . Bone spur     Left foot / posterior  . Menopausal symptoms     hot flashes  .  Arthritis   . Sleep apnea 2007    AHI 45/hr in 2007  . Hyperthyroidism    Past Surgical History  Procedure Laterality Date  . Abdominal hysterectomy  1983    partial/endometriosis  . Breast surgery  1970's    breast lumpectomy/benign  . Breast surgery  1994    breast implants/saline  . Thumb nodule      removed  . Esophagogastroduodenoscopy  03/2008    esophagitis with ulcerations/?barretts  . Tonsillectomy    . Dorsal compartment release  05/14/2011    Procedure: RELEASE DORSAL COMPARTMENT (DEQUERVAIN);  Surgeon: Cammie Sickle., MD;  Location: [facility];  Service: Orthopedics;  Laterality: Right;  right release dorsal compartment/ de quervain  release with debridement of mucoid cyst   Social History  Substance Use Topics  . Smoking status: Never Smoker   . Smokeless tobacco: Never Used  . Alcohol Use: No   Family History  Problem Relation Age of Onset  . Hypertension Mother   . Hyperlipidemia Mother   . Colon polyps Mother   . Heart disease Mother   . Migraines Mother   . Cancer Father     lung CA  . Migraines Brother   . Cancer Maternal Aunt     breast CA   Allergies  Allergen Reactions  . Benzonatate     Hot flash  . Dilaudid [Hydromorphone Hcl] Other (See Comments)    Face turned red; bp   Current Outpatient Prescriptions on File Prior to Visit  Medication Sig Dispense Refill  . levothyroxine (SYNTHROID, LEVOTHROID) 100 MCG tablet Take 1 tablet (100 mcg total) by mouth daily before breakfast. 90 tablet 3  . losartan (COZAAR) 50 MG tablet Take 1 tablet (50 mg total) by mouth daily. 30 tablet 11  . metFORMIN (GLUCOPHAGE-XR) 500 MG 24 hr tablet Take 2 tablets (1,000 mg total) by mouth daily with breakfast. 60 tablet 11  . Multiple Vitamin (MULTIVITAMIN) tablet Take 1 tablet by mouth daily.     No current facility-administered medications on file prior to visit.    Review of Systems Review of Systems  Constitutional: Negative for fever, appetite change, fatigue and unexpected weight change.  Eyes: Negative for pain and visual disturbance.  Respiratory: Negative for cough and shortness of breath.   Cardiovascular: Negative for cp or palpitations    Gastrointestinal: Negative for nausea, diarrhea and constipation.  Genitourinary: Negative for urgency and frequency.  Skin: Negative for pallor or rash   Neurological: Negative for weakness, light-headedness, numbness and headaches.  Hematological: Negative for adenopathy. Does not bruise/bleed easily.  Psychiatric/Behavioral: Negative for dysphoric mood. The patient is not nervous/anxious.         Objective:   Physical Exam  Constitutional: She appears  well-developed and well-nourished. No distress.  overwt and well app  HENT:  Head: Normocephalic and atraumatic.  Right Ear: External ear normal.  Left Ear: External ear normal.  Mouth/Throat: Oropharynx is clear and moist.  Eyes: Conjunctivae and EOM are normal. Pupils are equal, round, and reactive to light. No scleral icterus.  Neck: Normal range of motion. Neck supple. No JVD present. Carotid bruit is not present. No thyromegaly present.  Cardiovascular: Normal rate, regular rhythm, normal heart sounds and intact distal pulses.  Exam reveals no gallop.   Pulmonary/Chest: Effort normal and breath sounds normal. No respiratory distress. She has no wheezes. She exhibits no tenderness.  Abdominal: Soft. Bowel sounds are normal. She exhibits no distension, no abdominal bruit and  no mass. There is no tenderness.  Genitourinary: No breast swelling, tenderness, discharge or bleeding.  Musculoskeletal: Normal range of motion. She exhibits no edema or tenderness.  Lymphadenopathy:    She has no cervical adenopathy.  Neurological: She is alert. She has normal reflexes. No cranial nerve deficit. She exhibits normal muscle tone. Coordination normal.  Skin: Skin is warm and dry. No rash noted. No erythema. No pallor.  Lentigines and solar aging noted in sun exp areas    Psychiatric: She has a normal mood and affect.          Assessment & Plan:   Problem List Items Addressed This Visit      Cardiovascular and Mediastinum   Essential hypertension - Primary    bp in fair control at this time  BP Readings from Last 1 Encounters:  12/09/15 130/78   No changes needed Disc lifstyle change with low sodium diet and exercise  Labs reviewed         Digestive   REFLUX, ESOPHAGEAL    Doing ok off ppi  Watching diet and weight        Endocrine   Hypothyroidism due to Hashimoto's thyroiditis (Chronic)    Sees endocrinology Lab Results  Component Value Date   TSH 1.97 12/05/2015           Diabetes mellitus type 2, controlled, without complications ([place])    Well controlled metformin and diet and exercise Lab Results  Component Value Date   HGBA1C 6.4 12/05/2015   Enc wt loss and good habits  She will make her own eye appt this summer         Musculoskeletal and Integument   Osteopenia    Reviewed last dexa- stable  No falls or fx Adv to change mvi to ca plus D bid instead   Disc need for calcium/ vitamin D/ wt bearing exercise and bone density test every 2 y to monitor Disc safety/ fracture risk in detail          Other   Routine general medical examination at a health care facility    Reviewed health habits including diet and exercise and skin cancer prevention Reviewed appropriate screening tests for age  Also reviewed health mt list, fam hx and immunization status , as well as social and family history   See HPI Labs reviewed  Enc to make eye doctor appt this summer  Tdap vaccine today  Enc to start ca plus D instead of daily mvi  Enc exercise       PURE HYPERCHOLESTEROLEMIA    Not at goal but pt declines tx of any kind Disc goals for lipids and reasons to control them Rev labs with pt Rev low sat fat diet in detail Given handout on low cholesterol diet

## 2015-12-09 NOTE — Patient Instructions (Signed)
Don't forget to schedule your eye exam in Aug  Tdap vaccine today  Instead of a multi vitamin- I would rather you take calcium and D   (Try to get 1200-1500 mg of calcium per day with at least 1000 iu of vitamin D - for bone health) Keep exercising

## 2015-12-10 DIAGNOSIS — Z23 Encounter for immunization: Secondary | ICD-10-CM | POA: Diagnosis not present

## 2015-12-10 NOTE — Addendum Note (Signed)
Addended by: Tammi Sou on: 12/10/2015 08:34 AM   Modules accepted: Orders

## 2016-01-15 ENCOUNTER — Other Ambulatory Visit: Payer: BLUE CROSS/BLUE SHIELD

## 2016-01-16 ENCOUNTER — Other Ambulatory Visit (INDEPENDENT_AMBULATORY_CARE_PROVIDER_SITE_OTHER): Payer: BLUE CROSS/BLUE SHIELD

## 2016-01-16 ENCOUNTER — Telehealth: Payer: Self-pay

## 2016-01-16 DIAGNOSIS — E038 Other specified hypothyroidism: Secondary | ICD-10-CM

## 2016-01-16 DIAGNOSIS — E063 Autoimmune thyroiditis: Secondary | ICD-10-CM

## 2016-01-16 LAB — TSH: TSH: 2.02 u[IU]/mL (ref 0.35–4.50)

## 2016-01-16 LAB — T4, FREE: Free T4: 0.86 ng/dL (ref 0.60–1.60)

## 2016-01-16 NOTE — Telephone Encounter (Signed)
Called and spoke with patient about normal results. Patient had no questions or concerns.

## 2016-02-28 ENCOUNTER — Other Ambulatory Visit: Payer: Self-pay | Admitting: Internal Medicine

## 2016-03-07 LAB — HM DIABETES EYE EXAM

## 2016-03-10 ENCOUNTER — Telehealth: Payer: Self-pay

## 2016-03-10 NOTE — Telephone Encounter (Signed)
Pt left v/m wanting to know if Dr Glori Bickers suggested the stronger flu shot for pts 70 years old. Pt request cb. There is a flu shot clinic on 03/11/16 and 03/13/16.

## 2016-03-10 NOTE — Telephone Encounter (Signed)
I don't recommend the higher dose vaccine because I have seen more side effects from it

## 2016-03-10 NOTE — Telephone Encounter (Signed)
Pt notified of Dr. Marliss Coots comments and flu shot appt scheduled

## 2016-03-11 ENCOUNTER — Ambulatory Visit (INDEPENDENT_AMBULATORY_CARE_PROVIDER_SITE_OTHER): Payer: BLUE CROSS/BLUE SHIELD

## 2016-03-11 DIAGNOSIS — Z23 Encounter for immunization: Secondary | ICD-10-CM

## 2016-04-02 ENCOUNTER — Telehealth: Payer: Self-pay | Admitting: Internal Medicine

## 2016-04-02 MED ORDER — LEVOTHYROXINE SODIUM 100 MCG PO TABS: ORAL_TABLET | ORAL | 0 refills | Status: DC

## 2016-04-02 NOTE — Telephone Encounter (Signed)
Refill submitted, pending patient's appointment on 04/28/2016.

## 2016-04-02 NOTE — Telephone Encounter (Signed)
Pt called and said she needs enough of her Levothyroxine 100 MCG refilled until her appointment (04/28/16) [facility].

## 2016-04-28 ENCOUNTER — Ambulatory Visit (INDEPENDENT_AMBULATORY_CARE_PROVIDER_SITE_OTHER): Payer: BLUE CROSS/BLUE SHIELD | Admitting: Internal Medicine

## 2016-04-28 VITALS — BP 128/88 | HR 84 | Ht 65.0 in | Wt 184.0 lb

## 2016-04-28 DIAGNOSIS — E038 Other specified hypothyroidism: Secondary | ICD-10-CM

## 2016-04-28 DIAGNOSIS — E063 Autoimmune thyroiditis: Secondary | ICD-10-CM | POA: Diagnosis not present

## 2016-04-28 LAB — T4, FREE: FREE T4: 0.88 ng/dL (ref 0.60–1.60)

## 2016-04-28 LAB — TSH: TSH: 1.52 u[IU]/mL (ref 0.35–4.50)

## 2016-04-28 NOTE — Progress Notes (Signed)
Patient ID: Rebecca Kramer, female   DOB: [birth year], 70 y.o.   MRN: [id number]    HPI  Rebecca Kramer is a 70 y.o.-year-old female, initially referred by her PCP, Dr.Tower, returning for f/u for Hashimoto hypothyroidism. She was seeing Dr Sharol Roussel [facility]) >> stopped seeing her 2/2 cost. Last visit 1 year and 3 mo ago.  Pt. has been dx with hypothyroidism "many years ago"; was on: - generic LT4  - Synthroid DAW - Naturethroid 81.5 mg + Cytomel 5 mg bid (equivalent to ~180 mcg LT4) >> stopped 6 mo ago She is now on Levothyroxine 100 mcg daily.  She takes the thyroid hh: - at bedtime (prefers this) - with water - separated by ~4h from dinner - + calcium, multivitamins - in am - off PPIs  - no iron   I reviewed pt's thyroid tests: Lab Results  Component Value Date   TSH 2.02 01/16/2016   TSH 1.97 12/05/2015   TSH 1.27 11/19/2015   TSH 0.43 01/10/2015   TSH 1.12 11/26/2014   TSH 0.79 07/23/2014   TSH 1.04 05/08/2014   TSH 0.12 (L) 10/07/2012   TSH 3.56 07/17/2011   TSH 5.07 11/06/2009   FREET4 0.86 01/16/2016   FREET4 1.02 11/19/2015   FREET4 1.33 01/10/2015   FREET4 1.23 07/23/2014   FREET4 1.02 05/08/2014   FREET4 1.4 10/08/2008   03/14/2014: ATA 313 (<2) TPO Abs 39 (<9) TSH 3.552 (0.35-4.5), free T4 0.82 (0.8-1.8), Free T3 2.6 (2.3-4.2), reverse T3 12 (8-25) At the same time, she hadn't hemoglobin A1c 6.3, fasting insulin 16.6, vitamin D 66  Pt describes: - no weight gain or loss - no fatigue  - no cold intolerance, no hot flashes - no depression - no constipation - no dry skin - no hair loss  Pt denies feeling nodules in neck, hoarseness, no dysphagia/no odynophagia, SOB with lying down.  Exercises at [employer] 3 times a week.  ROS: Constitutional: + see HPI Eyes: + blurry vision, no xerophthalmia ENT: no sore throat, no nodules palpated in throat, no dysphagia/odynophagia, no hoarseness Cardiovascular: no CP/SOB/palpitations/leg  swelling Respiratory: + cough/no SOB Gastrointestinal: no N/V/D/C, no acid reflux  Musculoskeletal: no muscle/joint aches Skin: no rashes, no hair loss Neurological: no tremors/numbness/tingling/dizziness  I reviewed pt's medications, allergies, PMH, social hx, family hx, and changes were documented in the history of present illness. Otherwise, unchanged from my initial visit note.  Past Medical History:  Diagnosis Date  . Arthritis   . Bone spur    Left foot / posterior  . Diverticulosis   . GERD (gastroesophageal reflux disease)    esophagitis with ulceration  . Hypertension   . Hyperthyroidism   . Hypothyroidism   . Menopausal symptoms    hot flashes  . Osteopenia   . Sleep apnea 2007   AHI 45/hr in 2007   Past Surgical History:  Procedure Laterality Date  . ABDOMINAL HYSTERECTOMY  1983   partial/endometriosis  . BREAST SURGERY  1970's   breast lumpectomy/benign  . BREAST SURGERY  1994   breast implants/saline  . DORSAL COMPARTMENT RELEASE  05/14/2011   Procedure: RELEASE DORSAL COMPARTMENT (DEQUERVAIN);  Surgeon: Cammie Sickle., MD;  Location: [facility];  Service: Orthopedics;  Laterality: Right;  right release dorsal compartment/ de quervain release with debridement of mucoid cyst  . ESOPHAGOGASTRODUODENOSCOPY  03/2008   esophagitis with ulcerations/?barretts  . thumb nodule     removed  . TONSILLECTOMY  History   Social History  . Marital Status: Married    Spouse Name: N/A    Number of Children: 2   Occupational History  . retired   Social History Main Topics  . Smoking status: Never Smoker   . Smokeless tobacco: Never Used  . Alcohol Use: No  . Drug Use: No   Current Outpatient Prescriptions on File Prior to Visit  Medication Sig Dispense Refill  . levothyroxine (SYNTHROID, LEVOTHROID) 100 MCG tablet Take 1 tablet (100 mcg total) by mouth daily before breakfast. 90 tablet 3  . losartan (COZAAR) 50 MG tablet Take 1 tablet (50  mg total) by mouth daily. 30 tablet 11  . metFORMIN (GLUCOPHAGE-XR) 500 MG 24 hr tablet Take 2 tablets (1,000 mg total) by mouth daily with breakfast. 60 tablet 11  . Multiple Vitamin (MULTIVITAMIN) tablet Take 1 tablet by mouth daily.     No current facility-administered medications on file prior to visit.    Allergies  Allergen Reactions  . Benzonatate     Hot flash  . Dilaudid [Hydromorphone Hcl] Other (See Comments)    Face turned red; bp   Family History  Problem Relation Age of Onset  . Hypertension Mother   . Hyperlipidemia Mother   . Colon polyps Mother   . Heart disease Mother   . Migraines Mother   . Cancer Father     lung CA  . Migraines Brother   . Cancer Maternal Aunt     breast CA   PE: BP 128/88 (BP Location: Left Arm, Patient Position: Sitting)   Pulse 84   Ht 5\' 5"  (1.651 m)   Wt 184 lb (83.5 kg)   SpO2 97%   BMI 30.62 kg/m  Wt Readings from Last 3 Encounters:  04/28/16 184 lb (83.5 kg)  12/09/15 182 lb 8 oz (82.8 kg)  11/04/15 186 lb (84.4 kg)   Constitutional: overweight, in NAD Eyes: PERRLA, EOMI, no exophthalmos ENT: moist mucous membranes, no thyromegaly, no cervical lymphadenopathy Cardiovascular: RRR, No MRG Respiratory: CTA B Gastrointestinal: abdomen soft, NT, ND, BS+ Musculoskeletal: no deformities, strength intact in all 4 Skin: moist, warm, no rashes Neurological: no tremor with outstretched hands, DTR normal in all 4  ASSESSMENT: 1. Hypothyroidism - Hashimoto's thyroiditis  PLAN:  1. Patient with long-standing hypothyroidism, on levothyroxine therapy. She appears euthyroid. She does not appear to have a goiter, thyroid nodules, or neck compression symptoms.  - she is now on generic LT4 100 mcg >> feels well - she moved her LT4 dose at night since last visit as she prefers this >> thyroid labs checked after the change >> normal - We discussed about correct intake of levothyroxine, on an empty stomach, with water, separated by more  than 4 hours from calcium, iron, multivitamins, acid reflux medications (PPIs). - will check thyroid tests today - RTC in 1 year   Needs refills.  Office Visit on 04/28/2016  Component Date Value Ref Range Status  . Free T4 04/28/2016 0.88  0.60 - 1.60 ng/dL Final   Comment: Specimens from patients who are undergoing biotin therapy and /or ingesting biotin supplements may contain high levels of biotin.  The higher biotin concentration in these specimens interferes with this Free T4 assay.  Specimens that contain high levels  of biotin may cause false high results for this Free T4 assay.  Please interpret results in light of the total clinical presentation of the patient.    Marland Kitchen TSH 04/28/2016 1.52  0.35 -  4.50 uIU/mL Final   Normal labs.   Philemon Kingdom, MD PhD [facility]

## 2016-04-28 NOTE — Patient Instructions (Signed)
Please stop at the lab.  Please continue Levothyroxine 100 mcg daily.  Take the thyroid hormone every day, with water, on an empty stomach, separated by at least 4 hours from: - acid reflux medications - calcium - iron - multivitamins  Please return in 1 year.

## 2016-04-29 ENCOUNTER — Telehealth: Payer: Self-pay

## 2016-04-29 MED ORDER — LEVOTHYROXINE SODIUM 100 MCG PO TABS: 100.0000 ug | ORAL_TABLET | Freq: Every day | ORAL | 3 refills | Status: DC

## 2016-04-29 NOTE — Telephone Encounter (Signed)
Called patient and advised of lab results. Patient requested refills which I sent into pharmacy. No other questions at this time.

## 2016-05-06 ENCOUNTER — Encounter: Payer: Self-pay | Admitting: Internal Medicine

## 2016-05-18 ENCOUNTER — Other Ambulatory Visit: Payer: Self-pay | Admitting: *Deleted

## 2016-05-18 MED ORDER — METFORMIN HCL ER 500 MG PO TB24: 1000.0000 mg | ORAL_TABLET | Freq: Every day | ORAL | 1 refills | Status: DC

## 2016-05-18 MED ORDER — LOSARTAN POTASSIUM 50 MG PO TABS: 50.0000 mg | ORAL_TABLET | Freq: Every day | ORAL | 1 refills | Status: AC

## 2016-06-01 ENCOUNTER — Other Ambulatory Visit: Payer: Self-pay

## 2016-06-01 MED ORDER — LEVOTHYROXINE SODIUM 100 MCG PO TABS: 100.0000 ug | ORAL_TABLET | Freq: Every day | ORAL | 3 refills | Status: DC

## 2016-06-10 ENCOUNTER — Other Ambulatory Visit: Payer: Self-pay | Admitting: Family Medicine

## 2016-06-10 DIAGNOSIS — Z1231 Encounter for screening mammogram for malignant neoplasm of breast: Secondary | ICD-10-CM

## 2016-07-22 ENCOUNTER — Ambulatory Visit: Payer: BLUE CROSS/BLUE SHIELD

## 2016-08-14 ENCOUNTER — Telehealth: Payer: Self-pay

## 2016-08-14 NOTE — Telephone Encounter (Signed)
Pt left v/m requesting cb with questions about diabetes. Pt last saw Dr Cruzita Lederer on 04/28/16. I called pt back no answer and no v/m. Will try again another time.

## 2016-08-18 ENCOUNTER — Ambulatory Visit
Admission: RE | Admit: 2016-08-18 | Discharge: 2016-08-18 | Disposition: A | Discharge status: Home / Self Care | Payer: BLUE CROSS/BLUE SHIELD | Source: Ambulatory Visit | Attending: Family Medicine | Admitting: Family Medicine

## 2016-08-18 DIAGNOSIS — Z1231 Encounter for screening mammogram for malignant neoplasm of breast: Secondary | ICD-10-CM

## 2016-08-18 LAB — HM MAMMOGRAPHY

## 2016-08-20 ENCOUNTER — Encounter: Payer: Self-pay | Admitting: *Deleted

## 2016-08-21 NOTE — Telephone Encounter (Signed)
Pt left v/m requesting cb about diabetes. Left v/m requesting cb.

## 2016-08-24 NOTE — Telephone Encounter (Signed)
Pt is concerned because she went to DM education call at [facility] recently and talking to them she said some people are having f/u for their DM every 3 months or every 6 months with their PCP and she is only seeing you yearly. Pt said that makes her very concerned because she has no idea what to look for if her DM worsens, pt said she doesn't check her blood sugar at all so the only time she has labs to check her DM is at her yearly physicals. Pt wanted to know if Dr. Cruzita Lederer should start managing her DM if not she is questioning if she needs to been seen sooner for a f/u and lab appt because she said she is worried only having labs done yearly and she doesn't know what sxs are an indication that her DM is worsening, please advise

## 2016-08-24 NOTE — Telephone Encounter (Signed)
Pt called to discuss her diabetes care.  She has been playing phone tag with Mearl Latin but she is out today.  Can you please call her and see if you can address her concerns. Thanks!

## 2016-08-24 NOTE — Telephone Encounter (Signed)
Pt said she went for a meeting at [facility]

## 2016-08-24 NOTE — Telephone Encounter (Signed)
Pt notified of Dr. Marliss Coots comments and f/u appt scheduled with lab appt prior. She wants Dr. Glori Bickers to continue managing her DM

## 2016-08-24 NOTE — Telephone Encounter (Signed)
For well controlled diabetes-we should be seeing her every 6 months with labs (not just once per year) - so make an appt for a visit with labs prior  If she prefer Dr Cruzita Lederer treat her diabetes-that is fine as well -she can decide

## 2016-08-24 NOTE — Telephone Encounter (Signed)
Please check in with her

## 2016-08-27 ENCOUNTER — Telehealth: Payer: Self-pay | Admitting: Family Medicine

## 2016-08-27 DIAGNOSIS — E119 Type 2 diabetes mellitus without complications: Secondary | ICD-10-CM

## 2016-08-27 DIAGNOSIS — E7800 Pure hypercholesterolemia, unspecified: Secondary | ICD-10-CM

## 2016-08-27 DIAGNOSIS — I10 Essential (primary) hypertension: Secondary | ICD-10-CM

## 2016-08-27 NOTE — Telephone Encounter (Signed)
-----   Message from Ellamae Sia sent at 08/25/2016 10:31 AM EST ----- Regarding: Lab orders for Friday, 2.23.18 Lab orders for a DM f/u

## 2016-08-28 ENCOUNTER — Other Ambulatory Visit (INDEPENDENT_AMBULATORY_CARE_PROVIDER_SITE_OTHER): Payer: BLUE CROSS/BLUE SHIELD

## 2016-08-28 DIAGNOSIS — I10 Essential (primary) hypertension: Secondary | ICD-10-CM | POA: Diagnosis not present

## 2016-08-28 DIAGNOSIS — E119 Type 2 diabetes mellitus without complications: Secondary | ICD-10-CM

## 2016-08-28 DIAGNOSIS — E7800 Pure hypercholesterolemia, unspecified: Secondary | ICD-10-CM | POA: Diagnosis not present

## 2016-08-28 LAB — LIPID PANEL
CHOLESTEROL: 195 mg/dL (ref 0–200)
HDL: 51.3 mg/dL (ref >39.00)
LDL CALC: 118 mg/dL — AB (ref 0–99)
NonHDL: 143.91
TRIGLYCERIDES: 130 mg/dL (ref 0.0–149.0)
Total CHOL/HDL Ratio: 4
VLDL: 26 mg/dL (ref 0.0–40.0)

## 2016-08-28 LAB — BASIC METABOLIC PANEL
BUN: 20 mg/dL (ref 6–23)
CHLORIDE: 105 meq/L (ref 96–112)
CO2: 29 meq/L (ref 19–32)
CREATININE: 0.83 mg/dL (ref 0.40–1.20)
Calcium: 9.2 mg/dL (ref 8.4–10.5)
GFR: 72.08 mL/min (ref >60.00)
Glucose, Bld: 117 mg/dL — ABNORMAL HIGH (ref 70–99)
Potassium: 4.4 mEq/L (ref 3.5–5.1)
Sodium: 140 mEq/L (ref 135–145)

## 2016-08-28 LAB — HEMOGLOBIN A1C: Hgb A1c MFr Bld: 6.5 % (ref 4.6–6.5)

## 2016-09-02 ENCOUNTER — Encounter: Payer: Self-pay | Admitting: Family Medicine

## 2016-09-02 ENCOUNTER — Ambulatory Visit (INDEPENDENT_AMBULATORY_CARE_PROVIDER_SITE_OTHER): Payer: BLUE CROSS/BLUE SHIELD | Admitting: Family Medicine

## 2016-09-02 VITALS — BP 136/92 | HR 80 | Temp 98.6°F | Ht 65.0 in | Wt 180.2 lb

## 2016-09-02 DIAGNOSIS — I10 Essential (primary) hypertension: Secondary | ICD-10-CM

## 2016-09-02 DIAGNOSIS — E7800 Pure hypercholesterolemia, unspecified: Secondary | ICD-10-CM | POA: Diagnosis not present

## 2016-09-02 DIAGNOSIS — E669 Obesity, unspecified: Secondary | ICD-10-CM | POA: Diagnosis not present

## 2016-09-02 DIAGNOSIS — K219 Gastro-esophageal reflux disease without esophagitis: Secondary | ICD-10-CM

## 2016-09-02 DIAGNOSIS — E119 Type 2 diabetes mellitus without complications: Secondary | ICD-10-CM

## 2016-09-02 NOTE — Assessment & Plan Note (Signed)
Discussed how this problem influences overall health and the risks it imposes  Reviewed plan for weight loss with lower calorie diet (via better food choices and also portion control or program like weight watchers) and exercise building up to or more than 30 minutes 5 days per week including some aerobic activity   Despite reduced calorie diet -has not lost much more wt Disc keeping up the good work with healthy diet and exercise to loose or mt

## 2016-09-02 NOTE — Assessment & Plan Note (Signed)
Not adequately controlled on losartan 25- she will try 50 again and if side eff start -call  Would consider low dose losartan with hct in that instance Disc DASH eating

## 2016-09-02 NOTE — Assessment & Plan Note (Signed)
Lab Results  Component Value Date   HGBA1C 6.5 08/28/2016   Fairly controlled with diet/exercise and metformin XR Disc goals for bp and cholesterol F/u in aug as planned Continue wt loss efforts

## 2016-09-02 NOTE — Assessment & Plan Note (Signed)
Disc goals for lipids and reasons to control them Rev labs with pt Rev low sat fat diet in detail LDL not at goal for dm  Good HDL  She declines medication of any kind at this point Will continue to follow

## 2016-09-02 NOTE — Progress Notes (Signed)
Subjective:    Patient ID: Rebecca Kramer, female    DOB: 1945/08/14, 71 y.o.   MRN: [id number]  HPI Here for f/u of DM2 and chronic health problems    Wt Readings from Last 3 Encounters:  09/02/16 180 lb 4 oz (81.8 kg)  04/28/16 184 lb (83.5 kg)  12/09/15 182 lb 8 oz (82.8 kg)  bmi of 30    Exercise - she goes to [employer] 3 times per week and walks 3 miles 3 days per week   Diet - lately has tried 1000 cal per day diet and better choices  Not loosing weight  Good knowledge base -did [place] DM ed  She is very frustrated     bp is stable today - she cut her 50 mg in 1/2 (so she is on 25)  She felt bad and light headed on 100 mg  No cp or palpitations or headaches or edema  No side effects to medicines  BP Readings from Last 3 Encounters:  09/02/16 (!) 136/92  04/28/16 128/88  12/09/15 130/78     Diabetes Of note her brother has diabetes -is getting worse  Home sugar results  DM diet - very good  Exercise -very good  Symptoms- none  A1C last  Lab Results  Component Value Date   HGBA1C 6.5 08/28/2016   Last was 6.4  No problems with medications -metformin XR Renal protection-ARB Last eye exam    Hx of hyperlipidemia Lab Results  Component Value Date   CHOL 195 08/28/2016   CHOL 210 (H) 12/05/2015   CHOL 222 (H) 08/27/2015   Lab Results  Component Value Date   HDL 51.30 08/28/2016   HDL 53.50 12/05/2015   HDL 59.30 08/27/2015   Lab Results  Component Value Date   LDLCALC 118 (H) 08/28/2016   LDLCALC 132 (H) 12/05/2015   LDLCALC 138 (H) 08/27/2015   Lab Results  Component Value Date   TRIG 130.0 08/28/2016   TRIG 125.0 12/05/2015   TRIG 125.0 08/27/2015   Lab Results  Component Value Date   CHOLHDL 4 08/28/2016   CHOLHDL 4 12/05/2015   CHOLHDL 4 08/27/2015   Lab Results  Component Value Date   LDLDIRECT 129.6 07/17/2011   LDLDIRECT 149.8 11/06/2009   Declines medicine of any kind  Genetically high- mother and other family     Eats lean meat  Eats fish (with fins)   Patient Active Problem List   Diagnosis Date Noted  . Hypersomnia with sleep apnea 11/04/2015  . Acute eczematoid otitis externa of both ears 12/04/2014  . Routine general medical examination at a health care facility 11/30/2014  . Diabetes mellitus type 2, controlled, without complications ([place]) 0000000  . Obstructive sleep apnea 02/27/2010  . PURE HYPERCHOLESTEROLEMIA 02/05/2010  . ECZEMA 11/07/2009  . INFLAMED SEBORRHEIC KERATOSIS 10/29/2008  . Osteopenia 10/08/2008  . Hypothyroidism due to Hashimoto's thyroiditis 10/29/2006  . Essential hypertension 10/29/2006  . REFLUX, ESOPHAGEAL 10/29/2006   Past Medical History:  Diagnosis Date  . Arthritis   . Bone spur    Left foot / posterior  . Diverticulosis   . GERD (gastroesophageal reflux disease)    esophagitis with ulceration  . Hypertension   . Hyperthyroidism   . Hypothyroidism   . Menopausal symptoms    hot flashes  . Osteopenia   . Sleep apnea 2007   AHI 45/hr in 2007   Past Surgical History:  Procedure Laterality Date  . [place]  partial/endometriosis  . BREAST SURGERY  1970's   breast lumpectomy/benign  . BREAST SURGERY  1994   breast implants/saline  . DORSAL COMPARTMENT RELEASE  05/14/2011   Procedure: RELEASE DORSAL COMPARTMENT (DEQUERVAIN);  Surgeon: Cammie Sickle., MD;  Location: [facility];  Service: Orthopedics;  Laterality: Right;  right release dorsal compartment/ de quervain release with debridement of mucoid cyst  . ESOPHAGOGASTRODUODENOSCOPY  03/2008   esophagitis with ulcerations/?barretts  . thumb nodule     removed  . TONSILLECTOMY     Social History  Substance Use Topics  . Smoking status: Never Smoker  . Smokeless tobacco: Never Used  . Alcohol use No   Family History  Problem Relation Age of Onset  . Hypertension Mother   . Hyperlipidemia Mother   . Colon polyps Mother   . Heart disease Mother    . Migraines Mother   . Cancer Father     lung CA  . Migraines Brother   . Cancer Maternal Aunt     breast CA  . Breast cancer Maternal Aunt    Allergies  Allergen Reactions  . Benzonatate     Hot flash  . Dilaudid [Hydromorphone Hcl] Other (See Comments)    Face turned red; bp   Current Outpatient Prescriptions on File Prior to Visit  Medication Sig Dispense Refill  . levothyroxine (SYNTHROID, LEVOTHROID) 100 MCG tablet Take 1 tablet (100 mcg total) by mouth daily before breakfast. 90 tablet 3  . losartan (COZAAR) 50 MG tablet Take 1 tablet (50 mg total) by mouth daily. 90 tablet 1  . metFORMIN (GLUCOPHAGE-XR) 500 MG 24 hr tablet Take 2 tablets (1,000 mg total) by mouth daily with breakfast. 180 tablet 1  . Multiple Vitamin (MULTIVITAMIN) tablet Take 1 tablet by mouth daily.     No current facility-administered medications on file prior to visit.     Review of Systems Review of Systems  Constitutional: Negative for fever, appetite change, fatigue and unexpected weight change.  Eyes: Negative for pain and visual disturbance.  Respiratory: Negative for cough and shortness of breath.   Cardiovascular: Negative for cp or palpitations    Gastrointestinal: Negative for nausea, diarrhea and constipation.  Genitourinary: Negative for urgency and frequency. neg for thirst  Skin: Negative for pallor or rash   Neurological: Negative for weakness, light-headedness, numbness and headaches.  Hematological: Negative for adenopathy. Does not bruise/bleed easily.  Psychiatric/Behavioral: Negative for dysphoric mood. The patient is not nervous/anxious.         Objective:   Physical Exam  Constitutional: She appears well-developed and well-nourished. No distress.  obese and well appearing   HENT:  Head: Normocephalic and atraumatic.  Mouth/Throat: Oropharynx is clear and moist.  Eyes: Conjunctivae and EOM are normal. Pupils are equal, round, and reactive to light.  Neck: Normal range  of motion. Neck supple. No JVD present. Carotid bruit is not present. No thyromegaly present.  Cardiovascular: Normal rate, regular rhythm, normal heart sounds and intact distal pulses.  Exam reveals no gallop.   Pulmonary/Chest: Effort normal and breath sounds normal. No respiratory distress. She has no wheezes. She has no rales.  No crackles  Abdominal: Soft. Bowel sounds are normal. She exhibits no distension, no abdominal bruit and no mass. There is no tenderness.  Musculoskeletal: She exhibits no edema.  Lymphadenopathy:    She has no cervical adenopathy.  Neurological: She is alert. She has normal reflexes. No cranial nerve deficit. She exhibits normal muscle tone. Coordination normal.  Skin: Skin is warm and dry. No rash noted. No pallor.  Psychiatric: She has a normal mood and affect.          Assessment & Plan:   Problem List Items Addressed This Visit      Cardiovascular and Mediastinum   Essential hypertension - Primary    Not adequately controlled on losartan 25- she will try 50 again and if side eff start -call  Would consider low dose losartan with hct in that instance Disc DASH eating         Digestive   REFLUX, ESOPHAGEAL    Now doing well off medication        Endocrine   Diabetes mellitus type 2, controlled, without complications ([place])    Lab Results  Component Value Date   HGBA1C 6.5 08/28/2016   Fairly controlled with diet/exercise and metformin XR Disc goals for bp and cholesterol F/u in aug as planned Continue wt loss efforts         Other   Obesity (BMI 30.0-34.9)    Discussed how this problem influences overall health and the risks it imposes  Reviewed plan for weight loss with lower calorie diet (via better food choices and also portion control or program like weight watchers) and exercise building up to or more than 30 minutes 5 days per week including some aerobic activity   Despite reduced calorie diet -has not lost much more wt Disc  keeping up the good work with healthy diet and exercise to loose or mt      PURE HYPERCHOLESTEROLEMIA    Disc goals for lipids and reasons to control them Rev labs with pt Rev low sat fat diet in detail LDL not at goal for dm  Good HDL  She declines medication of any kind at this point Will continue to follow

## 2016-09-02 NOTE — Progress Notes (Signed)
Pre visit review using our clinic review tool, if applicable. No additional management support is needed unless otherwise documented below in the visit note. 

## 2016-09-02 NOTE — Assessment & Plan Note (Signed)
Now doing well off medication

## 2016-09-02 NOTE — Patient Instructions (Addendum)
Diabetes is in good control  Cholesterol is not at goal-if you change your mind about cholesterol medicine please let me know  Blood pressure is not adequate - go up to 50 mg of losartan - if side effects return then stop it and let me know and we will try a lower dose of losartan with hctz   Keep up the great diet and exercise   If all is well we will see you in Aug

## 2017-02-08 ENCOUNTER — Other Ambulatory Visit: Payer: BLUE CROSS/BLUE SHIELD

## 2017-02-10 ENCOUNTER — Encounter: Payer: BLUE CROSS/BLUE SHIELD | Admitting: Family Medicine

## 2017-04-06 DIAGNOSIS — G4733 Obstructive sleep apnea (adult) (pediatric): Secondary | ICD-10-CM | POA: Diagnosis not present

## 2017-04-19 ENCOUNTER — Encounter: Payer: Self-pay | Admitting: *Deleted

## 2017-04-20 ENCOUNTER — Ambulatory Visit: Payer: BLUE CROSS/BLUE SHIELD | Admitting: Anesthesiology

## 2017-04-20 ENCOUNTER — Encounter: Payer: Self-pay | Admitting: *Deleted

## 2017-04-20 ENCOUNTER — Encounter
Admission: AD | Disposition: A | Discharge status: Home / Self Care | Payer: Self-pay | Source: Ambulatory Visit | Attending: Gastroenterology

## 2017-04-20 ENCOUNTER — Ambulatory Visit
Admission: AD | Admit: 2017-04-20 | Discharge: 2017-04-20 | Disposition: A | Discharge status: Home / Self Care | Payer: BLUE CROSS/BLUE SHIELD | Source: Ambulatory Visit | Attending: Gastroenterology | Admitting: Gastroenterology

## 2017-04-20 DIAGNOSIS — Z79899 Other long term (current) drug therapy: Secondary | ICD-10-CM | POA: Diagnosis not present

## 2017-04-20 DIAGNOSIS — K2970 Gastritis, unspecified, without bleeding: Secondary | ICD-10-CM | POA: Insufficient documentation

## 2017-04-20 DIAGNOSIS — K208 Other esophagitis: Secondary | ICD-10-CM | POA: Diagnosis not present

## 2017-04-20 DIAGNOSIS — I10 Essential (primary) hypertension: Secondary | ICD-10-CM | POA: Insufficient documentation

## 2017-04-20 DIAGNOSIS — Z1211 Encounter for screening for malignant neoplasm of colon: Secondary | ICD-10-CM | POA: Diagnosis not present

## 2017-04-20 DIAGNOSIS — E119 Type 2 diabetes mellitus without complications: Secondary | ICD-10-CM | POA: Diagnosis not present

## 2017-04-20 DIAGNOSIS — K317 Polyp of stomach and duodenum: Secondary | ICD-10-CM | POA: Diagnosis not present

## 2017-04-20 DIAGNOSIS — D131 Benign neoplasm of stomach: Secondary | ICD-10-CM | POA: Diagnosis not present

## 2017-04-20 DIAGNOSIS — K210 Gastro-esophageal reflux disease with esophagitis: Secondary | ICD-10-CM | POA: Diagnosis not present

## 2017-04-20 DIAGNOSIS — K2950 Unspecified chronic gastritis without bleeding: Secondary | ICD-10-CM | POA: Diagnosis not present

## 2017-04-20 DIAGNOSIS — K449 Diaphragmatic hernia without obstruction or gangrene: Secondary | ICD-10-CM | POA: Diagnosis not present

## 2017-04-20 DIAGNOSIS — K2210 Ulcer of esophagus without bleeding: Secondary | ICD-10-CM | POA: Insufficient documentation

## 2017-04-20 DIAGNOSIS — Z9989 Dependence on other enabling machines and devices: Secondary | ICD-10-CM | POA: Insufficient documentation

## 2017-04-20 DIAGNOSIS — G4730 Sleep apnea, unspecified: Secondary | ICD-10-CM | POA: Insufficient documentation

## 2017-04-20 DIAGNOSIS — K209 Esophagitis, unspecified: Secondary | ICD-10-CM | POA: Diagnosis not present

## 2017-04-20 DIAGNOSIS — Z7984 Long term (current) use of oral hypoglycemic drugs: Secondary | ICD-10-CM | POA: Diagnosis not present

## 2017-04-20 DIAGNOSIS — E039 Hypothyroidism, unspecified: Secondary | ICD-10-CM | POA: Insufficient documentation

## 2017-04-20 DIAGNOSIS — K219 Gastro-esophageal reflux disease without esophagitis: Secondary | ICD-10-CM | POA: Diagnosis not present

## 2017-04-20 DIAGNOSIS — Z8371 Family history of colonic polyps: Secondary | ICD-10-CM | POA: Diagnosis not present

## 2017-04-20 HISTORY — PX: COLONOSCOPY WITH PROPOFOL: SHX5780

## 2017-04-20 HISTORY — DX: Type 2 diabetes mellitus without complications: E11.9

## 2017-04-20 HISTORY — PX: ESOPHAGOGASTRODUODENOSCOPY (EGD) WITH PROPOFOL: SHX5813

## 2017-04-20 HISTORY — DX: Barrett's esophagus without dysplasia: K22.70

## 2017-04-20 LAB — GLUCOSE, CAPILLARY: Glucose-Capillary: 127 mg/dL — ABNORMAL HIGH (ref 65–99)

## 2017-04-20 SURGERY — COLONOSCOPY WITH PROPOFOL
Anesthesia: General

## 2017-04-20 MED ORDER — PROPOFOL 500 MG/50ML IV EMUL
INTRAVENOUS | Status: AC
  Filled 2017-04-20: qty 50

## 2017-04-20 MED ORDER — SODIUM CHLORIDE 0.9 % IV SOLN: INTRAVENOUS | Status: DC

## 2017-04-20 MED ORDER — PROPOFOL 10 MG/ML IV BOLUS
INTRAVENOUS | Status: AC
  Filled 2017-04-20: qty 20

## 2017-04-20 MED ORDER — LIDOCAINE HCL (PF) 2 % IJ SOLN
INTRAMUSCULAR | Status: AC
  Filled 2017-04-20: qty 10

## 2017-04-20 MED ORDER — FENTANYL CITRATE (PF) 100 MCG/2ML IJ SOLN
INTRAMUSCULAR | Status: AC
  Filled 2017-04-20: qty 2

## 2017-04-20 MED ORDER — SODIUM CHLORIDE 0.9 % IV SOLN
INTRAVENOUS | Status: DC
  Administered 2017-04-20 (×2): via INTRAVENOUS

## 2017-04-20 MED ORDER — PHENYLEPHRINE HCL 10 MG/ML IJ SOLN
INTRAMUSCULAR | Status: DC | PRN
  Administered 2017-04-20 (×3): 100 ug via INTRAVENOUS

## 2017-04-20 MED ORDER — PROPOFOL 500 MG/50ML IV EMUL
INTRAVENOUS | Status: DC | PRN
  Administered 2017-04-20: 150 ug/kg/min via INTRAVENOUS

## 2017-04-20 MED ORDER — PROPOFOL 10 MG/ML IV BOLUS
INTRAVENOUS | Status: DC | PRN
  Administered 2017-04-20: 100 mg via INTRAVENOUS

## 2017-04-20 MED ORDER — FENTANYL CITRATE (PF) 100 MCG/2ML IJ SOLN
INTRAMUSCULAR | Status: DC | PRN
  Administered 2017-04-20 (×2): 50 ug via INTRAVENOUS

## 2017-04-20 MED ORDER — LIDOCAINE 2% (20 MG/ML) 5 ML SYRINGE
INTRAMUSCULAR | Status: DC | PRN
  Administered 2017-04-20: 40 mg via INTRAVENOUS

## 2017-04-20 NOTE — Anesthesia Preprocedure Evaluation (Signed)
Anesthesia Evaluation  Patient identified by MRN, date of birth, ID band Patient awake    Reviewed: Allergy & Precautions, H&P , NPO status , Patient's Chart, lab work & pertinent test results  History of Anesthesia Complications Negative for: history of anesthetic complications  Airway Mallampati: III  TM Distance: >3 FB Neck ROM: limited    Dental  (+) Chipped   Pulmonary neg shortness of breath, sleep apnea and Continuous Positive Airway Pressure Ventilation ,           Cardiovascular Exercise Tolerance: Good hypertension, (-) angina(-) Past MI and (-) DOE      Neuro/Psych negative neurological ROS  negative psych ROS   GI/Hepatic Neg liver ROS, GERD  ,  Endo/Other  diabetes, Type 2Hypothyroidism   Renal/GU negative Renal ROS  negative genitourinary   Musculoskeletal  (+) Arthritis ,   Abdominal   Peds  Hematology negative hematology ROS (+)   Anesthesia Other Findings Past Medical History: No date: Arthritis No date: Barrett's esophagus No date: Bone spur     Comment:  Left foot / posterior No date: Diabetes mellitus without complication (HCC) No date: Diverticulosis No date: GERD (gastroesophageal reflux disease)     Comment:  esophagitis with ulceration No date: Hypertension No date: Hypothyroidism No date: Menopausal symptoms     Comment:  hot flashes No date: Osteopenia 2007: Sleep apnea     Comment:  AHI 45/hr in 2007  Past Surgical History: 1983: ABDOMINAL HYSTERECTOMY     Comment:  partial/endometriosis 1970's: BREAST SURGERY     Comment:  breast lumpectomy/benign 1994: BREAST SURGERY     Comment:  breast implants/saline 05/14/2011: DORSAL COMPARTMENT RELEASE     Comment:  Procedure: RELEASE DORSAL COMPARTMENT (DEQUERVAIN);                Surgeon: Cammie Sickle., MD;  Location: [facility];  Service: Orthopedics;  Laterality:               Right;  right  release dorsal compartment/ de quervain               release with debridement of mucoid cyst 03/2008: ESOPHAGOGASTRODUODENOSCOPY     Comment:  esophagitis with ulcerations/?barretts No date: thumb nodule     Comment:  removed No date: TONSILLECTOMY  BMI    Body Mass Index:  28.62 kg/m      Reproductive/Obstetrics negative OB ROS                            Anesthesia Physical Anesthesia Plan  ASA: III  Anesthesia Plan: General   Post-op Pain Management:    Induction: Intravenous  PONV Risk Score and Plan: Propofol infusion  Airway Management Planned: Natural Airway and Nasal Cannula  Additional Equipment:   Intra-op Plan:   Post-operative Plan:   Informed Consent: I have reviewed the patients History and Physical, chart, labs and discussed the procedure including the risks, benefits and alternatives for the proposed anesthesia with the patient or authorized representative who has indicated his/her understanding and acceptance.   Dental Advisory Given  Plan Discussed with: Anesthesiologist, CRNA and Surgeon  Anesthesia Plan Comments: (Patient consented for risks of anesthesia including but not limited to:  - adverse reactions to medications - risk of intubation if required - damage to teeth, lips or other oral mucosa - sore throat  or hoarseness - Damage to heart, brain, lungs or loss of life  Patient voiced understanding.)        Anesthesia Quick Evaluation

## 2017-04-20 NOTE — Transfer of Care (Signed)
Immediate Anesthesia Transfer of Care Note  Patient: Rebecca Kramer  Procedure(s) Performed: COLONOSCOPY WITH PROPOFOL (N/A ) ESOPHAGOGASTRODUODENOSCOPY (EGD) WITH PROPOFOL (N/A )  Patient Location: PACU and Endoscopy Unit  Anesthesia Type:General  Level of Consciousness: sedated  Airway & Oxygen Therapy: Patient Spontanous Breathing and Patient connected to nasal cannula oxygen  Post-op Assessment: Report given to RN and Post -op Vital signs reviewed and stable  Post vital signs: Reviewed and stable  Last Vitals:  Vitals:   04/20/17 0745  BP: 132/90  Pulse: 75  Resp: 16  Temp: 37 C  SpO2: 99%    Last Pain:  Vitals:   04/20/17 0745  TempSrc: Tympanic         Complications: No apparent anesthesia complications

## 2017-04-20 NOTE — Anesthesia Post-op Follow-up Note (Signed)
Anesthesia QCDR form completed.        

## 2017-04-20 NOTE — H&P (Signed)
Outpatient short stay form Pre-procedure 04/20/2017 8:51 AM Rebecca Sails MD  Primary Physician: Dr. Fulton Reek  Reason for visit:  EGD and colonosocopy  History of present illness:  Patient is a 71 yo female presenting today as above .  Her last procedures was about 9 years ago,  She has previously been on a daily PPI for reflux, however, had discontinues, now having some increased symptoms.   Also presenting for a screening colonoscopy. She tolerated her prep well, she takes no asa or nsaids.      Current Facility-Administered Medications:  .  0.9 %  sodium chloride infusion, , Intravenous, Continuous, Rebecca Sails, MD, Last Rate: 20 mL/hr at 04/20/17 0800 .  0.9 %  sodium chloride infusion, , Intravenous, Continuous, Rebecca Sails, MD  Prescriptions Prior to Admission  Medication Sig Dispense Refill Last Dose  . levothyroxine (SYNTHROID, LEVOTHROID) 100 MCG tablet Take 1 tablet (100 mcg total) by mouth daily before breakfast. 90 tablet 3 04/19/2017 at Unknown time  . losartan (COZAAR) 50 MG tablet Take 1 tablet (50 mg total) by mouth daily. 90 tablet 1 04/19/2017 at Unknown time  . magnesium oxide (MAG-OX) 400 MG tablet Take 400 mg by mouth daily.   04/17/2017  . metFORMIN (GLUCOPHAGE-XR) 500 MG 24 hr tablet Take 2 tablets (1,000 mg total) by mouth daily with breakfast. 180 tablet 1 Past Week at Unknown time  . Multiple Vitamin (MULTIVITAMIN) tablet Take 1 tablet by mouth daily.   Past Week at Unknown time  . neomycin-polymyxin-hydrocortisone (CORTISPORIN) OTIC solution 3 drops 4 (four) times daily.     . polyethylene glycol powder (GLYCOLAX/MIRALAX) powder Take 1 Container by mouth once.        Allergies  Allergen Reactions  . Benzonatate     Hot flash  . Dilaudid [Hydromorphone Hcl] Other (See Comments)    Face turned red; bp     Past Medical History:  Diagnosis Date  . Arthritis   . Barrett's esophagus   . Bone spur    Left foot / posterior  .  Diabetes mellitus without complication ([place])   . Diverticulosis   . GERD (gastroesophageal reflux disease)    esophagitis with ulceration  . Hypertension   . Hypothyroidism   . Menopausal symptoms    hot flashes  . Osteopenia   . Sleep apnea 2007   AHI 45/hr in 2007    Review of systems:      Physical Exam    Heart and lungs: regular rate and rhythm without rub or gallop, lungs are bilaterally clear    HEENT: normocephalic atraumatic eyes are anicteric    Other:     Pertinant exam for procedure: soft nontender nondistended bowel sounds positive and normoactive    Planned proceedures: EGD, colonoscopy and indicated procedures. I have discussed the risks benefits and complications of procedures to include not limited to bleeding, infection, perforation and the risk of sedation and the patient wishes to proceed.    Rebecca Sails, MD Gastroenterology 04/20/2017  8:51 AM

## 2017-04-20 NOTE — Op Note (Signed)
[facility] Gastroenterology Patient Name: Rebecca Kramer Procedure Date: 04/20/2017 8:51 AM MRN: [id number] Account #: [id number] Date of Birth: March 14, 1946 Admit Type: Outpatient Age: 71 Room: [facility] ENDO ROOM 3 Gender: Female Note Status: Finalized Procedure:            Upper GI endoscopy Indications:          Suspected reflux esophagitis Providers:            Lollie Sails, MD Referring MD:         Leonie Douglas. Doy Hutching, MD (Referring MD) Medicines:            Monitored Anesthesia Care Complications:        No immediate complications. Procedure:            Pre-Anesthesia Assessment:                       - ASA Grade Assessment: III - A patient with severe                        systemic disease.                       After obtaining informed consent, the endoscope was                        passed under direct vision. Throughout the procedure,                        the patient's blood pressure, pulse, and oxygen                        saturations were monitored continuously. The Endoscope                        was introduced through the mouth, and advanced to the                        third part of duodenum. The upper GI endoscopy was                        accomplished without difficulty. The patient tolerated                        the procedure well. Findings:      LA Grade C (one or more mucosal breaks continuous between tops of 2 or       more mucosal folds, less than 75% circumference) esophagitis with no       bleeding was found. Biopsies were taken with a cold forceps for       histology.      A small hiatal hernia was found. The Z-line was a variable distance from       incisors; the hiatal hernia was sliding.      Patchy minimal inflammation characterized by erythema was found in the       gastric antrum. Biopsies were taken with a cold forceps for histology.      The cardia and gastric fundus were normal on retroflexion, otherwise,       note  hiatal hernia.      The examined duodenum was normal.      Multiple 1 to 4 mm sessile  polyps with no bleeding and no stigmata of       recent bleeding were found in the gastric body. Biopsies were taken with       a cold forceps for histology. Impression:           - LA Grade C erosive esophagitis. Biopsied.                       - Small hiatal hernia.                       - Gastritis. Biopsied.                       - Normal examined duodenum. Recommendation:       - Use Protonix (pantoprazole) 40 mg PO daily.                       - Return to GI clinic in 6 weeks. Procedure Code(s):    --- Professional ---                       [phone], Esophagogastroduodenoscopy, flexible, transoral;                        with biopsy, single or multiple Diagnosis Code(s):    --- Professional ---                       K20.8, Other esophagitis                       K44.9, Diaphragmatic hernia without obstruction or                        gangrene                       K29.70, Gastritis, unspecified, without bleeding CPT copyright 2016 American Medical Association. All rights reserved. The codes documented in this report are preliminary and upon coder review may  be revised to meet current compliance requirements. Lollie Sails, MD 04/20/2017 9:22:45 AM This report has been signed electronically. Number of Addenda: 0 Note Initiated On: 04/20/2017 8:51 AM      [facility]

## 2017-04-20 NOTE — Op Note (Signed)
[facility] Gastroenterology Patient Name: Rebecca Kramer Procedure Date: 04/20/2017 8:50 AM MRN: [id number] Account #: [id number] Date of Birth: 1945/08/31 Admit Type: Outpatient Age: 71 Room: [facility] ENDO ROOM 3 Gender: Female Note Status: Finalized Procedure:            Colonoscopy Indications:          Screening for colorectal malignant neoplasm Providers:            Lollie Sails, MD Referring MD:         Leonie Douglas. Doy Hutching, MD (Referring MD) Medicines:            Monitored Anesthesia Care Complications:        No immediate complications. Procedure:            Pre-Anesthesia Assessment:                       - ASA Grade Assessment: III - A patient with severe                        systemic disease.                       After obtaining informed consent, the colonoscope was                        passed under direct vision. Throughout the procedure,                        the patient's blood pressure, pulse, and oxygen                        saturations were monitored continuously. The                        Colonoscope was introduced through the anus with the                        intention of advancing to the cecum. The scope was                        advanced to the sigmoid colon before the procedure was                        aborted. Medications were given. The patient tolerated                        the procedure well. The quality of the bowel                        preparation was poor. Findings:      A large amount of semi-liquid, particulate stool was found in the rectum       and in the sigmoid colon, precluding visualization.      The digital rectal exam was normal. Impression:           - Preparation of the colon was poor.                       - Stool in the rectum and in the sigmoid colon.                       -  No specimens collected. Recommendation:       - Discharge patient to home.                       - reschedule and reprep  colonoscopy Procedure Code(s):    --- Professional ---                       [phone], 53, Colonoscopy, flexible; diagnostic, including                        collection of specimen(s) by brushing or washing, when                        performed (separate procedure) Diagnosis Code(s):    --- Professional ---                       Z12.11, Encounter for screening for malignant neoplasm                        of colon CPT copyright 2016 American Medical Association. All rights reserved. The codes documented in this report are preliminary and upon coder review may  be revised to meet current compliance requirements. Lollie Sails, MD 04/20/2017 9:38:19 AM This report has been signed electronically. Number of Addenda: 0 Note Initiated On: 04/20/2017 8:50 AM Scope Withdrawal Time: 0 hours 2 minutes 2 seconds  Total Procedure Duration: 0 hours 9 minutes 34 seconds       [facility]

## 2017-04-20 NOTE — Anesthesia Postprocedure Evaluation (Signed)
Anesthesia Post Note  Patient: JAZAE GANDOLFI  Procedure(s) Performed: COLONOSCOPY WITH PROPOFOL (N/A ) ESOPHAGOGASTRODUODENOSCOPY (EGD) WITH PROPOFOL (N/A )  Patient location during evaluation: Endoscopy Anesthesia Type: General Level of consciousness: awake and alert Pain management: pain level controlled Vital Signs Assessment: post-procedure vital signs reviewed and stable Respiratory status: spontaneous breathing, nonlabored ventilation, respiratory function stable and patient connected to nasal cannula oxygen Cardiovascular status: blood pressure returned to baseline and stable Postop Assessment: no apparent nausea or vomiting Anesthetic complications: no     Last Vitals:  Vitals:   04/20/17 0959 04/20/17 1009  BP: 128/78 (!) 142/85  Pulse: 67 69  Resp: 17 17  Temp:    SpO2: 99% 99%    Last Pain:  Vitals:   04/20/17 0939  TempSrc: Tympanic                 Precious Haws Klara Stjames

## 2017-04-21 ENCOUNTER — Encounter: Payer: Self-pay | Admitting: Gastroenterology

## 2017-04-22 LAB — SURGICAL PATHOLOGY

## 2017-04-28 ENCOUNTER — Ambulatory Visit: Payer: BLUE CROSS/BLUE SHIELD | Admitting: Internal Medicine

## 2017-05-04 DIAGNOSIS — E119 Type 2 diabetes mellitus without complications: Secondary | ICD-10-CM | POA: Diagnosis not present

## 2017-05-04 DIAGNOSIS — K2270 Barrett's esophagus without dysplasia: Secondary | ICD-10-CM | POA: Diagnosis not present

## 2017-05-04 DIAGNOSIS — Z79899 Other long term (current) drug therapy: Secondary | ICD-10-CM | POA: Diagnosis not present

## 2017-05-04 DIAGNOSIS — I10 Essential (primary) hypertension: Secondary | ICD-10-CM | POA: Diagnosis not present

## 2017-05-04 DIAGNOSIS — E079 Disorder of thyroid, unspecified: Secondary | ICD-10-CM | POA: Diagnosis not present

## 2017-05-07 DIAGNOSIS — G4733 Obstructive sleep apnea (adult) (pediatric): Secondary | ICD-10-CM | POA: Diagnosis not present

## 2017-05-10 DIAGNOSIS — G4733 Obstructive sleep apnea (adult) (pediatric): Secondary | ICD-10-CM | POA: Diagnosis not present

## 2017-05-31 ENCOUNTER — Other Ambulatory Visit: Payer: Self-pay | Admitting: Internal Medicine

## 2017-06-04 DIAGNOSIS — I10 Essential (primary) hypertension: Secondary | ICD-10-CM | POA: Diagnosis not present

## 2017-06-06 DIAGNOSIS — G4733 Obstructive sleep apnea (adult) (pediatric): Secondary | ICD-10-CM | POA: Diagnosis not present

## 2017-06-16 ENCOUNTER — Encounter: Payer: Self-pay | Admitting: *Deleted

## 2017-06-17 ENCOUNTER — Ambulatory Visit
Admission: RE | Admit: 2017-06-17 | Discharge: 2017-06-17 | Disposition: A | Discharge status: Home / Self Care | Payer: BLUE CROSS/BLUE SHIELD | Source: Ambulatory Visit | Attending: Gastroenterology | Admitting: Gastroenterology

## 2017-06-17 ENCOUNTER — Encounter
Admission: RE | Disposition: A | Discharge status: Home / Self Care | Payer: Self-pay | Source: Ambulatory Visit | Attending: Gastroenterology

## 2017-06-17 ENCOUNTER — Ambulatory Visit: Payer: BLUE CROSS/BLUE SHIELD | Admitting: Certified Registered Nurse Anesthetist

## 2017-06-17 ENCOUNTER — Encounter: Payer: Self-pay | Admitting: *Deleted

## 2017-06-17 DIAGNOSIS — K449 Diaphragmatic hernia without obstruction or gangrene: Secondary | ICD-10-CM | POA: Insufficient documentation

## 2017-06-17 DIAGNOSIS — G4730 Sleep apnea, unspecified: Secondary | ICD-10-CM | POA: Diagnosis not present

## 2017-06-17 DIAGNOSIS — E119 Type 2 diabetes mellitus without complications: Secondary | ICD-10-CM | POA: Diagnosis not present

## 2017-06-17 DIAGNOSIS — E039 Hypothyroidism, unspecified: Secondary | ICD-10-CM | POA: Insufficient documentation

## 2017-06-17 DIAGNOSIS — D120 Benign neoplasm of cecum: Secondary | ICD-10-CM | POA: Diagnosis not present

## 2017-06-17 DIAGNOSIS — K5730 Diverticulosis of large intestine without perforation or abscess without bleeding: Secondary | ICD-10-CM | POA: Diagnosis not present

## 2017-06-17 DIAGNOSIS — Z79899 Other long term (current) drug therapy: Secondary | ICD-10-CM | POA: Insufficient documentation

## 2017-06-17 DIAGNOSIS — Q438 Other specified congenital malformations of intestine: Secondary | ICD-10-CM | POA: Diagnosis not present

## 2017-06-17 DIAGNOSIS — Z888 Allergy status to other drugs, medicaments and biological substances status: Secondary | ICD-10-CM | POA: Diagnosis not present

## 2017-06-17 DIAGNOSIS — Z7984 Long term (current) use of oral hypoglycemic drugs: Secondary | ICD-10-CM | POA: Diagnosis not present

## 2017-06-17 DIAGNOSIS — K5790 Diverticulosis of intestine, part unspecified, without perforation or abscess without bleeding: Secondary | ICD-10-CM | POA: Diagnosis not present

## 2017-06-17 DIAGNOSIS — Z1211 Encounter for screening for malignant neoplasm of colon: Secondary | ICD-10-CM | POA: Insufficient documentation

## 2017-06-17 DIAGNOSIS — Z885 Allergy status to narcotic agent status: Secondary | ICD-10-CM | POA: Diagnosis not present

## 2017-06-17 DIAGNOSIS — K2950 Unspecified chronic gastritis without bleeding: Secondary | ICD-10-CM | POA: Diagnosis not present

## 2017-06-17 DIAGNOSIS — M8580 Other specified disorders of bone density and structure, unspecified site: Secondary | ICD-10-CM | POA: Diagnosis not present

## 2017-06-17 DIAGNOSIS — K2210 Ulcer of esophagus without bleeding: Secondary | ICD-10-CM | POA: Diagnosis not present

## 2017-06-17 DIAGNOSIS — K210 Gastro-esophageal reflux disease with esophagitis: Secondary | ICD-10-CM | POA: Diagnosis not present

## 2017-06-17 DIAGNOSIS — I10 Essential (primary) hypertension: Secondary | ICD-10-CM | POA: Diagnosis not present

## 2017-06-17 DIAGNOSIS — K635 Polyp of colon: Secondary | ICD-10-CM | POA: Diagnosis not present

## 2017-06-17 HISTORY — PX: COLONOSCOPY WITH PROPOFOL: SHX5780

## 2017-06-17 HISTORY — PX: ESOPHAGOGASTRODUODENOSCOPY (EGD) WITH PROPOFOL: SHX5813

## 2017-06-17 LAB — GLUCOSE, CAPILLARY: GLUCOSE-CAPILLARY: 102 mg/dL — AB (ref 65–99)

## 2017-06-17 SURGERY — COLONOSCOPY WITH PROPOFOL
Anesthesia: General

## 2017-06-17 MED ORDER — LIDOCAINE HCL (CARDIAC) 20 MG/ML IV SOLN
INTRAVENOUS | Status: DC | PRN
  Administered 2017-06-17: 50 mg via INTRAVENOUS

## 2017-06-17 MED ORDER — PROPOFOL 10 MG/ML IV BOLUS
INTRAVENOUS | Status: DC | PRN
  Administered 2017-06-17: 20 mg via INTRAVENOUS
  Administered 2017-06-17: 30 mg via INTRAVENOUS
  Administered 2017-06-17 (×2): 40 mg via INTRAVENOUS
  Administered 2017-06-17: 20 mg via INTRAVENOUS

## 2017-06-17 MED ORDER — GLYCOPYRROLATE 0.2 MG/ML IJ SOLN
INTRAMUSCULAR | Status: AC
  Filled 2017-06-17: qty 1

## 2017-06-17 MED ORDER — PROPOFOL 500 MG/50ML IV EMUL
INTRAVENOUS | Status: AC
  Filled 2017-06-17: qty 50

## 2017-06-17 MED ORDER — SODIUM CHLORIDE 0.9 % IV SOLN: INTRAVENOUS | Status: DC

## 2017-06-17 MED ORDER — SODIUM CHLORIDE 0.9 % IV SOLN
INTRAVENOUS | Status: DC
  Administered 2017-06-17 (×2): via INTRAVENOUS

## 2017-06-17 MED ORDER — PROPOFOL 500 MG/50ML IV EMUL
INTRAVENOUS | Status: DC | PRN
  Administered 2017-06-17: 180 ug/kg/min via INTRAVENOUS

## 2017-06-17 MED ORDER — LIDOCAINE HCL (PF) 2 % IJ SOLN
INTRAMUSCULAR | Status: AC
  Filled 2017-06-17: qty 10

## 2017-06-17 NOTE — H&P (Signed)
Outpatient short stay form Pre-procedure 06/17/2017 11:42 AM Lollie Sails MD  Primary Physician: Dr. Fulton Reek  Reason for visit:  EGD and colonoscopy  History of present illness:  Patient is a 71 year old female presenting today as above. She has a history of EGD being done for gastric esophageal reflux on 04/20/2017 with a finding of grade C erosive esophagitis. She is presenting today for follow-up on that. She states her symptoms are much improved taking a daily proton pump inhibitor. Also on that date there was a colonoscopy that was attempted however could not be completed due to poor prep. She is also been rescheduled today for that procedure as well. She tolerated her prep well. She takes no aspirin or blood thinning agent.    Current Facility-Administered Medications:  .  0.9 %  sodium chloride infusion, , Intravenous, Continuous, Lollie Sails, MD, Last Rate: 20 mL/hr at 06/17/17 1106 .  0.9 %  sodium chloride infusion, , Intravenous, Continuous, Lollie Sails, MD  Medications Prior to Admission  Medication Sig Dispense Refill Last Dose  . levothyroxine (SYNTHROID, LEVOTHROID) 100 MCG tablet TAKE 1 TABLET DAILY BEFORE BREAKFAST 90 tablet 0 06/16/2017 at Unknown time  . losartan (COZAAR) 50 MG tablet Take 1 tablet (50 mg total) by mouth daily. 90 tablet 1 06/17/2017 at Unknown time  . magnesium oxide (MAG-OX) 400 MG tablet Take 400 mg by mouth daily.   06/16/2017 at Unknown time  . metFORMIN (GLUCOPHAGE-XR) 500 MG 24 hr tablet Take 2 tablets (1,000 mg total) by mouth daily with breakfast. 180 tablet 1 06/16/2017 at Unknown time  . Multiple Vitamin (MULTIVITAMIN) tablet Take 1 tablet by mouth daily.   Past Week at Unknown time  . polyethylene glycol powder (GLYCOLAX/MIRALAX) powder Take 1 Container by mouth once.   Past Week at Unknown time  . neomycin-polymyxin-hydrocortisone (CORTISPORIN) OTIC solution 3 drops 4 (four) times daily.   Completed Course at Unknown  time     Allergies  Allergen Reactions  . Benzonatate     Hot flash  . Dilaudid [Hydromorphone Hcl] Other (See Comments)    Face turned red; bp     Past Medical History:  Diagnosis Date  . Arthritis   . Barrett's esophagus   . Bone spur    Left foot / posterior  . Diabetes mellitus without complication ([place])   . Diverticulosis   . GERD (gastroesophageal reflux disease)    esophagitis with ulceration  . Hypertension   . Hypothyroidism   . Menopausal symptoms    hot flashes  . Osteopenia   . Sleep apnea 2007   AHI 45/hr in 2007    Review of systems:      Physical Exam    Heart and lungs: Regular rate and rhythm without rub or gallop, lungs are bilaterally clear.    HEENT: Normocephalic atraumatic eyes are anicteric    Other:     Pertinant exam for procedure: Soft nontender nondistended bowel sounds positive normoactive    Planned proceedures: EGD, colonoscopy and indicated procedures. I have discussed the risks benefits and complications of procedures to include not limited to bleeding, infection, perforation and the risk of sedation and the patient wishes to proceed.    Lollie Sails, MD Gastroenterology 06/17/2017  11:42 AM

## 2017-06-17 NOTE — Anesthesia Procedure Notes (Addendum)
Date/Time: 06/17/2017 11:49 AM Performed by: Johnna Acosta, CRNA Pre-anesthesia Checklist: Patient identified, Emergency Drugs available, Patient being monitored, Suction available and Timeout performed Patient Re-evaluated:Patient Re-evaluated prior to induction Oxygen Delivery Method: Nasal cannula Preoxygenation: Pre-oxygenation with 100% oxygen

## 2017-06-17 NOTE — Anesthesia Preprocedure Evaluation (Signed)
Anesthesia Evaluation  Patient identified by MRN, date of birth, ID band Patient awake    Reviewed: Allergy & Precautions, NPO status , Patient's Chart, lab work & pertinent test results  History of Anesthesia Complications Negative for: history of anesthetic complications  Airway Mallampati: II       Dental   Pulmonary sleep apnea and Continuous Positive Airway Pressure Ventilation ,           Cardiovascular hypertension, Pt. on medications      Neuro/Psych neg Seizures    GI/Hepatic Neg liver ROS, GERD  Medicated and Controlled,  Endo/Other  diabetes, Type 2, Oral Hypoglycemic AgentsHypothyroidism   Renal/GU negative Renal ROS     Musculoskeletal   Abdominal   Peds  Hematology   Anesthesia Other Findings   Reproductive/Obstetrics                             Anesthesia Physical Anesthesia Plan  ASA: II  Anesthesia Plan: General   Post-op Pain Management:    Induction: Intravenous  PONV Risk Score and Plan: 3 and TIVA, Propofol infusion and Ondansetron  Airway Management Planned: Nasal Cannula  Additional Equipment:   Intra-op Plan:   Post-operative Plan:   Informed Consent: I have reviewed the patients History and Physical, chart, labs and discussed the procedure including the risks, benefits and alternatives for the proposed anesthesia with the patient or authorized representative who has indicated his/her understanding and acceptance.     Plan Discussed with:   Anesthesia Plan Comments:         Anesthesia Quick Evaluation

## 2017-06-17 NOTE — Op Note (Signed)
[facility] Gastroenterology Patient Name: Rebecca Kramer Procedure Date: 06/17/2017 11:39 AM MRN: [id number] Account #: [id number] Date of Birth: 04/29/1946 Admit Type: Outpatient Age: 71 Room: [facility] ENDO ROOM 1 Gender: Female Note Status: Finalized Procedure:            Upper GI endoscopy Indications:          Follow-up of esophagitis Providers:            Lollie Sails, MD Referring MD:         Leonie Douglas. Doy Hutching, MD (Referring MD) Medicines:            Monitored Anesthesia Care Complications:        No immediate complications. Procedure:            Pre-Anesthesia Assessment:                       - ASA Grade Assessment: II - A patient with mild                        systemic disease.                       After obtaining informed consent, the endoscope was                        passed under direct vision. Throughout the procedure,                        the patient's blood pressure, pulse, and oxygen                        saturations were monitored continuously. The                        Colonoscope was introduced through the mouth, and                        advanced to the second part of duodenum. The upper GI                        endoscopy was accomplished without difficulty. The                        patient tolerated the procedure well. Findings:      A medium-sized hiatal hernia was found. The Z-line was a variable       distance from incisors; the hiatal hernia was sliding.      LA Grade B-C (one or more mucosal breaks continuous between tops of 2 or       more mucosal folds, less than 75% circumference) esophagitis with no       bleeding was found, improved in appearance from previous. Biopsies were       taken with a cold forceps for histology.      The entire examined stomach was normal.      The cardia and gastric fundus were normal on retroflexion.      The examined duodenum was normal. Impression:           - Medium-sized hiatal  hernia.                       -  LA Grade C erosive esophagitis. Biopsied.                       - Normal stomach.                       - Normal examined duodenum. Recommendation:       - Use Protonix (pantoprazole) 40 mg PO BID for 4 weeks.                       - Use Protonix (pantoprazole) 40 mg PO daily daily. Procedure Code(s):    --- Professional ---                       [phone], Esophagogastroduodenoscopy, flexible, transoral;                        with biopsy, single or multiple Diagnosis Code(s):    --- Professional ---                       K44.9, Diaphragmatic hernia without obstruction or                        gangrene                       K20.8, Other esophagitis CPT copyright 2016 American Medical Association. All rights reserved. The codes documented in this report are preliminary and upon coder review may  be revised to meet current compliance requirements. Lollie Sails, MD 06/17/2017 12:07:46 PM This report has been signed electronically. Number of Addenda: 0 Note Initiated On: 06/17/2017 11:39 AM      [facility]

## 2017-06-17 NOTE — Anesthesia Postprocedure Evaluation (Signed)
Anesthesia Post Note  Patient: Rebecca Kramer  Procedure(s) Performed: COLONOSCOPY WITH PROPOFOL (N/A ) ESOPHAGOGASTRODUODENOSCOPY (EGD) WITH PROPOFOL  Patient location during evaluation: Endoscopy Anesthesia Type: General Level of consciousness: awake and alert Pain management: pain level controlled Vital Signs Assessment: post-procedure vital signs reviewed and stable Respiratory status: spontaneous breathing and respiratory function stable Cardiovascular status: stable Anesthetic complications: no     Last Vitals:  Vitals:   06/17/17 1301 06/17/17 1321  BP:    Pulse:    Resp: 16 16  Temp:    SpO2:      Last Pain:  Vitals:   06/17/17 1251  TempSrc: Tympanic                 KEPHART,WILLIAM K

## 2017-06-17 NOTE — Op Note (Signed)
[facility] Gastroenterology Patient Name: Rebecca Kramer Procedure Date: 06/17/2017 11:39 AM MRN: [id number] Account #: [id number] Date of Birth: 02-08-1946 Admit Type: Outpatient Age: 71 Room: [facility] ENDO ROOM 1 Gender: Female Note Status: Finalized Procedure:            Colonoscopy Indications:          Screening for colorectal malignant neoplasm Providers:            Lollie Sails, MD Referring MD:         Leonie Douglas. Doy Hutching, MD (Referring MD) Medicines:            Monitored Anesthesia Care Complications:        No immediate complications. Procedure:            Pre-Anesthesia Assessment:                       - ASA Grade Assessment: II - A patient with mild                        systemic disease.                       After obtaining informed consent, the colonoscope was                        passed under direct vision. Throughout the procedure,                        the patient's blood pressure, pulse, and oxygen                        saturations were monitored continuously. The                        Colonoscope was introduced through the anus and                        advanced to the the cecum, identified by appendiceal                        orifice and ileocecal valve. The colonoscopy was                        unusually difficult due to significant looping and a                        tortuous colon. Successful completion of the procedure                        was aided by changing the patient to a supine position,                        changing the patient to a prone position, applying                        abdominal pressure and lavage. The quality of the bowel                        preparation was fair. Findings:      A 4 mm polyp was found in the  cecum. The polyp was sessile. The polyp       was removed with a cold snare. Resection and retrieval were complete.      A few small-mouthed diverticula were found in the sigmoid colon and      descending colon. Multiple passes in the rectum showed normal, unable to       retroflex due to loss of insufflation.      The digital rectal exam was normal. Impression:           - Preparation of the colon was fair.                       - One 4 mm polyp in the cecum, removed with a cold                        snare. Resected and retrieved.                       - Diverticulosis in the sigmoid colon and in the                        descending colon. Recommendation:       - Discharge patient to home. Procedure Code(s):    --- Professional ---                       [phone], Colonoscopy, flexible; with removal of tumor(s),                        polyp(s), or other lesion(s) by snare technique Diagnosis Code(s):    --- Professional ---                       Z12.11, Encounter for screening for malignant neoplasm                        of colon                       D12.0, Benign neoplasm of cecum                       K57.30, Diverticulosis of large intestine without                        perforation or abscess without bleeding CPT copyright 2016 American Medical Association. All rights reserved. The codes documented in this report are preliminary and upon coder review may  be revised to meet current compliance requirements. Lollie Sails, MD 06/17/2017 12:50:32 PM This report has been signed electronically. Number of Addenda: 0 Note Initiated On: 06/17/2017 11:39 AM Scope Withdrawal Time: 0 hours 8 minutes 57 seconds  Total Procedure Duration: 0 hours 34 minutes 55 seconds       [facility]

## 2017-06-17 NOTE — Anesthesia Post-op Follow-up Note (Signed)
Anesthesia QCDR form completed.        

## 2017-06-17 NOTE — Transfer of Care (Signed)
Immediate Anesthesia Transfer of Care Note  Patient: Rebecca Kramer  Procedure(s) Performed: Procedure(s): COLONOSCOPY WITH PROPOFOL (N/A) ESOPHAGOGASTRODUODENOSCOPY (EGD) WITH PROPOFOL  Patient Location: PACU  Anesthesia Type:General  Level of Consciousness: sedated  Airway & Oxygen Therapy: Patient Spontanous Breathing and Patient connected to face mask oxygen  Post-op Assessment: Report given to RN and Post -op Vital signs reviewed and stable  Post vital signs: Reviewed and stable  Last Vitals:  Vitals:   06/17/17 1039 06/17/17 1251  BP: (!) 163/96 (!) 146/94  Pulse: 75 81  Resp: 16 17  Temp: (!) 36.4 C   SpO2: 833% 38%    Complications: No apparent anesthesia complications

## 2017-06-21 ENCOUNTER — Encounter: Payer: Self-pay | Admitting: Gastroenterology

## 2017-06-21 LAB — SURGICAL PATHOLOGY

## 2017-06-22 DIAGNOSIS — D369 Benign neoplasm, unspecified site: Secondary | ICD-10-CM | POA: Diagnosis not present

## 2017-06-22 DIAGNOSIS — K210 Gastro-esophageal reflux disease with esophagitis: Secondary | ICD-10-CM | POA: Diagnosis not present

## 2017-07-07 DIAGNOSIS — G4733 Obstructive sleep apnea (adult) (pediatric): Secondary | ICD-10-CM | POA: Diagnosis not present

## 2017-07-09 DIAGNOSIS — G4733 Obstructive sleep apnea (adult) (pediatric): Secondary | ICD-10-CM | POA: Diagnosis not present

## 2017-07-19 ENCOUNTER — Other Ambulatory Visit: Payer: Self-pay | Admitting: Internal Medicine

## 2017-07-19 DIAGNOSIS — Z1231 Encounter for screening mammogram for malignant neoplasm of breast: Secondary | ICD-10-CM

## 2017-07-20 ENCOUNTER — Ambulatory Visit: Payer: BLUE CROSS/BLUE SHIELD | Admitting: Internal Medicine

## 2017-07-20 ENCOUNTER — Encounter: Payer: Self-pay | Admitting: Internal Medicine

## 2017-07-20 VITALS — BP 128/80 | HR 77 | Ht 65.0 in | Wt 190.0 lb

## 2017-07-20 DIAGNOSIS — E038 Other specified hypothyroidism: Secondary | ICD-10-CM | POA: Diagnosis not present

## 2017-07-20 DIAGNOSIS — E063 Autoimmune thyroiditis: Secondary | ICD-10-CM

## 2017-07-20 NOTE — Patient Instructions (Addendum)
Please continue Levothyroxine 100 mcg daily.  Take the thyroid hormone every day, with water, at least 30 minutes before breakfast, separated by at least 4 hours from: - acid reflux medications - calcium - iron - multivitamins  Move the multivitamins and calcium later in the day.  Please return in 1.5 months for labs.  Please return in 1 year.

## 2017-07-20 NOTE — Progress Notes (Addendum)
Patient ID: Rebecca Kramer, female   DOB: 08-Aug-1945, 72 y.o.   MRN: [id number]    HPI  Rebecca Kramer is a 72 y.o.-year-old female, initially referred by her PCP, Dr.Tower, returning for f/u for Hashimoto hypothyroidism. She was seeing Dr Sharol Roussel [facility]) >> stopped seeing her 2/2 cost.  Visit 1 year and 3 months ago.  She has esophagitis and gastritis.   Pt. has been dx with hypothyroidism"many years ago" >> she was on: -Generic levothyroxine -Brand names -Then, Naturethroid 81.5 mg + Cytomel 5 mg bid (equivalent to ~180 mcg LT4)  -Currently back on generic levothyroxine  She takes levothyroxine 100 mcg daily - in am - With water - waits 30 min for coffee - eats b'fast rarely - waits >30 min - Calcium and multivitamins in am - maybe not 4h later - started PPIs (Protonix) in 04/2017 - was on 2 tabs per day , now 1 a day starting yesterday - all >4h later - no iron  I reviewed pt's thyroid tests - normal: 05/05/2017: TSH 1.233 total T4 9.4 11/02/2016: TSH 0.435, total T4 11.6  Lab Results  Component Value Date   TSH 1.52 04/28/2016   TSH 2.02 01/16/2016   TSH 1.97 12/05/2015   TSH 1.27 11/19/2015   TSH 0.43 01/10/2015   TSH 1.12 11/26/2014   TSH 0.79 07/23/2014   TSH 1.04 05/08/2014   TSH 0.12 (L) 10/07/2012   TSH 3.56 07/17/2011   FREET4 0.88 04/28/2016   FREET4 0.86 01/16/2016   FREET4 1.02 11/19/2015   FREET4 1.33 01/10/2015   FREET4 1.23 07/23/2014   FREET4 1.02 05/08/2014   FREET4 1.4 10/08/2008   03/14/2014: ATA 313 (<2) TPO Abs 39 (<9) TSH 3.552 (0.35-4.5), free T4 0.82 (0.8-1.8), Free T3 2.6 (2.3-4.2), reverse T3 12 (8-25) At the same time, she hadn't hemoglobin A1c 6.3, fasting insulin 16.6, vitamin D 66  Pt denies: - feeling nodules in neck - hoarseness - dysphagia - choking - SOB with lying down  No FH of thyroid ds. Or thyroidcancer. No h/o radiation tx to head or neck.  No seaweed or kelp. No recent contrast studies. No herbal  supplements. No Biotin use. No recent steroids use.   Exercises at [employer] 3 times a week.  ROS: Constitutional: no weight gain/no weight loss, no fatigue, no subjective hyperthermia, no subjective hypothermia Eyes: no blurry vision, no xerophthalmia ENT: no sore throat,+ see HPI Cardiovascular: no CP/no SOB/no palpitations/no leg swelling Respiratory: no cough/no SOB/no wheezing Gastrointestinal: no N/no V/no D/no C/no acid reflux Musculoskeletal: no muscle aches/no joint aches Skin: no rashes, no hair loss Neurological: no tremors/no numbness/no tingling/no dizziness  I reviewed pt's medications, allergies, PMH, social hx, family hx, and changes were documented in the history of present illness. Otherwise, unchanged from my initial visit note.  Past Medical History:  Diagnosis Date  . Arthritis   . Barrett's esophagus   . Bone spur    Left foot / posterior  . Diabetes mellitus without complication ([place])   . Diverticulosis   . GERD (gastroesophageal reflux disease)    esophagitis with ulceration  . Hypertension   . Hypothyroidism   . Menopausal symptoms    hot flashes  . Osteopenia   . Sleep apnea 2007   AHI 45/hr in 2007   Past Surgical History:  Procedure Laterality Date  . ABDOMINAL HYSTERECTOMY  1983   partial/endometriosis  . BREAST SURGERY  1970's   breast lumpectomy/benign  . [place]  breast implants/saline  . COLONOSCOPY WITH PROPOFOL N/A 04/20/2017   Procedure: COLONOSCOPY WITH PROPOFOL;  Surgeon: Lollie Sails, MD;  Location: [facility] ENDOSCOPY;  Service: Endoscopy;  Laterality: N/A;  . COLONOSCOPY WITH PROPOFOL N/A 06/17/2017   Procedure: COLONOSCOPY WITH PROPOFOL;  Surgeon: Lollie Sails, MD;  Location: [facility] ENDOSCOPY;  Service: Endoscopy;  Laterality: N/A;  . DORSAL COMPARTMENT RELEASE  05/14/2011   Procedure: RELEASE DORSAL COMPARTMENT (DEQUERVAIN);  Surgeon: Cammie Sickle., MD;  Location: [facility];   Service: Orthopedics;  Laterality: Right;  right release dorsal compartment/ de quervain release with debridement of mucoid cyst  . ESOPHAGOGASTRODUODENOSCOPY  03/2008   esophagitis with ulcerations/?barretts  . ESOPHAGOGASTRODUODENOSCOPY (EGD) WITH PROPOFOL N/A 04/20/2017   Procedure: ESOPHAGOGASTRODUODENOSCOPY (EGD) WITH PROPOFOL;  Surgeon: Lollie Sails, MD;  Location: [facility] ENDOSCOPY;  Service: Endoscopy;  Laterality: N/A;  . ESOPHAGOGASTRODUODENOSCOPY (EGD) WITH PROPOFOL  06/17/2017   Procedure: ESOPHAGOGASTRODUODENOSCOPY (EGD) WITH PROPOFOL;  Surgeon: Lollie Sails, MD;  Location: [facility] ENDOSCOPY;  Service: Endoscopy;;  . thumb nodule     removed  . TONSILLECTOMY     History   Social History  . Marital Status: Married    Spouse Name: N/A    Number of Children: 2   Occupational History  . retired   Social History Main Topics  . Smoking status: Never Smoker   . Smokeless tobacco: Never Used  . Alcohol Use: No  . Drug Use: No   Current Outpatient Medications on File Prior to Visit  Medication Sig Dispense Refill  . levothyroxine (SYNTHROID, LEVOTHROID) 100 MCG tablet TAKE 1 TABLET DAILY BEFORE BREAKFAST 90 tablet 0  . losartan (COZAAR) 50 MG tablet Take 1 tablet (50 mg total) by mouth daily. 90 tablet 1  . magnesium oxide (MAG-OX) 400 MG tablet Take 400 mg by mouth daily.    . metFORMIN (GLUCOPHAGE-XR) 500 MG 24 hr tablet Take 2 tablets (1,000 mg total) by mouth daily with breakfast. 180 tablet 1  . Multiple Vitamin (MULTIVITAMIN) tablet Take 1 tablet by mouth daily.    Marland Kitchen neomycin-polymyxin-hydrocortisone (CORTISPORIN) OTIC solution 3 drops 4 (four) times daily.    . polyethylene glycol powder (GLYCOLAX/MIRALAX) powder Take 1 Container by mouth once.     No current facility-administered medications on file prior to visit.    Allergies  Allergen Reactions  . Benzonatate     Hot flash  . Dilaudid [Hydromorphone Hcl] Other (See Comments)    Face turned red; bp    Family History  Problem Relation Age of Onset  . Hypertension Mother   . Hyperlipidemia Mother   . Colon polyps Mother   . Heart disease Mother   . Migraines Mother   . Cancer Father        lung CA  . Migraines Brother   . Cancer Maternal Aunt        breast CA  . Breast cancer Maternal Aunt    PE: BP 128/80   Pulse 77   Ht 5\' 5"  (1.651 m)   Wt 190 lb (86.2 kg)   SpO2 99%   BMI 31.62 kg/m  Wt Readings from Last 3 Encounters:  07/20/17 190 lb (86.2 kg)  06/17/17 180 lb (81.6 kg)  04/20/17 172 lb (78 kg)   Constitutional: overweight, in NAD Eyes: PERRLA, EOMI, no exophthalmos ENT: moist mucous membranes, no thyromegaly, no cervical lymphadenopathy Cardiovascular: RRR, No MRG Respiratory: CTA B Gastrointestinal: abdomen soft, NT, ND, BS+ Musculoskeletal: no deformities, strength intact in all  4 Skin: moist, warm, no rashes Neurological: no tremor with outstretched hands, DTR normal in all 4  ASSESSMENT: 1. Hypothyroidism - Hashimoto's thyroiditis  PLAN:  1. Patient with long-standing Hashimoto's hypothyroidism, on levothyroxine therapy. She has GERD and since last visit she was on 2x a day PPI, changed to once a day yesterday. - latest thyroid labs reviewed with pt >> normal in 04/2017 - she continues on LT4 100 Mcg daily - pt feels good on this dose. - we discussed about taking the thyroid hormone every day, with water, 3 hours after dinner, separated by >4 hours from acid reflux medications, calcium, iron, multivitamins. Pt. is taking it correctly  except MVI and Calcium are too close to LT4 >> advised to move them later.  - will check thyroid tests in 1.5 mo after moving MVI and Calcium later and also after decreasing her PPI dose: TSH and fT4 - RTC in 1 year  Orders Placed This Encounter  Procedures  . T4, free    Standing Status:   Future    Standing Expiration Date:   07/20/2018  . TSH    Standing Status:   Future    Standing Expiration Date:   07/20/2018    - time spent with the patient: 15 min, of which >50% was spent in obtaining information about her interim PMH, reviewing her previous labs, evaluations, and treatments, counseling her about her condition (please see the discussed topics above), and developing a plan to further investigate it.  Lab Results  Component Value Date   TSH 1.24 08/19/2017   FREET4 0.99 08/19/2017   Normal TFTs.   Philemon Kingdom, MD PhD [facility] Endocrinology

## 2017-08-03 ENCOUNTER — Other Ambulatory Visit: Payer: Self-pay | Admitting: Internal Medicine

## 2017-08-03 ENCOUNTER — Telehealth: Payer: Self-pay | Admitting: Internal Medicine

## 2017-08-03 DIAGNOSIS — I10 Essential (primary) hypertension: Secondary | ICD-10-CM | POA: Diagnosis not present

## 2017-08-03 DIAGNOSIS — G459 Transient cerebral ischemic attack, unspecified: Secondary | ICD-10-CM | POA: Diagnosis not present

## 2017-08-03 DIAGNOSIS — E119 Type 2 diabetes mellitus without complications: Secondary | ICD-10-CM | POA: Diagnosis not present

## 2017-08-03 NOTE — Telephone Encounter (Signed)
Sorry to hear that. No, none of the 2 interact with the thyroid med.

## 2017-08-03 NOTE — Telephone Encounter (Signed)
Patient asking for the nurse to call her, she has some questions about the taking of her medication.please advise

## 2017-08-03 NOTE — Telephone Encounter (Signed)
Went to see PCP today because she had an "episode" where she couldn't read and had slurred speech over the weekend. She said she is calling because she is now on Losartan BID at 50mg  & baby aspirin and would like to know if this will effect her thyroid medication and should she leave a certain time in between these medications. Please advise

## 2017-08-04 NOTE — Telephone Encounter (Signed)
Sent MyChart msg

## 2017-08-05 ENCOUNTER — Other Ambulatory Visit: Payer: Self-pay | Admitting: Internal Medicine

## 2017-08-05 DIAGNOSIS — G459 Transient cerebral ischemic attack, unspecified: Secondary | ICD-10-CM

## 2017-08-07 DIAGNOSIS — G4733 Obstructive sleep apnea (adult) (pediatric): Secondary | ICD-10-CM | POA: Diagnosis not present

## 2017-08-10 DIAGNOSIS — G459 Transient cerebral ischemic attack, unspecified: Secondary | ICD-10-CM | POA: Diagnosis not present

## 2017-08-10 DIAGNOSIS — I6523 Occlusion and stenosis of bilateral carotid arteries: Secondary | ICD-10-CM | POA: Diagnosis not present

## 2017-08-12 ENCOUNTER — Ambulatory Visit
Admission: RE | Admit: 2017-08-12 | Discharge: 2017-08-12 | Disposition: A | Discharge status: Home / Self Care | Payer: BLUE CROSS/BLUE SHIELD | Source: Ambulatory Visit | Attending: Internal Medicine | Admitting: Internal Medicine

## 2017-08-12 DIAGNOSIS — G459 Transient cerebral ischemic attack, unspecified: Secondary | ICD-10-CM

## 2017-08-12 DIAGNOSIS — R479 Unspecified speech disturbances: Secondary | ICD-10-CM | POA: Diagnosis not present

## 2017-08-17 DIAGNOSIS — I10 Essential (primary) hypertension: Secondary | ICD-10-CM | POA: Diagnosis not present

## 2017-08-18 ENCOUNTER — Other Ambulatory Visit: Payer: Self-pay | Admitting: Internal Medicine

## 2017-08-18 DIAGNOSIS — I10 Essential (primary) hypertension: Secondary | ICD-10-CM

## 2017-08-19 ENCOUNTER — Other Ambulatory Visit (INDEPENDENT_AMBULATORY_CARE_PROVIDER_SITE_OTHER): Payer: BLUE CROSS/BLUE SHIELD

## 2017-08-19 DIAGNOSIS — E063 Autoimmune thyroiditis: Secondary | ICD-10-CM

## 2017-08-19 DIAGNOSIS — E038 Other specified hypothyroidism: Secondary | ICD-10-CM | POA: Diagnosis not present

## 2017-08-19 LAB — TSH: TSH: 1.24 u[IU]/mL (ref 0.35–4.50)

## 2017-08-19 LAB — T4, FREE: Free T4: 0.99 ng/dL (ref 0.60–1.60)

## 2017-08-20 ENCOUNTER — Telehealth: Payer: Self-pay | Admitting: Internal Medicine

## 2017-08-20 ENCOUNTER — Ambulatory Visit
Admission: RE | Admit: 2017-08-20 | Discharge: 2017-08-20 | Disposition: A | Discharge status: Home / Self Care | Payer: BLUE CROSS/BLUE SHIELD | Source: Ambulatory Visit | Attending: Internal Medicine | Admitting: Internal Medicine

## 2017-08-20 DIAGNOSIS — R55 Syncope and collapse: Secondary | ICD-10-CM | POA: Diagnosis not present

## 2017-08-20 DIAGNOSIS — Z1231 Encounter for screening mammogram for malignant neoplasm of breast: Secondary | ICD-10-CM

## 2017-08-20 DIAGNOSIS — I10 Essential (primary) hypertension: Secondary | ICD-10-CM | POA: Diagnosis not present

## 2017-08-20 DIAGNOSIS — E119 Type 2 diabetes mellitus without complications: Secondary | ICD-10-CM | POA: Diagnosis not present

## 2017-08-20 MED ORDER — LEVOTHYROXINE SODIUM 100 MCG PO TABS: 100.0000 ug | ORAL_TABLET | Freq: Every day | ORAL | 0 refills | Status: DC

## 2017-08-20 NOTE — Telephone Encounter (Signed)
[facility] needs [facility] [phone] soon as possible for the levothyroxine

## 2017-08-20 NOTE — Telephone Encounter (Signed)
Med sent.

## 2017-08-20 NOTE — Addendum Note (Signed)
Addended by: Drucilla Schmidt on: 08/20/2017 09:14 AM   Modules accepted: Orders

## 2017-08-24 DIAGNOSIS — I10 Essential (primary) hypertension: Secondary | ICD-10-CM | POA: Diagnosis not present

## 2017-08-25 ENCOUNTER — Other Ambulatory Visit: Payer: Self-pay | Admitting: Internal Medicine

## 2017-08-25 ENCOUNTER — Ambulatory Visit
Admission: RE | Admit: 2017-08-25 | Discharge: 2017-08-25 | Disposition: A | Discharge status: Home / Self Care | Payer: BLUE CROSS/BLUE SHIELD | Source: Ambulatory Visit | Attending: Internal Medicine | Admitting: Internal Medicine

## 2017-08-25 DIAGNOSIS — I10 Essential (primary) hypertension: Secondary | ICD-10-CM

## 2017-08-26 ENCOUNTER — Ambulatory Visit
Admission: RE | Admit: 2017-08-26 | Discharge: 2017-08-26 | Disposition: A | Discharge status: Home / Self Care | Payer: BLUE CROSS/BLUE SHIELD | Source: Ambulatory Visit | Attending: Internal Medicine | Admitting: Internal Medicine

## 2017-08-26 DIAGNOSIS — R93422 Abnormal radiologic findings on diagnostic imaging of left kidney: Secondary | ICD-10-CM | POA: Insufficient documentation

## 2017-08-26 DIAGNOSIS — R93421 Abnormal radiologic findings on diagnostic imaging of right kidney: Secondary | ICD-10-CM | POA: Diagnosis not present

## 2017-08-26 DIAGNOSIS — I10 Essential (primary) hypertension: Secondary | ICD-10-CM | POA: Diagnosis not present

## 2017-08-26 DIAGNOSIS — I150 Renovascular hypertension: Secondary | ICD-10-CM | POA: Diagnosis not present

## 2017-08-31 DIAGNOSIS — R5381 Other malaise: Secondary | ICD-10-CM | POA: Diagnosis not present

## 2017-08-31 DIAGNOSIS — I10 Essential (primary) hypertension: Secondary | ICD-10-CM | POA: Diagnosis not present

## 2017-08-31 DIAGNOSIS — Z79899 Other long term (current) drug therapy: Secondary | ICD-10-CM | POA: Diagnosis not present

## 2017-08-31 DIAGNOSIS — E079 Disorder of thyroid, unspecified: Secondary | ICD-10-CM | POA: Diagnosis not present

## 2017-08-31 DIAGNOSIS — R5383 Other fatigue: Secondary | ICD-10-CM | POA: Diagnosis not present

## 2017-08-31 DIAGNOSIS — E119 Type 2 diabetes mellitus without complications: Secondary | ICD-10-CM | POA: Diagnosis not present

## 2017-09-04 DIAGNOSIS — G4733 Obstructive sleep apnea (adult) (pediatric): Secondary | ICD-10-CM | POA: Diagnosis not present

## 2017-09-29 DIAGNOSIS — R350 Frequency of micturition: Secondary | ICD-10-CM | POA: Diagnosis not present

## 2017-09-29 DIAGNOSIS — R8290 Unspecified abnormal findings in urine: Secondary | ICD-10-CM | POA: Diagnosis not present

## 2017-10-05 DIAGNOSIS — G4733 Obstructive sleep apnea (adult) (pediatric): Secondary | ICD-10-CM | POA: Diagnosis not present

## 2017-10-11 DIAGNOSIS — G4733 Obstructive sleep apnea (adult) (pediatric): Secondary | ICD-10-CM | POA: Diagnosis not present

## 2017-10-19 DIAGNOSIS — I10 Essential (primary) hypertension: Secondary | ICD-10-CM | POA: Diagnosis not present

## 2017-10-19 DIAGNOSIS — E079 Disorder of thyroid, unspecified: Secondary | ICD-10-CM | POA: Diagnosis not present

## 2017-10-19 DIAGNOSIS — E119 Type 2 diabetes mellitus without complications: Secondary | ICD-10-CM | POA: Diagnosis not present

## 2017-11-04 DIAGNOSIS — G4733 Obstructive sleep apnea (adult) (pediatric): Secondary | ICD-10-CM | POA: Diagnosis not present

## 2017-11-15 DIAGNOSIS — Z0000 Encounter for general adult medical examination without abnormal findings: Secondary | ICD-10-CM | POA: Diagnosis not present

## 2017-11-15 DIAGNOSIS — I10 Essential (primary) hypertension: Secondary | ICD-10-CM | POA: Diagnosis not present

## 2017-11-15 DIAGNOSIS — K2270 Barrett's esophagus without dysplasia: Secondary | ICD-10-CM | POA: Diagnosis not present

## 2017-11-15 DIAGNOSIS — E119 Type 2 diabetes mellitus without complications: Secondary | ICD-10-CM | POA: Diagnosis not present

## 2017-12-13 DIAGNOSIS — E7800 Pure hypercholesterolemia, unspecified: Secondary | ICD-10-CM | POA: Diagnosis not present

## 2017-12-13 DIAGNOSIS — Z79899 Other long term (current) drug therapy: Secondary | ICD-10-CM | POA: Diagnosis not present

## 2017-12-13 DIAGNOSIS — E119 Type 2 diabetes mellitus without complications: Secondary | ICD-10-CM | POA: Diagnosis not present

## 2017-12-13 DIAGNOSIS — I10 Essential (primary) hypertension: Secondary | ICD-10-CM | POA: Diagnosis not present

## 2017-12-20 DIAGNOSIS — E039 Hypothyroidism, unspecified: Secondary | ICD-10-CM | POA: Diagnosis not present

## 2017-12-20 DIAGNOSIS — E119 Type 2 diabetes mellitus without complications: Secondary | ICD-10-CM | POA: Diagnosis not present

## 2017-12-21 DIAGNOSIS — K210 Gastro-esophageal reflux disease with esophagitis: Secondary | ICD-10-CM | POA: Diagnosis not present

## 2017-12-21 DIAGNOSIS — D369 Benign neoplasm, unspecified site: Secondary | ICD-10-CM | POA: Diagnosis not present

## 2018-02-03 DIAGNOSIS — Z01419 Encounter for gynecological examination (general) (routine) without abnormal findings: Secondary | ICD-10-CM | POA: Diagnosis not present

## 2018-04-18 DIAGNOSIS — G4733 Obstructive sleep apnea (adult) (pediatric): Secondary | ICD-10-CM | POA: Diagnosis not present

## 2018-05-11 DIAGNOSIS — D2261 Melanocytic nevi of right upper limb, including shoulder: Secondary | ICD-10-CM | POA: Diagnosis not present

## 2018-05-11 DIAGNOSIS — D2272 Melanocytic nevi of left lower limb, including hip: Secondary | ICD-10-CM | POA: Diagnosis not present

## 2018-05-11 DIAGNOSIS — Z85828 Personal history of other malignant neoplasm of skin: Secondary | ICD-10-CM | POA: Diagnosis not present

## 2018-05-11 DIAGNOSIS — D2262 Melanocytic nevi of left upper limb, including shoulder: Secondary | ICD-10-CM | POA: Diagnosis not present

## 2018-05-17 DIAGNOSIS — E079 Disorder of thyroid, unspecified: Secondary | ICD-10-CM | POA: Diagnosis not present

## 2018-05-17 DIAGNOSIS — Z79899 Other long term (current) drug therapy: Secondary | ICD-10-CM | POA: Diagnosis not present

## 2018-05-17 DIAGNOSIS — I10 Essential (primary) hypertension: Secondary | ICD-10-CM | POA: Diagnosis not present

## 2018-05-17 DIAGNOSIS — E119 Type 2 diabetes mellitus without complications: Secondary | ICD-10-CM | POA: Diagnosis not present

## 2018-06-14 DIAGNOSIS — J40 Bronchitis, not specified as acute or chronic: Secondary | ICD-10-CM | POA: Diagnosis not present

## 2018-06-14 DIAGNOSIS — E119 Type 2 diabetes mellitus without complications: Secondary | ICD-10-CM | POA: Diagnosis not present

## 2018-07-21 ENCOUNTER — Ambulatory Visit: Payer: BLUE CROSS/BLUE SHIELD | Admitting: Internal Medicine

## 2018-07-21 ENCOUNTER — Encounter: Payer: Self-pay | Admitting: Internal Medicine

## 2018-07-21 VITALS — BP 120/60 | HR 74 | Ht 65.0 in | Wt 197.0 lb

## 2018-07-21 DIAGNOSIS — E063 Autoimmune thyroiditis: Secondary | ICD-10-CM

## 2018-07-21 DIAGNOSIS — E1165 Type 2 diabetes mellitus with hyperglycemia: Secondary | ICD-10-CM | POA: Diagnosis not present

## 2018-07-21 DIAGNOSIS — E038 Other specified hypothyroidism: Secondary | ICD-10-CM | POA: Diagnosis not present

## 2018-07-21 LAB — TSH: TSH: 1.68 u[IU]/mL (ref 0.35–4.50)

## 2018-07-21 LAB — T4, FREE: FREE T4: 1.02 ng/dL (ref 0.60–1.60)

## 2018-07-21 NOTE — Progress Notes (Signed)
Patient ID: Rebecca Kramer, female   DOB: 10/25/1945, 73 y.o.   MRN: [id number]    HPI  Rebecca Kramer is a 73 y.o.-year-old female, initially referred by her PCP, Dr.Tower, returning for f/u for Hashimoto hypothyroidism. She was seeing Dr Sharol Roussel [facility]) >> stopped seeing her 2/2 cost.  Last visit 1 year ago.  She had a TIA 1 year ago - because of increased BP. She was started on Losartan, Amlodipine, ASA (but stopped b/c broken blood vessels in hands)  Pt. has been dx with hypothyroidism"many years ago" >> she was previously on: -Generic levothyroxine -Brand names -Then, Naturethroid 81.5 mg + Cytomel 5 mg bid (equivalent to ~180 mcg LT4)  -Currently back on generic levothyroxine  Pt is on levothyroxine 100 Mcg daily, taken: - in am - fasting - At least 30 minutes from coffee - at least 30 min from b'fast - no Fe - + Protonix more than 4 hours later (lunchtime) - + calcium and multivitamins - did not move - not on Biotin  Reviewed patient's TFTs: Lab Results  Component Value Date   TSH 1.24 08/19/2017   TSH 1.52 04/28/2016   TSH 2.02 01/16/2016   TSH 1.97 12/05/2015   TSH 1.27 11/19/2015   TSH 0.43 01/10/2015   TSH 1.12 11/26/2014   TSH 0.79 07/23/2014   TSH 1.04 05/08/2014   TSH 0.12 (L) 10/07/2012   FREET4 0.99 08/19/2017   FREET4 0.88 04/28/2016   FREET4 0.86 01/16/2016   FREET4 1.02 11/19/2015   FREET4 1.33 01/10/2015   FREET4 1.23 07/23/2014   FREET4 1.02 05/08/2014   FREET4 1.4 10/08/2008  05/05/2017: TSH 1.233 total T4 9.4 11/02/2016: TSH 0.435, total T4 11.6  03/14/2014: ATA 313 (<2) TPO Abs 39 (<9) TSH 3.552 (0.35-4.5), free T4 0.82 (0.8-1.8), Free T3 2.6 (2.3-4.2), reverse T3 12 (8-25) At the same time, she hadn't hemoglobin A1c 6.3, fasting insulin 16.6, vitamin D 66  Pt denies: - feeling nodules in neck - hoarseness - dysphagia - choking - SOB with lying down  No FH of thyroid ds.No FH of thyroid cancer. No h/o radiation tx to  head or neck.  No herbal supplements. No Biotin use. No recent steroids use.   Was exercising at [employer] - not in last 6 weeks. Husband recently had sx for Pr CA.Marland Kitchen  She also has DM2- and she would also want me to address this - since 2016  Latest HbA1c: 6.8% per her recall.  Previously: Lab Results  Component Value Date   HGBA1C 6.5 08/28/2016   HGBA1C 6.4 12/05/2015   HGBA1C 6.6 (H) 08/27/2015   HGBA1C 6.4 06/04/2015   HGBA1C 6.8 (H) 02/20/2015   She is on: - Metformin ER 1000 mg daily with lunch  She is not checking sugars at home.  No CKD: Lab Results  Component Value Date   BUN 20 08/28/2016   BUN 16 12/05/2015   Lab Results  Component Value Date   CREATININE 0.83 08/28/2016   CREATININE 0.82 12/05/2015  On Losartan  + HL: Lab Results  Component Value Date   CHOL 195 08/28/2016   HDL 51.30 08/28/2016   LDLCALC 118 (H) 08/28/2016   LDLDIRECT 129.6 07/17/2011   TRIG 130.0 08/28/2016   CHOLHDL 4 08/28/2016  She is not on a statin.  ROS: Constitutional: no weight gain/no weight loss, no fatigue, no subjective hyperthermia, no subjective hypothermia Eyes: no blurry vision, no xerophthalmia ENT: no sore throat, + see HPI Cardiovascular: no CP/no  SOB/no palpitations/no leg swelling Respiratory: no cough/no SOB/no wheezing Gastrointestinal: no N/no V/no D/no C/no acid reflux Musculoskeletal: no muscle aches/no joint aches Skin: no rashes, no hair loss Neurological: no tremors/no numbness/no tingling/no dizziness  I reviewed pt's medications, allergies, PMH, social hx, family hx, and changes were documented in the history of present illness. Otherwise, unchanged from my initial visit note.  Past Medical History:  Diagnosis Date  . Arthritis   . Barrett's esophagus   . Bone spur    Left foot / posterior  . Diabetes mellitus without complication ([place])   . Diverticulosis   . GERD (gastroesophageal reflux disease)    esophagitis with ulceration  .  Hypertension   . Hypothyroidism   . Menopausal symptoms    hot flashes  . Osteopenia   . Sleep apnea 2007   AHI 45/hr in 2007   Past Surgical History:  Procedure Laterality Date  . ABDOMINAL HYSTERECTOMY  1983   partial/endometriosis  . BREAST SURGERY  1970's   breast lumpectomy/benign  . BREAST SURGERY  1994   breast implants/saline  . COLONOSCOPY WITH PROPOFOL N/A 04/20/2017   Procedure: COLONOSCOPY WITH PROPOFOL;  Surgeon: Lollie Sails, MD;  Location: [facility] ENDOSCOPY;  Service: Endoscopy;  Laterality: N/A;  . COLONOSCOPY WITH PROPOFOL N/A 06/17/2017   Procedure: COLONOSCOPY WITH PROPOFOL;  Surgeon: Lollie Sails, MD;  Location: [facility] ENDOSCOPY;  Service: Endoscopy;  Laterality: N/A;  . DORSAL COMPARTMENT RELEASE  05/14/2011   Procedure: RELEASE DORSAL COMPARTMENT (DEQUERVAIN);  Surgeon: Cammie Sickle., MD;  Location: [facility];  Service: Orthopedics;  Laterality: Right;  right release dorsal compartment/ de quervain release with debridement of mucoid cyst  . ESOPHAGOGASTRODUODENOSCOPY  03/2008   esophagitis with ulcerations/?barretts  . ESOPHAGOGASTRODUODENOSCOPY (EGD) WITH PROPOFOL N/A 04/20/2017   Procedure: ESOPHAGOGASTRODUODENOSCOPY (EGD) WITH PROPOFOL;  Surgeon: Lollie Sails, MD;  Location: [facility] ENDOSCOPY;  Service: Endoscopy;  Laterality: N/A;  . ESOPHAGOGASTRODUODENOSCOPY (EGD) WITH PROPOFOL  06/17/2017   Procedure: ESOPHAGOGASTRODUODENOSCOPY (EGD) WITH PROPOFOL;  Surgeon: Lollie Sails, MD;  Location: [facility] ENDOSCOPY;  Service: Endoscopy;;  . thumb nodule     removed  . TONSILLECTOMY     History   Social History  . Marital Status: Married    Spouse Name: N/A    Number of Children: 2   Occupational History  . retired   Social History Main Topics  . Smoking status: Never Smoker   . Smokeless tobacco: Never Used  . Alcohol Use: No  . Drug Use: No   Current Outpatient Medications on File Prior to Visit  Medication Sig  Dispense Refill  . levothyroxine (SYNTHROID, LEVOTHROID) 100 MCG tablet Take 1 tablet (100 mcg total) by mouth daily before breakfast. 90 tablet 0  . losartan (COZAAR) 50 MG tablet Take 1 tablet (50 mg total) by mouth daily. 90 tablet 1  . magnesium oxide (MAG-OX) 400 MG tablet Take 400 mg by mouth daily.    . metFORMIN (GLUCOPHAGE-XR) 500 MG 24 hr tablet Take 2 tablets (1,000 mg total) by mouth daily with breakfast. 180 tablet 1  . Multiple Vitamin (MULTIVITAMIN) tablet Take 1 tablet by mouth daily.    Marland Kitchen neomycin-polymyxin-hydrocortisone (CORTISPORIN) OTIC solution 3 drops 4 (four) times daily.     No current facility-administered medications on file prior to visit.    Allergies  Allergen Reactions  . Benzonatate     Hot flash  . Dilaudid [Hydromorphone Hcl] Other (See Comments)    Face turned red; bp  Family History  Problem Relation Age of Onset  . Hypertension Mother   . Hyperlipidemia Mother   . Colon polyps Mother   . Heart disease Mother   . Migraines Mother   . Cancer Father        lung CA  . Migraines Brother   . Cancer Maternal Aunt        breast CA  . Breast cancer Maternal Aunt    PE: BP 120/60   Pulse 74   Ht 5\' 5"  (1.651 m)   Wt 197 lb (89.4 kg)   SpO2 98%   BMI 32.78 kg/m  Wt Readings from Last 3 Encounters:  07/21/18 197 lb (89.4 kg)  07/20/17 190 lb (86.2 kg)  06/17/17 180 lb (81.6 kg)   Constitutional: overweight, in NAD Eyes: PERRLA, EOMI, no exophthalmos ENT: moist mucous membranes, no thyromegaly, no cervical lymphadenopathy Cardiovascular: RRR, No MRG Respiratory: CTA B Gastrointestinal: abdomen soft, NT, ND, BS+ Musculoskeletal: no deformities, strength intact in all 4 Skin: moist, warm, no rashes Neurological: no tremor with outstretched hands, DTR normal in all 4  ASSESSMENT: 1. Hypothyroidism -Due to Hashimoto's thyroiditis  2. DM2, non-insulin-dependent  PLAN:  1. Patient with longstanding Hashimoto's hypothyroidism, on  levothyroxine therapy. - latest thyroid labs reviewed with pt >> normal 08/2017. - she continues on LT4 100 Mcg daily - pt feels good on this dose, with only slight fatigue. - we discussed about taking the thyroid hormone every day, with water, >30 minutes before breakfast, separated by >4 hours from acid reflux medications, calcium, iron, multivitamins. Pt. is taking it correctly.  At last visit, multivitamins and calcium was too close to levothyroxine so I advised her to move them at least 4 hours later. She did not do so >> now 2h later >> again advised to move them >4h later - will check thyroid tests today: TSH and fT4 - If labs are abnormal, she will need to return for repeat TFTs in 1.5 months  2.  DM2, non-insulin-dependent -New problem for me -Most recent HbA1c was higher, at 6.8%, but still at goal -I advised her to start checking sugars at different times of the day - check 1x a day, rotating checks -Given blood sugar log and advised her when to check -We discussed about targets for sugars before meals: 90-1 30 and after meals: Less than 180 -For now continue metformin ER 1000 mg daily but most days with dinner -Given handout about foot care and also hypoglycemia correction. -I will see her back in 4 months.   Office Visit on 07/21/2018  Component Date Value Ref Range Status  . TSH 07/21/2018 1.68  0.35 - 4.50 uIU/mL Final  . Free T4 07/21/2018 1.02  0.60 - 1.60 ng/dL Final   Comment: Specimens from patients who are undergoing biotin therapy and /or ingesting biotin supplements may contain high levels of biotin.  The higher biotin concentration in these specimens interferes with this Free T4 assay.  Specimens that contain high levels  of biotin may cause false high results for this Free T4 assay.  Please interpret results in light of the total clinical presentation of the patient.     Normal TFTs.  Philemon Kingdom, MD PhD [facility] Endocrinology

## 2018-07-21 NOTE — Patient Instructions (Addendum)
Please continue Levothyroxine 100 mcg daily.  Take the thyroid hormone every day, with water, at least 30 minutes before breakfast, separated by at least 4 hours from: - acid reflux medications - calcium - iron - multivitamins  Please move the multivitamin sand the calcium >4h after Levothyroxine.  Move Metformin ER 1000 mg to dinnertime.  Please stop at the lab.  Please start checking sugars 1x a day.  Please return in 4 months.

## 2018-07-22 ENCOUNTER — Other Ambulatory Visit: Payer: Self-pay | Admitting: Internal Medicine

## 2018-07-22 DIAGNOSIS — Z1231 Encounter for screening mammogram for malignant neoplasm of breast: Secondary | ICD-10-CM

## 2018-08-23 ENCOUNTER — Ambulatory Visit
Admission: RE | Admit: 2018-08-23 | Discharge: 2018-08-23 | Disposition: A | Discharge status: Home / Self Care | Payer: BLUE CROSS/BLUE SHIELD | Source: Ambulatory Visit | Attending: Internal Medicine | Admitting: Internal Medicine

## 2018-08-23 DIAGNOSIS — Z1231 Encounter for screening mammogram for malignant neoplasm of breast: Secondary | ICD-10-CM

## 2018-09-27 DIAGNOSIS — G4733 Obstructive sleep apnea (adult) (pediatric): Secondary | ICD-10-CM | POA: Diagnosis not present

## 2018-11-15 DIAGNOSIS — E7800 Pure hypercholesterolemia, unspecified: Secondary | ICD-10-CM | POA: Diagnosis not present

## 2018-11-15 DIAGNOSIS — Z79899 Other long term (current) drug therapy: Secondary | ICD-10-CM | POA: Diagnosis not present

## 2018-11-15 DIAGNOSIS — E119 Type 2 diabetes mellitus without complications: Secondary | ICD-10-CM | POA: Diagnosis not present

## 2018-11-17 DIAGNOSIS — K210 Gastro-esophageal reflux disease with esophagitis: Secondary | ICD-10-CM | POA: Diagnosis not present

## 2018-11-17 DIAGNOSIS — K2270 Barrett's esophagus without dysplasia: Secondary | ICD-10-CM | POA: Diagnosis not present

## 2018-11-17 DIAGNOSIS — R5383 Other fatigue: Secondary | ICD-10-CM | POA: Diagnosis not present

## 2018-11-17 DIAGNOSIS — R5381 Other malaise: Secondary | ICD-10-CM | POA: Diagnosis not present

## 2018-11-17 DIAGNOSIS — Z79899 Other long term (current) drug therapy: Secondary | ICD-10-CM | POA: Diagnosis not present

## 2018-11-17 DIAGNOSIS — Z0000 Encounter for general adult medical examination without abnormal findings: Secondary | ICD-10-CM | POA: Diagnosis not present

## 2018-11-17 DIAGNOSIS — I10 Essential (primary) hypertension: Secondary | ICD-10-CM | POA: Diagnosis not present

## 2018-11-23 DIAGNOSIS — M8588 Other specified disorders of bone density and structure, other site: Secondary | ICD-10-CM | POA: Diagnosis not present

## 2018-11-23 DIAGNOSIS — R29898 Other symptoms and signs involving the musculoskeletal system: Secondary | ICD-10-CM | POA: Diagnosis not present

## 2018-11-24 ENCOUNTER — Other Ambulatory Visit: Payer: Self-pay

## 2018-11-24 ENCOUNTER — Encounter: Payer: Self-pay | Admitting: Internal Medicine

## 2018-11-24 ENCOUNTER — Telehealth: Payer: Self-pay | Admitting: Internal Medicine

## 2018-11-24 ENCOUNTER — Ambulatory Visit (INDEPENDENT_AMBULATORY_CARE_PROVIDER_SITE_OTHER): Payer: BLUE CROSS/BLUE SHIELD | Admitting: Internal Medicine

## 2018-11-24 DIAGNOSIS — E038 Other specified hypothyroidism: Secondary | ICD-10-CM

## 2018-11-24 DIAGNOSIS — E119 Type 2 diabetes mellitus without complications: Secondary | ICD-10-CM

## 2018-11-24 DIAGNOSIS — E7800 Pure hypercholesterolemia, unspecified: Secondary | ICD-10-CM

## 2018-11-24 DIAGNOSIS — E063 Autoimmune thyroiditis: Secondary | ICD-10-CM | POA: Diagnosis not present

## 2018-11-24 NOTE — Telephone Encounter (Signed)
I only just got the fax.  Reports given to Dr. Cruzita Lederer.

## 2018-11-24 NOTE — Progress Notes (Signed)
Patient ID: Rebecca Kramer, female   DOB: 05-14-1946, 73 y.o.   MRN: [id number]   Patient location: Home My location: Office  Referring Provider: Dr. Loura Pardon  I connected with the patient on 11/24/18 at 10:44 AM by a video enabled telemedicine application and verified that I am speaking with the correct person.   I discussed the limitations of evaluation and management by telemedicine and the availability of in person appointments. The patient expressed understanding and agreed to proceed.   Details of the encounter are shown below.  HPI  Rebecca Kramer is a 73 y.o.-year-old female, initially referred by her PCP, Dr.Tower, presenting for f/u for Hashimoto hypothyroidism and DM2, non-insulin-dependent, without long-term complications. She was seeing Dr Sharol Roussel [facility]) >> stopped seeing her 2/2 cost.  Last visit 4 months ago.  She started to walk more >> sugars are better after walks.  Hypothyroidism: - dx'ed "many years ago"   She was previously on: -Generic levothyroxine -Brand names -Then, Naturethroid 81.5 mg + Cytomel 5 mg bid (equivalent to ~180 mcg LT4)  -Currently back on generic levothyroxine  Pt is on levothyroxine 100 mcg daily, taken: - in am - fasting - at least 30 min from b'fast and from coffee - no Fe - + PPIs with lunch - + calcium and MVI >4h later - not on Biotin  Reviewed patient's TFTs: 11/15/2018: 1.086 Lab Results  Component Value Date   TSH 1.68 07/21/2018   TSH 1.24 08/19/2017   TSH 1.52 04/28/2016   TSH 2.02 01/16/2016   TSH 1.97 12/05/2015   TSH 1.27 11/19/2015   TSH 0.43 01/10/2015   TSH 1.12 11/26/2014   TSH 0.79 07/23/2014   TSH 1.04 05/08/2014   FREET4 1.02 07/21/2018   FREET4 0.99 08/19/2017   FREET4 0.88 04/28/2016   FREET4 0.86 01/16/2016   FREET4 1.02 11/19/2015   FREET4 1.33 01/10/2015   FREET4 1.23 07/23/2014   FREET4 1.02 05/08/2014   FREET4 1.4 10/08/2008  05/05/2017: TSH 1.233 total T4  9.4 11/02/2016: TSH 0.435, total T4 11.6  03/14/2014: ATA 313 (<2) TPO Abs 39 (<9) TSH 3.552 (0.35-4.5), free T4 0.82 (0.8-1.8), Free T3 2.6 (2.3-4.2), reverse T3 12 (8-25) At the same time, she hadn't hemoglobin A1c 6.3, fasting insulin 16.6, vitamin D 66  Pt denies: - feeling nodules in neck - hoarseness - dysphagia - choking - SOB with lying down  No FH of thyroid ds.No FH of thyroid cancer. No h/o radiation tx to head or neck.  No seaweed or kelp. No recent contrast studies. No herbal supplements. No Biotin use. No recent steroids use.   Was exercising at [employer] -  not recently.   Husband has prostate cancer.  DM2: -Diagnosed in 2016  Reviewed HbA1c levels: 11/15/2018: HbA1c 6.8% 05/17/2018: HbA1c 6.9% Lab Results  Component Value Date   HGBA1C 6.5 08/28/2016   HGBA1C 6.4 12/05/2015   HGBA1C 6.6 (H) 08/27/2015   HGBA1C 6.4 06/04/2015   HGBA1C 6.8 (H) 02/20/2015   She is on: - Metformin ER 1000 mg daily after lunch or dinner  She checks sugars 1x a day: - am: 130-152, 167 - 2h after b'fast: n/c - lunch: 79 - 2h after lunch: n/c - dinner: 106-132, 166 - 2h after dinner: 103-169, 175, 194 - bedtime: 104 Lowest: 79 Highest: 194  No CKD: 11/15/2018: Glucose 122, BUN/Cr 18/0.8 Lab Results  Component Value Date   BUN 20 08/28/2016   BUN 16 12/05/2015   Lab Results  Component Value Date   CREATININE 0.83 08/28/2016   CREATININE 0.82 12/05/2015  On losartan.  + HL: 11/15/2018: 185/144/46.5/106 Lab Results  Component Value Date   CHOL 195 08/28/2016   HDL 51.30 08/28/2016   LDLCALC 118 (H) 08/28/2016   LDLDIRECT 129.6 07/17/2011   TRIG 130.0 08/28/2016   CHOLHDL 4 08/28/2016  She is not on a statin.  Last eye exam: Spring 2019: no DR.   ROS: Constitutional: no weight gain/no weight loss, no fatigue, no subjective hyperthermia, no subjective hypothermia Eyes: no blurry vision, no xerophthalmia ENT: no sore throat,  + see  HPI Cardiovascular: no CP/no SOB/no palpitations/no leg swelling Respiratory: no cough/no SOB/no wheezing Gastrointestinal: no N/no V/no D/no C/no acid reflux Musculoskeletal: no muscle aches/no joint aches Skin: no rashes, no hair loss Neurological: no tremors/no numbness/no tingling/no dizziness  I reviewed pt's medications, allergies, PMH, social hx, family hx, and changes were documented in the history of present illness. Otherwise, unchanged from my initial visit note.  Past Medical History:  Diagnosis Date  . Arthritis   . Barrett's esophagus   . Bone spur    Left foot / posterior  . Diabetes mellitus without complication ([place])   . Diverticulosis   . GERD (gastroesophageal reflux disease)    esophagitis with ulceration  . Hypertension   . Hypothyroidism   . Menopausal symptoms    hot flashes  . Osteopenia   . Sleep apnea 2007   AHI 45/hr in 2007   Past Surgical History:  Procedure Laterality Date  . ABDOMINAL HYSTERECTOMY  1983   partial/endometriosis  . BREAST EXCISIONAL BIOPSY Right   . BREAST SURGERY  1970's   breast lumpectomy/benign  . BREAST SURGERY  1994   breast implants/saline  . COLONOSCOPY WITH PROPOFOL N/A 04/20/2017   Procedure: COLONOSCOPY WITH PROPOFOL;  Surgeon: Lollie Sails, MD;  Location: [facility] ENDOSCOPY;  Service: Endoscopy;  Laterality: N/A;  . COLONOSCOPY WITH PROPOFOL N/A 06/17/2017   Procedure: COLONOSCOPY WITH PROPOFOL;  Surgeon: Lollie Sails, MD;  Location: [facility] ENDOSCOPY;  Service: Endoscopy;  Laterality: N/A;  . DORSAL COMPARTMENT RELEASE  05/14/2011   Procedure: RELEASE DORSAL COMPARTMENT (DEQUERVAIN);  Surgeon: Cammie Sickle., MD;  Location: [facility];  Service: Orthopedics;  Laterality: Right;  right release dorsal compartment/ de quervain release with debridement of mucoid cyst  . ESOPHAGOGASTRODUODENOSCOPY  03/2008   esophagitis with ulcerations/?barretts  . ESOPHAGOGASTRODUODENOSCOPY (EGD) WITH PROPOFOL  N/A 04/20/2017   Procedure: ESOPHAGOGASTRODUODENOSCOPY (EGD) WITH PROPOFOL;  Surgeon: Lollie Sails, MD;  Location: [facility] ENDOSCOPY;  Service: Endoscopy;  Laterality: N/A;  . ESOPHAGOGASTRODUODENOSCOPY (EGD) WITH PROPOFOL  06/17/2017   Procedure: ESOPHAGOGASTRODUODENOSCOPY (EGD) WITH PROPOFOL;  Surgeon: Lollie Sails, MD;  Location: [facility] ENDOSCOPY;  Service: Endoscopy;;  . thumb nodule     removed  . TONSILLECTOMY     History   Social History  . Marital Status: Married    Spouse Name: N/A    Number of Children: 2   Occupational History  . retired   Social History Main Topics  . Smoking status: Never Smoker   . Smokeless tobacco: Never Used  . Alcohol Use: No  . Drug Use: No   Current Outpatient Medications on File Prior to Visit  Medication Sig Dispense Refill  . amLODipine (NORVASC) 5 MG tablet Take 5 mg by mouth daily.    Marland Kitchen levothyroxine (SYNTHROID, LEVOTHROID) 100 MCG tablet Take 1 tablet (100 mcg total) by mouth daily before breakfast. 90 tablet 0  .  losartan (COZAAR) 50 MG tablet Take 1 tablet (50 mg total) by mouth daily. 90 tablet 1  . metFORMIN (GLUCOPHAGE-XR) 500 MG 24 hr tablet Take 2 tablets (1,000 mg total) by mouth daily with breakfast. 180 tablet 1  . Multiple Vitamin (MULTIVITAMIN) tablet Take 1 tablet by mouth daily.    Marland Kitchen neomycin-polymyxin-hydrocortisone (CORTISPORIN) OTIC solution 3 drops 4 (four) times daily.    . pantoprazole (PROTONIX) 40 MG tablet TAKE 1 TABLET ONCE DAILY.  TAKE 30 TO 45 MINUTES BEFORE BREAKFAST     No current facility-administered medications on file prior to visit.    Allergies  Allergen Reactions  . Benzonatate     Hot flash  . Dilaudid [Hydromorphone Hcl] Other (See Comments)    Face turned red; bp   Family History  Problem Relation Age of Onset  . Hypertension Mother   . Hyperlipidemia Mother   . Colon polyps Mother   . Heart disease Mother   . Migraines Mother   . Cancer Father        lung CA  . Migraines  Brother   . Cancer Maternal Aunt        breast CA  . Breast cancer Maternal Aunt    PE: There were no vitals taken for this visit. Wt Readings from Last 3 Encounters:  07/21/18 197 lb (89.4 kg)  07/20/17 190 lb (86.2 kg)  06/17/17 180 lb (81.6 kg)   Constitutional:  in NAD  The physical exam was not performed (virtual visit).  ASSESSMENT: 1. Hypothyroidism -Due to Hashimoto's thyroiditis  2. DM2, non-insulin-dependent  3. HL  PLAN:  1. Patient with longstanding Hashimoto's hypothyroidism, on levothyroxine therapy - latest thyroid labs reviewed with pt >> excellent in 11/2018 - she continues on LT4 100 mcg daily - pt feels good on this dose. - we discussed about taking the thyroid hormone every day, with water, >30 minutes before breakfast, separated by >4 hours from acid reflux medications, calcium, iron, multivitamins. Pt. is taking it correctly now, previously she was taking the multivitamins only 2 hours from levothyroxine.  2.  DM2, non-insulin-dependent -Most recent HbA1c was 6.8% earlier this month, slightly improved compared to 6.9% at last visit -At last visit we discussed about starting checking sugars at different times of the day, once a day, rotating checks -She started to check blood sugars once a day rotating the check times.  Sugars are mostly at goal but slightly higher in the morning and with occasional hyperglycemic spikes later in the day -She continues on metformin ER 1000 daily, but since sugars in the morning are higher than target, we discussed about moving the dose consistently with dinner.  At next visit, we may need to increase the dose of metformin, but I do not absolutely feel we need to do so for now, since she also started walking as she started the sugar started to improve afterwards. -We will continue the current regimen and check another A1c when she returns to the clinic  - I will see her back in 4 months  3. HL -Reviewed latest lipid panel  from 11/2018: LDL improved -She continues off statins  Philemon Kingdom, MD PhD [facility]

## 2018-11-24 NOTE — Telephone Encounter (Signed)
Patient states she had labs drawn at a different office and faxed them to Dr. Cruzita Lederer yesterday afternoon (with confirmation sent to [phone] at 3:32 p.m.) and wants to know why Dr. Cruzita Lederer did not discuss labs during this morning's virtual visit. Please call patient at ph# [phone] to advise.

## 2018-11-24 NOTE — Patient Instructions (Signed)
Please continue Levothyroxine 100 mcg daily.  Take the thyroid hormone every day, with water, at least 30 minutes before breakfast, separated by at least 4 hours from: - acid reflux medications - calcium - iron - multivitamins  Please move the metformin 1000 mg with dinner  Please return in 4 months.

## 2018-11-24 NOTE — Telephone Encounter (Signed)
I received them.  We went over all of them during the appointment earlier today, and she had the copy with her.

## 2018-12-22 DIAGNOSIS — K210 Gastro-esophageal reflux disease with esophagitis: Secondary | ICD-10-CM | POA: Diagnosis not present

## 2018-12-30 DIAGNOSIS — G4733 Obstructive sleep apnea (adult) (pediatric): Secondary | ICD-10-CM | POA: Diagnosis not present

## 2019-02-07 DIAGNOSIS — Z01419 Encounter for gynecological examination (general) (routine) without abnormal findings: Secondary | ICD-10-CM | POA: Diagnosis not present

## 2019-02-07 DIAGNOSIS — N816 Rectocele: Secondary | ICD-10-CM | POA: Diagnosis not present

## 2019-03-21 ENCOUNTER — Other Ambulatory Visit: Payer: Self-pay

## 2019-03-21 ENCOUNTER — Ambulatory Visit: Payer: BC Managed Care – PPO | Attending: Obstetrics and Gynecology

## 2019-03-21 DIAGNOSIS — M6281 Muscle weakness (generalized): Secondary | ICD-10-CM | POA: Insufficient documentation

## 2019-03-21 DIAGNOSIS — R293 Abnormal posture: Secondary | ICD-10-CM | POA: Insufficient documentation

## 2019-03-21 NOTE — Therapy (Signed)
[place] MAIN [facility] SERVICES [address] [place], [place], [address] Phone: [phone]   Fax:  [phone]  Physical Therapy Evaluation  The patient has been informed of current processes in place at Outpatient Rehab to protect patients from Covid-19 exposure including social distancing, schedule modifications, and new cleaning procedures. After discussing their particular risk with a therapist based on the patient's personal risk factors, the patient has decided to proceed with in-person therapy.   Patient Details  Name: Rebecca Kramer MRN: [id number] Date of Birth: 12/26/1945 No data recorded  Encounter Date: 03/21/2019  PT End of Session - 03/21/19 1502    Visit Number  1    Number of Visits  10    Date for PT Re-Evaluation  05/30/19    Authorization Type  [employer]    Authorization - Visit Number  1    Authorization - Number of Visits  10    PT Start Time  [id number]    PT Stop Time  [id number]    PT Time Calculation (min)  60 min    Activity Tolerance  Patient tolerated treatment well;No increased pain    Behavior During Therapy  WFL for tasks assessed/performed       Past Medical History:  Diagnosis Date  . Arthritis   . Barrett's esophagus   . Bone spur    Left foot / posterior  . Diabetes mellitus without complication ([place])   . Diverticulosis   . GERD (gastroesophageal reflux disease)    esophagitis with ulceration  . Hypertension   . Hypothyroidism   . Menopausal symptoms    hot flashes  . Osteopenia   . Sleep apnea 2007   AHI 45/hr in 2007    Past Surgical History:  Procedure Laterality Date  . ABDOMINAL HYSTERECTOMY  1983   partial/endometriosis  . BREAST EXCISIONAL BIOPSY Right   . BREAST SURGERY  1970's   breast lumpectomy/benign  . BREAST SURGERY  1994   breast implants/saline  . COLONOSCOPY WITH PROPOFOL N/A 04/20/2017   Procedure: COLONOSCOPY WITH PROPOFOL;  Surgeon: Lollie Sails, MD;  Location: [facility]  ENDOSCOPY;  Service: Endoscopy;  Laterality: N/A;  . COLONOSCOPY WITH PROPOFOL N/A 06/17/2017   Procedure: COLONOSCOPY WITH PROPOFOL;  Surgeon: Lollie Sails, MD;  Location: [facility] ENDOSCOPY;  Service: Endoscopy;  Laterality: N/A;  . DORSAL COMPARTMENT RELEASE  05/14/2011   Procedure: RELEASE DORSAL COMPARTMENT (DEQUERVAIN);  Surgeon: Cammie Sickle., MD;  Location: [facility];  Service: Orthopedics;  Laterality: Right;  right release dorsal compartment/ de quervain release with debridement of mucoid cyst  . ESOPHAGOGASTRODUODENOSCOPY  03/2008   esophagitis with ulcerations/?barretts  . ESOPHAGOGASTRODUODENOSCOPY (EGD) WITH PROPOFOL N/A 04/20/2017   Procedure: ESOPHAGOGASTRODUODENOSCOPY (EGD) WITH PROPOFOL;  Surgeon: Lollie Sails, MD;  Location: [facility] ENDOSCOPY;  Service: Endoscopy;  Laterality: N/A;  . ESOPHAGOGASTRODUODENOSCOPY (EGD) WITH PROPOFOL  06/17/2017   Procedure: ESOPHAGOGASTRODUODENOSCOPY (EGD) WITH PROPOFOL;  Surgeon: Lollie Sails, MD;  Location: [facility] ENDOSCOPY;  Service: Endoscopy;;  . thumb nodule     removed  . TONSILLECTOMY      There were no vitals filed for this visit.    Pelvic Floor Physical Therapy Evaluation and Assessment  SCREENING  Falls in last 6 mo: none   Red Flags:  Have you had any night sweats? none Unexplained weight loss? none Saddle anesthesia? none Unexplained changes in bowel or bladder habits? none  SUBJECTIVE  Patient reports: Had what was likely a mini-stroke ~  2 years ago.   Precautions:  Sleep Apnea  Social/Family/Vocational History:   Retired, did [employer] sneakers before covid, walks ~ 3-4 times per week ~ 3 miles.  Partial hysterectomy due to endometriosis.  Recent Procedures/Tests/Findings:  none  Obstetrical History: Yes, 2 children vaginal deliveries. Thinks she had an episiotomy.  Gynecological History: Endometriosis and partial hysterectomy  Urinary History: No  leakage.  Gastrointestinal History: Having FI after having a BM ~ 1x/wk Has BM's 1-3 times grade 4-5  Sexual activity/pain: none  Location of pain: none  Patient Goals: To prevent her prolapse from worsening.   OBJECTIVE  Posture/Observations:  Sitting:  Standing: R PSIS slight high, R knee slight bend Supine: AIS on R just slightly long  Palpation/Segmental Motion/Joint Play: R Psoas in abdomen. No pain with PAIVM along thoracic but mild at lumbar and sacral base.  Special tests:   Leg-length: L 88cm, R 89cm  Range of Motion/Flexibilty:  Spine: L rot ~ 20% reduced compared to L Hips:   Strength/MMT:  LE MMT  LE MMT Left Right  Hip flex:  (L2) /5 /5  Hip ext: 5/5 5/5  Hip abd: 5/5 5/5  Hip add: 5/5 5/5  Hip IR 4+/5 4+/5  Hip ER 5/5 5/5     Abdominal:  Palpation: Diastasis: none visualized, not palpated  Pelvic Floor External Exam: Deferred to follow-up Introitus Appears:  Skin integrity:  Palpation: Cough: Prolapse visible?: Scar mobility:  Internal Vaginal Exam: Strength (PERF):  Symmetry: Palpation: Prolapse:   Internal Rectal Exam: Strength (PERF): Symmetry: Palpation: Prolapse:   Gait Analysis: Not assessed, grossly WNL  Pelvic Floor Outcome Measures: FISI: 34, PFDI:23/300, PFIQ: 20/300  INTERVENTIONS THIS SESSION: Self-care: Educated on the structure and function of the pelvic floor in relation to their symptoms as well as the POC, and initial HEP in order to set patient expectations and understanding from which we will build on in the future sessions. Therex: educated on how to perform seated pelvic tilts and diaphragmatic breathing to improve deep-core recruitment and PFM engagement.  Total time: 60 min.                 Objective measurements completed on examination: See above findings.                PT Short Term Goals - 03/21/19 1521      PT SHORT TERM GOAL #1   Title  Patient will  demonstrate a coordinated contraction, relaxation, and bulge of the pelvic floor muscles to demonstrate functional recruitment and motion and allow for further strengthening.    Baseline  Prolapse and Vaginal flatus indicating likely PFM weakness    Time  5    Period  Weeks    Status  New    Target Date  04/25/19      PT SHORT TERM GOAL #2   Title  Patient will demonstrate functional recruitment of TA with breathing, sit-to-stand, squatting/lifting, and walking to allow for improved pelvic brace coordination, improved balance, and decreased downward pressure on the pelvic organs.    Baseline  Pt. in anterior pelvic tilt and unaware of the role of exhale for deep-core recruitment.    Time  5    Period  Weeks    Status  New    Target Date  04/25/19      PT SHORT TERM GOAL #3   Title  Patient will demonstrate HEP x1 in the clinic to demonstrate understanding and proper form to allow for further improvement.  Baseline  Pt. lacks knowledge of what therapeutic exercises can help decrease her Sx.    Time  5    Period  Weeks    Status  New    Target Date  04/25/19        PT Long Term Goals - 03/21/19 1526      PT LONG TERM GOAL #1   Title  Patient will report no episodes of FI, GI or vaginal flatus over the course of the prior two weeks to demonstrate improved functional ability and PFM strength.    Baseline  having vaginal flatus daily and FI occasionally after completing BM if stool loose.    Time  10    Period  Weeks    Status  New    Target Date  05/30/19      PT LONG TERM GOAL #2   Title  Patient will demonstrate 4+/5 strength or better in the PF and surrounding musculature to show improved functional strength for vocational and recreational activities.    Baseline  TA 4/5, and suspected PFM weakness    Time  10    Period  Weeks    Status  New    Target Date  05/30/19      PT LONG TERM GOAL #3   Title  Patient will score at or below 15 on the FISI, 12/300 on the PFDI and  10/300 on the PFIQ to demonstrate a clinically meaningful decrease in disability and distress due to pelvic floor dysfunction.    Baseline  FISI: 34, PFDI:23/300, PFIQ: 20/300    Time  10    Period  Weeks    Status  New    Target Date  05/30/19             Plan - 03/21/19 1503    Clinical Impression Statement  Pt. is a 73 y/o female who presents today with cheif c/o rectocele, occasional FI, and vaginal flatus. Her PMH is significant for a partial hysterectomy and episiotomy with vaginal delivery, arthritis, sleep apnea, osteopenia, diabetes, and HBP. Her clinical assessment revealed a leg-length difference of 1 cm with the RLE long and compensated for with partial bent knee on R as well as decreased lumbar flexion mobility, TTP to R Psoas, mildly hyperkyphotic/lordotic, and decreased L thoracolumbar rotation ROM. Her history indicates likely PFM weakness with some internal scarring inhibiting PFM function, to be confirmed at follow-up appt. She will benefit from skilled pelvic PT to address the noted deficits and to continue to assess for and address other potential causes of Sx.    Personal Factors and Comorbidities  Comorbidity 3+;Age    Comorbidities  arthritis, sleep apnea, osteopenia, diabetes, and HBP    Examination-Activity Limitations  Continence;Toileting    Examination-Participation Restrictions  Interpersonal Relationship    Stability/Clinical Decision Making  Evolving/Moderate complexity    Clinical Decision Making  Moderate    Rehab Potential  Good    PT Frequency  1x / week    PT Duration  Other (comment)   10 weeks   PT Treatment/Interventions  ADLs/Self Care Home Management;Biofeedback;Electrical Stimulation;Therapeutic exercise;Therapeutic activities;Functional mobility training;Neuromuscular re-education;Patient/family education;Manual techniques;Scar mobilization;Passive range of motion;Dry needling;Joint Manipulations;Spinal Manipulations    PT Next Visit Plan  assess  PFM, train kegel?, soda can, exhale on exertion,etc.    PT Home Exercise Plan  pelvic tilt in seated    Consulted and Agree with Plan of Care  Patient       Patient will benefit from skilled  therapeutic intervention in order to improve the following deficits and impairments:  Postural dysfunction, Impaired flexibility, Decreased strength, Decreased range of motion, Increased muscle spasms, Decreased mobility, Improper body mechanics  Visit Diagnosis: Muscle weakness (generalized)  Abnormal posture     Problem List Patient Active Problem List   Diagnosis Date Noted  . Obesity (BMI 30.0-34.9) 09/02/2016  . Hypersomnia with sleep apnea 11/04/2015  . Acute eczematoid otitis externa of both ears 12/04/2014  . Routine general medical examination at a health care facility 11/30/2014  . Diabetes mellitus type 2, controlled, without complications ([place]) 0000000  . Obstructive sleep apnea 02/27/2010  . PURE HYPERCHOLESTEROLEMIA 02/05/2010  . ECZEMA 11/07/2009  . INFLAMED SEBORRHEIC KERATOSIS 10/29/2008  . Osteopenia 10/08/2008  . Hypothyroidism due to Hashimoto's thyroiditis 10/29/2006  . Essential hypertension 10/29/2006  . REFLUX, ESOPHAGEAL 10/29/2006   Rebecca Kramer DPT, ATC Rebecca Kramer 03/22/2019, 5:04 PM  [place] MAIN [facility] SERVICES [address] [place], [place], [address] Phone: [phone]   Fax:  [phone]  Name: Rebecca Kramer MRN: [id number] Date of Birth: Jan 14, 1946

## 2019-03-21 NOTE — Patient Instructions (Signed)
   Do 2 sets of 15 tilts per day. Breathe in when you tilt forward (A) and out when you tuck under (B).  

## 2019-03-24 ENCOUNTER — Other Ambulatory Visit: Payer: Self-pay

## 2019-03-27 ENCOUNTER — Ambulatory Visit: Payer: BC Managed Care – PPO

## 2019-03-28 ENCOUNTER — Encounter: Payer: Self-pay | Admitting: Internal Medicine

## 2019-03-28 ENCOUNTER — Ambulatory Visit: Payer: BC Managed Care – PPO | Admitting: Internal Medicine

## 2019-03-28 ENCOUNTER — Other Ambulatory Visit: Payer: Self-pay

## 2019-03-28 VITALS — BP 140/82 | HR 90 | Ht 65.0 in | Wt 194.0 lb

## 2019-03-28 DIAGNOSIS — E038 Other specified hypothyroidism: Secondary | ICD-10-CM | POA: Diagnosis not present

## 2019-03-28 DIAGNOSIS — E7800 Pure hypercholesterolemia, unspecified: Secondary | ICD-10-CM | POA: Diagnosis not present

## 2019-03-28 DIAGNOSIS — E119 Type 2 diabetes mellitus without complications: Secondary | ICD-10-CM | POA: Diagnosis not present

## 2019-03-28 DIAGNOSIS — E063 Autoimmune thyroiditis: Secondary | ICD-10-CM

## 2019-03-28 LAB — POCT GLYCOSYLATED HEMOGLOBIN (HGB A1C): Hemoglobin A1C: 6.4 % — AB (ref 4.0–5.6)

## 2019-03-28 NOTE — Progress Notes (Signed)
Patient ID: SARIAH GUE, female   DOB: 1945-10-16, 73 y.o.   MRN: [id number]   HPI  CHALEE KILLEEN is a 73 y.o.-year-old female, initially referred by her PCP, Dr.Tower, presenting for f/u for Hashimoto hypothyroidism and DM2, non-insulin-dependent, without long-term complications. She was seeing Dr Sharol Roussel [facility]) >> stopped seeing her 2/2 cost.  Last visit 4 months ago (virtual).  Hypothyroidism: - dx'ed "many years ago"  She was previously on: -Generic levothyroxine -Brand names -Then, Naturethroid 81.5 mg + Cytomel 5 mg bid (equivalent to ~180 mcg LT4)  -Currently back on generic levothyroxine  Pt is on levothyroxine 100 mcg daily, taken: - in am - fasting - at least 30 min from b'fast - + Ca, MVI, PPIs with lunch - no Fe - not on Biotin  Reviewed patient's TFTs: 11/15/2018: 1.086 Lab Results  Component Value Date   TSH 1.68 07/21/2018   TSH 1.24 08/19/2017   TSH 1.52 04/28/2016   TSH 2.02 01/16/2016   TSH 1.97 12/05/2015   TSH 1.27 11/19/2015   TSH 0.43 01/10/2015   TSH 1.12 11/26/2014   TSH 0.79 07/23/2014   TSH 1.04 05/08/2014   FREET4 1.02 07/21/2018   FREET4 0.99 08/19/2017   FREET4 0.88 04/28/2016   FREET4 0.86 01/16/2016   FREET4 1.02 11/19/2015   FREET4 1.33 01/10/2015   FREET4 1.23 07/23/2014   FREET4 1.02 05/08/2014   FREET4 1.4 10/08/2008  05/05/2017: TSH 1.233 total T4 9.4 11/02/2016: TSH 0.435, total T4 11.6  03/14/2014: ATA 313 (<2) TPO Abs 39 (<9) TSH 3.552 (0.35-4.5), free T4 0.82 (0.8-1.8), Free T3 2.6 (2.3-4.2), reverse T3 12 (8-25) At the same time, she hadn't hemoglobin A1c 6.3, fasting insulin 16.6, vitamin D 66  Pt denies: - feeling nodules in neck - hoarseness - dysphagia - choking - SOB with lying down  No FH of thyroid ds. No FH of thyroid cancer. No h/o radiation tx to head or neck.  No herbal supplements. No Biotin use. No recent steroids use.   Was exercising at [employer] -not during the  coronavirus pandemic.  Husband has prostate cancer.  DM2: Diagnosed in 2016  Reviewed her HbA1c levels: 11/15/2018: HbA1c 6.8% 05/17/2018: HbA1c 6.9% Lab Results  Component Value Date   HGBA1C 6.5 08/28/2016   HGBA1C 6.4 12/05/2015   HGBA1C 6.6 (H) 08/27/2015   HGBA1C 6.4 06/04/2015   HGBA1C 6.8 (H) 02/20/2015   She is on: - Metformin ER 1000 mg with dinner  She checks sugars 1-2 a day: - am: 130-152, 167 >> 122-162 - 2h after b'fast: n/c - lunch: 79 >> 103, 110 - 2h after lunch: n/c - dinner: 106-132, 166 >> 95-119 - 2h after dinner: 103-169, 175, 194 >> 163, 166, 247 - bedtime: 104 Lowest: 79 >> 95 Highest: 194 >> 247  No CKD: 11/15/2018: Glucose 122, BUN/Cr 18/0.8 Lab Results  Component Value Date   BUN 20 08/28/2016   BUN 16 12/05/2015   Lab Results  Component Value Date   CREATININE 0.83 08/28/2016   CREATININE 0.82 12/05/2015  On losartan.  + HL: 11/15/2018: 185/144/46.5/106 Lab Results  Component Value Date   CHOL 195 08/28/2016   HDL 51.30 08/28/2016   LDLCALC 118 (H) 08/28/2016   LDLDIRECT 129.6 07/17/2011   TRIG 130.0 08/28/2016   CHOLHDL 4 08/28/2016  She is not on a statin.  Last eye exam: 03/2019: No DR. + cataracts-worsening, but not at the point of surgery at.  Flu shot: 02/2019  ROS: Constitutional: no  weight gain/no weight loss, no fatigue, no subjective hyperthermia, no subjective hypothermia Eyes: no blurry vision, no xerophthalmia ENT: no sore throat, + see HPI Cardiovascular: no CP/no SOB/no palpitations/no leg swelling Respiratory: no cough/no SOB/no wheezing Gastrointestinal: no N/no V/no D/no C/no acid reflux Musculoskeletal: no muscle aches/no joint aches Skin: no rashes, + hair loss, + brittle nails Neurological: no tremors/no numbness/no tingling/no dizziness  I reviewed pt's medications, allergies, PMH, social hx, family hx, and changes were documented in the history of present illness. Otherwise, unchanged from my  initial visit note.  Past Medical History:  Diagnosis Date  . Arthritis   . Barrett's esophagus   . Bone spur    Left foot / posterior  . Diabetes mellitus without complication ([place])   . Diverticulosis   . GERD (gastroesophageal reflux disease)    esophagitis with ulceration  . Hypertension   . Hypothyroidism   . Menopausal symptoms    hot flashes  . Osteopenia   . Sleep apnea 2007   AHI 45/hr in 2007   Past Surgical History:  Procedure Laterality Date  . ABDOMINAL HYSTERECTOMY  1983   partial/endometriosis  . BREAST EXCISIONAL BIOPSY Right   . BREAST SURGERY  1970's   breast lumpectomy/benign  . BREAST SURGERY  1994   breast implants/saline  . COLONOSCOPY WITH PROPOFOL N/A 04/20/2017   Procedure: COLONOSCOPY WITH PROPOFOL;  Surgeon: Lollie Sails, MD;  Location: [facility] ENDOSCOPY;  Service: Endoscopy;  Laterality: N/A;  . COLONOSCOPY WITH PROPOFOL N/A 06/17/2017   Procedure: COLONOSCOPY WITH PROPOFOL;  Surgeon: Lollie Sails, MD;  Location: [facility] ENDOSCOPY;  Service: Endoscopy;  Laterality: N/A;  . DORSAL COMPARTMENT RELEASE  05/14/2011   Procedure: RELEASE DORSAL COMPARTMENT (DEQUERVAIN);  Surgeon: Cammie Sickle., MD;  Location: [facility];  Service: Orthopedics;  Laterality: Right;  right release dorsal compartment/ de quervain release with debridement of mucoid cyst  . ESOPHAGOGASTRODUODENOSCOPY  03/2008   esophagitis with ulcerations/?barretts  . ESOPHAGOGASTRODUODENOSCOPY (EGD) WITH PROPOFOL N/A 04/20/2017   Procedure: ESOPHAGOGASTRODUODENOSCOPY (EGD) WITH PROPOFOL;  Surgeon: Lollie Sails, MD;  Location: [facility] ENDOSCOPY;  Service: Endoscopy;  Laterality: N/A;  . ESOPHAGOGASTRODUODENOSCOPY (EGD) WITH PROPOFOL  06/17/2017   Procedure: ESOPHAGOGASTRODUODENOSCOPY (EGD) WITH PROPOFOL;  Surgeon: Lollie Sails, MD;  Location: [facility] ENDOSCOPY;  Service: Endoscopy;;  . thumb nodule     removed  . TONSILLECTOMY     History   Social History   . Marital Status: Married    Spouse Name: N/A    Number of Children: 2   Occupational History  . retired   Social History Main Topics  . Smoking status: Never Smoker   . Smokeless tobacco: Never Used  . Alcohol Use: No  . Drug Use: No   Current Outpatient Medications on File Prior to Visit  Medication Sig Dispense Refill  . amLODipine (NORVASC) 5 MG tablet     . calcium-vitamin D (OSCAL WITH D) 500-200 MG-UNIT tablet Take 1 tablet by mouth.    . levothyroxine (SYNTHROID, LEVOTHROID) 100 MCG tablet Take 1 tablet (100 mcg total) by mouth daily before breakfast. 90 tablet 0  . losartan (COZAAR) 50 MG tablet Take 1 tablet (50 mg total) by mouth daily. 90 tablet 1  . metFORMIN (GLUCOPHAGE-XR) 500 MG 24 hr tablet Take 2 tablets (1,000 mg total) by mouth daily with breakfast. 180 tablet 1  . Multiple Vitamin (MULTIVITAMIN) tablet Take 1 tablet by mouth daily.    Marland Kitchen neomycin-polymyxin-hydrocortisone (CORTISPORIN) OTIC solution 3 drops  4 (four) times daily.    . pantoprazole (PROTONIX) 40 MG tablet TAKE 1 TABLET ONCE DAILY.  TAKE 30 TO 45 MINUTES BEFORE BREAKFAST     No current facility-administered medications on file prior to visit.    Allergies  Allergen Reactions  . Benzonatate     Hot flash  . Dilaudid [Hydromorphone Hcl] Other (See Comments)    Face turned red; bp   Family History  Problem Relation Age of Onset  . Hypertension Mother   . Hyperlipidemia Mother   . Colon polyps Mother   . Heart disease Mother   . Migraines Mother   . Cancer Father        lung CA  . Migraines Brother   . Cancer Maternal Aunt        breast CA  . Breast cancer Maternal Aunt    PE: BP 140/82   Pulse 90   Ht 5\' 5"  (1.651 m) Comment: measured  Wt 194 lb (88 kg)   SpO2 90%   BMI 32.28 kg/m  Wt Readings from Last 3 Encounters:  03/28/19 194 lb (88 kg)  07/21/18 197 lb (89.4 kg)  07/20/17 190 lb (86.2 kg)   Constitutional: overweight, in NAD Eyes: PERRLA, EOMI, no exophthalmos ENT:  moist mucous membranes, no thyromegaly, no cervical lymphadenopathy Cardiovascular: RRR, No MRG Respiratory: CTA B Gastrointestinal: abdomen soft, NT, ND, BS+ Musculoskeletal: no deformities, strength intact in all 4 Skin: moist, warm, no rashes Neurological: no tremor with outstretched hands, DTR normal in all 4  ASSESSMENT: 1. Hypothyroidism -Due to Hashimoto's thyroiditis  2. DM2, non-insulin-dependent  3. HL  PLAN:  1. Patient with longstanding Hashimoto's hypothyroidism, on levothyroxine therapy - latest thyroid labs reviewed with pt >> normal 11/2018 - she continues on LT4 100 mcg daily - pt feels good on this dose. - we discussed about taking the thyroid hormone every day, with water, >30 minutes before breakfast, separated by >4 hours from acid reflux medications, calcium, iron, multivitamins. Pt. is taking it correctly.  2.  DM2, non-insulin-dependent -Reviewed most recent HbA1c from 11/2018, which was 6.8%, at goal -At last visit, sugars were mostly at goal but slightly higher in the morning and with occasional hyperglycemic spikes later in the day. -We continue metformin ER but moved the entire dose with dinner.  We discussed at that time that we may need to increase the dose if sugars do not improve after being the dose. -At this visit, sugars are still higher than target in the morning and they are at goal later in the day with the exception of one high blood sugar at 247, which may have been a meter error -We checked her HbA1c today: 6.4%, improved -No changes are needed in her regimen for now, but we did discuss about ways to improve her sugars in the morning to include changing dinner or adding exercise in the second half of the day.  She would like to start exercise.  She is currently only walking as she could not go to the gym due to the coronavirus pandemic. -At next visit, we may need to increase her metformin sugars do not improve -Up-to-date with flu shot - I  will see her back in 4 months  3. HL - Reviewed latest lipid panel from 11/2018: LDL improved -She continues off statins  Philemon Kingdom, MD PhD [facility] Endocrinology

## 2019-03-28 NOTE — Patient Instructions (Addendum)
Please continue Levothyroxine 100 mcg daily.  Take the thyroid hormone every day, with water, at least 30 minutes before breakfast, separated by at least 4 hours from: - acid reflux medications - calcium - iron - multivitamins  Please continue metformin 1000 mg with dinner.  Please return in 4 months.

## 2019-03-28 NOTE — Addendum Note (Signed)
Addended by: Cardell Peach I on: 03/28/2019 11:11 AM   Modules accepted: Orders

## 2019-04-03 NOTE — Therapy (Signed)
[place] MAIN [facility] SERVICES [address] [place], [place], [address] Phone: [phone]   Fax:  [phone]  April 03, 2019   HNOID  Physical Therapy Discharge Summary  Patient: Rebecca Kramer  MRN: [id number]  Date of Birth: 1945-12-20   Diagnosis: No diagnosis found. No data recorded  The above patient had been seen in Physical Therapy 1 times of 10 treatments scheduled with 0 no shows and 0 cancellations.  The treatment consisted of education and therapeutic exercise The patient is: Unchanged  Subjective: Pt. Decided that she needed to prioritize other health issues first and cancelled her remaining appointments before first treatment session.  Discharge Findings: no change  Functional Status at Discharge: Independent  No Goals Met    Sincerely,  Willa Rough DPT, ATC Willa Rough, PT   CC No Recipients  [place] MAIN [facility] SERVICES [address] [place], [place], [address] Phone: [phone]   Fax:  [phone]  Patient: Rebecca Kramer  MRN: [id number]  Date of Birth: [birth year]

## 2019-04-04 ENCOUNTER — Ambulatory Visit: Payer: BC Managed Care – PPO

## 2019-04-11 ENCOUNTER — Ambulatory Visit: Payer: BC Managed Care – PPO

## 2019-04-18 ENCOUNTER — Ambulatory Visit: Payer: BC Managed Care – PPO

## 2019-04-25 ENCOUNTER — Ambulatory Visit: Payer: BC Managed Care – PPO

## 2019-05-02 ENCOUNTER — Ambulatory Visit: Payer: BC Managed Care – PPO

## 2019-05-09 ENCOUNTER — Ambulatory Visit: Payer: BC Managed Care – PPO

## 2019-05-10 DIAGNOSIS — D225 Melanocytic nevi of trunk: Secondary | ICD-10-CM | POA: Diagnosis not present

## 2019-05-10 DIAGNOSIS — L720 Epidermal cyst: Secondary | ICD-10-CM | POA: Diagnosis not present

## 2019-05-10 DIAGNOSIS — D2262 Melanocytic nevi of left upper limb, including shoulder: Secondary | ICD-10-CM | POA: Diagnosis not present

## 2019-05-10 DIAGNOSIS — Z85828 Personal history of other malignant neoplasm of skin: Secondary | ICD-10-CM | POA: Diagnosis not present

## 2019-05-11 DIAGNOSIS — Z79899 Other long term (current) drug therapy: Secondary | ICD-10-CM | POA: Diagnosis not present

## 2019-05-11 DIAGNOSIS — E119 Type 2 diabetes mellitus without complications: Secondary | ICD-10-CM | POA: Diagnosis not present

## 2019-05-11 DIAGNOSIS — E7800 Pure hypercholesterolemia, unspecified: Secondary | ICD-10-CM | POA: Diagnosis not present

## 2019-05-11 DIAGNOSIS — I10 Essential (primary) hypertension: Secondary | ICD-10-CM | POA: Diagnosis not present

## 2019-05-11 DIAGNOSIS — L659 Nonscarring hair loss, unspecified: Secondary | ICD-10-CM | POA: Diagnosis not present

## 2019-05-16 ENCOUNTER — Ambulatory Visit: Payer: BC Managed Care – PPO

## 2019-05-18 DIAGNOSIS — E113299 Type 2 diabetes mellitus with mild nonproliferative diabetic retinopathy without macular edema, unspecified eye: Secondary | ICD-10-CM | POA: Diagnosis not present

## 2019-05-18 DIAGNOSIS — E038 Other specified hypothyroidism: Secondary | ICD-10-CM | POA: Diagnosis not present

## 2019-05-18 DIAGNOSIS — I10 Essential (primary) hypertension: Secondary | ICD-10-CM | POA: Diagnosis not present

## 2019-05-18 DIAGNOSIS — E7800 Pure hypercholesterolemia, unspecified: Secondary | ICD-10-CM | POA: Diagnosis not present

## 2019-07-01 DIAGNOSIS — G4733 Obstructive sleep apnea (adult) (pediatric): Secondary | ICD-10-CM | POA: Diagnosis not present

## 2019-07-21 ENCOUNTER — Other Ambulatory Visit: Payer: Self-pay

## 2019-07-25 ENCOUNTER — Encounter: Payer: Self-pay | Admitting: Internal Medicine

## 2019-07-25 ENCOUNTER — Ambulatory Visit: Payer: BC Managed Care – PPO | Admitting: Internal Medicine

## 2019-07-25 VITALS — BP 126/80 | HR 90 | Ht 65.0 in | Wt 193.0 lb

## 2019-07-25 DIAGNOSIS — E119 Type 2 diabetes mellitus without complications: Secondary | ICD-10-CM | POA: Diagnosis not present

## 2019-07-25 DIAGNOSIS — E038 Other specified hypothyroidism: Secondary | ICD-10-CM | POA: Diagnosis not present

## 2019-07-25 DIAGNOSIS — E7800 Pure hypercholesterolemia, unspecified: Secondary | ICD-10-CM | POA: Diagnosis not present

## 2019-07-25 DIAGNOSIS — E063 Autoimmune thyroiditis: Secondary | ICD-10-CM

## 2019-07-25 LAB — POCT GLYCOSYLATED HEMOGLOBIN (HGB A1C): Hemoglobin A1C: 6.7 % — AB (ref 4.0–5.6)

## 2019-07-25 NOTE — Patient Instructions (Signed)
Please continue Levothyroxine 100 mcg daily.  Take the thyroid hormone every day, with water, at least 30 minutes before breakfast, separated by at least 4 hours from: - acid reflux medications - calcium - iron - multivitamins  Please continue metformin 1000 mg but move it with dinner.  Please return in 4 months.

## 2019-07-25 NOTE — Addendum Note (Signed)
Addended by: Cardell Peach I on: 07/25/2019 11:11 AM   Modules accepted: Orders

## 2019-07-25 NOTE — Progress Notes (Signed)
Patient ID: FLORABEL FRYE, female   DOB: February 26, 1946, 74 y.o.   MRN: [id number]   This visit occurred during the SARS-CoV-2 public health emergency.  Safety protocols were in place, including screening questions prior to the visit, additional usage of staff PPE, and extensive cleaning of exam room while observing appropriate contact time as indicated for disinfecting solutions.   HPI  LIANN RACCA is a 74 y.o.-year-old female, initially referred by her PCP, Dr.Tower, presenting for f/u for Hashimoto hypothyroidism and DM2, non-insulin-dependent, without long-term complications. She was seeing Dr Sharol Roussel [facility]) >> stopped seeing her 2/2 cost.  4 months ago.  Hypothyroidism: - dx'ed "many years ago"  She was previously on: -Generic levothyroxine -Brand names -Then, Naturethroid 81.5 mg + Cytomel 5 mg bid (equivalent to ~180 mcg LT4)  -Currently back on generic levothyroxine  She is on levothyroxine 100 mcg daily, taken: - in am - fasting - at least 30 min from b'fast - + Ca, MVI, PPIs with lunch - not on Biotin  Reviewed her TFTs: 11/15/2018: 1.086 Lab Results  Component Value Date   TSH 1.68 07/21/2018   TSH 1.24 08/19/2017   TSH 1.52 04/28/2016   TSH 2.02 01/16/2016   TSH 1.97 12/05/2015   TSH 1.27 11/19/2015   TSH 0.43 01/10/2015   TSH 1.12 11/26/2014   TSH 0.79 07/23/2014   TSH 1.04 05/08/2014   FREET4 1.02 07/21/2018   FREET4 0.99 08/19/2017   FREET4 0.88 04/28/2016   FREET4 0.86 01/16/2016   FREET4 1.02 11/19/2015   FREET4 1.33 01/10/2015   FREET4 1.23 07/23/2014   FREET4 1.02 05/08/2014   FREET4 1.4 10/08/2008  05/05/2017: TSH 1.233 total T4 9.4 11/02/2016: TSH 0.435, total T4 11.6  03/14/2014: ATA 313 (<2) TPO Abs 39 (<9) TSH 3.552 (0.35-4.5), free T4 0.82 (0.8-1.8), Free T3 2.6 (2.3-4.2), reverse T3 12 (8-25) At the same time, she hadn't hemoglobin A1c 6.3, fasting insulin 16.6, vitamin D 66  Pt denies: - feeling nodules in neck -  hoarseness - dysphagia - choking - SOB with lying down  No FH of thyroid ds. No FH of thyroid cancer. No h/o radiation tx to head or neck.  No herbal supplements. No Biotin use. No recent steroids use.   She was exercising at [employer], but not during the coronavirus pandemic.  Husband has prostate cancer.  DM2: Diagnosed in 2016  Reviewed HbA1c levels: Lab Results  Component Value Date   HGBA1C 6.4 (A) 03/28/2019   HGBA1C 6.5 08/28/2016   HGBA1C 6.4 12/05/2015   HGBA1C 6.6 (H) 08/27/2015   HGBA1C 6.4 06/04/2015   HGBA1C 6.8 (H) 02/20/2015  11/15/2018: HbA1c 6.8% 05/17/2018: HbA1c 6.9%  She is on: - Metformin ER 1000 mg with lunch or dinner  She checks sugars 1-2 times a day: - am: 130-152, 167 >> 122-162 >> 135-185, 257 - 2h after b'fast: n/c >> 131 - lunch: 79 >> 103, 110 >> 86-159 - 2h after lunch: n/c >> 113 - dinner: 106-132, 166 >> 95-119 >> n/c - 2h after dinner: 103-169, 175, 194 >> 163, 166, 247 >> n/c - bedtime: 104 Lowest: 79 >> 95 Highest: 194 >> 247  She and her husband started walking after the holidays-walking 30 to 40 minutes a day.  No CKD: 11/15/2018: Glucose 122, BUN/Cr 18/0.8 Lab Results  Component Value Date   BUN 20 08/28/2016   BUN 16 12/05/2015   Lab Results  Component Value Date   CREATININE 0.83 08/28/2016   CREATININE 0.82  12/05/2015  On losartan.  + HL: 11/15/2018: 185/144/46.5/106 Lab Results  Component Value Date   CHOL 195 08/28/2016   HDL 51.30 08/28/2016   LDLCALC 118 (H) 08/28/2016   LDLDIRECT 129.6 07/17/2011   TRIG 130.0 08/28/2016   CHOLHDL 4 08/28/2016  She is not on a statin.  Last eye exam: 03/2019: No DR.  She had cataracts which were worsening, but not at the point of surgery.  Flu shot: 02/2019  ROS: Constitutional: no weight gain/no weight loss, no fatigue, no subjective hyperthermia, no subjective hypothermia Eyes: no blurry vision, no xerophthalmia ENT: no sore throat, + see  HPI Cardiovascular: no CP/no SOB/no palpitations/no leg swelling Respiratory: no cough/no SOB/no wheezing Gastrointestinal: no N/no V/no D/no C/no acid reflux Musculoskeletal: no muscle aches/no joint aches Skin: no rashes, no hair loss Neurological: no tremors/no numbness/no tingling/no dizziness  I reviewed pt's medications, allergies, PMH, social hx, family hx, and changes were documented in the history of present illness. Otherwise, unchanged from my initial visit note.  Past Medical History:  Diagnosis Date  . Arthritis   . Barrett's esophagus   . Bone spur    Left foot / posterior  . Diabetes mellitus without complication ([place])   . Diverticulosis   . GERD (gastroesophageal reflux disease)    esophagitis with ulceration  . Hypertension   . Hypothyroidism   . Menopausal symptoms    hot flashes  . Osteopenia   . Sleep apnea 2007   AHI 45/hr in 2007   Past Surgical History:  Procedure Laterality Date  . ABDOMINAL HYSTERECTOMY  1983   partial/endometriosis  . BREAST EXCISIONAL BIOPSY Right   . BREAST SURGERY  1970's   breast lumpectomy/benign  . BREAST SURGERY  1994   breast implants/saline  . COLONOSCOPY WITH PROPOFOL N/A 04/20/2017   Procedure: COLONOSCOPY WITH PROPOFOL;  Surgeon: Lollie Sails, MD;  Location[facility] ENDOSCOPY;  Service: Endoscopy;  Laterality: N/A;  . COLONOSCOPY WITH PROPOFOL N/A 06/17/2017   Procedure: COLONOSCOPY WITH PROPOFOL;  Surgeon: Lollie Sails, MD;  Location: [facility] ENDOSCOPY;  Service: Endoscopy;  Laterality: N/A;  . DORSAL COMPARTMENT RELEASE  05/14/2011   Procedure: RELEASE DORSAL COMPARTMENT (DEQUERVAIN);  Surgeon: Cammie Sickle., MD;  Location: [facility];  Service: Orthopedics;  Laterality: Right;  right release dorsal compartment/ de quervain release with debridement of mucoid cyst  . ESOPHAGOGASTRODUODENOSCOPY  03/2008   esophagitis with ulcerations/?barretts  . ESOPHAGOGASTRODUODENOSCOPY (EGD) WITH PROPOFOL  N/A 04/20/2017   Procedure: ESOPHAGOGASTRODUODENOSCOPY (EGD) WITH PROPOFOL;  Surgeon: Lollie Sails, MD;  Location: [facility] ENDOSCOPY;  Service: Endoscopy;  Laterality: N/A;  . ESOPHAGOGASTRODUODENOSCOPY (EGD) WITH PROPOFOL  06/17/2017   Procedure: ESOPHAGOGASTRODUODENOSCOPY (EGD) WITH PROPOFOL;  Surgeon: Lollie Sails, MD;  Location: [facility] ENDOSCOPY;  Service: Endoscopy;;  . thumb nodule     removed  . TONSILLECTOMY     History   Social History  . Marital Status: Married    Spouse Name: N/A    Number of Children: 2   Occupational History  . retired   Social History Main Topics  . Smoking status: Never Smoker   . Smokeless tobacco: Never Used  . Alcohol Use: No  . Drug Use: No   Current Outpatient Medications on File Prior to Visit  Medication Sig Dispense Refill  . amLODipine (NORVASC) 5 MG tablet     . calcium-vitamin D (OSCAL WITH D) 500-200 MG-UNIT tablet Take 1 tablet by mouth.    . levothyroxine (SYNTHROID, LEVOTHROID) 100 MCG  tablet Take 1 tablet (100 mcg total) by mouth daily before breakfast. 90 tablet 0  . losartan (COZAAR) 50 MG tablet Take 1 tablet (50 mg total) by mouth daily. 90 tablet 1  . metFORMIN (GLUCOPHAGE-XR) 500 MG 24 hr tablet Take 2 tablets (1,000 mg total) by mouth daily with breakfast. 180 tablet 1  . Multiple Vitamin (MULTIVITAMIN) tablet Take 1 tablet by mouth daily.    Marland Kitchen neomycin-polymyxin-hydrocortisone (CORTISPORIN) OTIC solution 3 drops 4 (four) times daily.    . pantoprazole (PROTONIX) 40 MG tablet TAKE 1 TABLET ONCE DAILY.  TAKE 30 TO 45 MINUTES BEFORE BREAKFAST     No current facility-administered medications on file prior to visit.   Allergies  Allergen Reactions  . Benzonatate     Hot flash  . Dilaudid [Hydromorphone Hcl] Other (See Comments)    Face turned red; bp   Family History  Problem Relation Age of Onset  . Hypertension Mother   . Hyperlipidemia Mother   . Colon polyps Mother   . Heart disease Mother   . Migraines  Mother   . Cancer Father        lung CA  . Migraines Brother   . Cancer Maternal Aunt        breast CA  . Breast cancer Maternal Aunt    PE: BP 126/80   Pulse 90   Ht 5\' 5"  (1.651 m)   Wt 193 lb (87.5 kg)   SpO2 95%   BMI 32.12 kg/m  Wt Readings from Last 3 Encounters:  07/25/19 193 lb (87.5 kg)  03/28/19 194 lb (88 kg)  07/21/18 197 lb (89.4 kg)   Constitutional: overweight, in NAD Eyes: PERRLA, EOMI, no exophthalmos ENT: moist mucous membranes, no thyromegaly, no cervical lymphadenopathy Cardiovascular: RRR, No MRG Respiratory: CTA B Gastrointestinal: abdomen soft, NT, ND, BS+ Musculoskeletal: no deformities, strength intact in all 4 Skin: moist, warm, no rashes Neurological: no tremor with outstretched hands, DTR normal in all 4  ASSESSMENT: 1. Hypothyroidism - Due to Hashimoto's thyroiditis  2. DM2, non-insulin-dependent  3. HL  PLAN:  1. Patient with longstanding Hashimoto's hypothyroidism, on levothyroxine therapy. - latest thyroid labs reviewed with pt >> normal in 11/2018  - she continues on LT4 100 mcg daily - pt feels good on this dose. - we discussed about taking the thyroid hormone every day, with water, >30 minutes before breakfast, separated by >4 hours from acid reflux medications, calcium, iron, multivitamins. Pt. is taking it correctly. - will check thyroid tests at next visit  2.  DM2, non-insulin-dependent -At last visit, sugars are mostly at goal but higher than target in the morning.  At that time, HbA1c was improved, at 6.4%.  She occasionally had hyperglycemic spikes later in the day. -At last visit, we discussed about ways to decrease her hepatic insulin resistance: Starting exercise, improving diet.  We again discussed at this visit about improving her diet and I gave her specific examples about healthy changes. - at today's visit: HbA1c is 6.7%, slightly higher -We will not change her regimen at this visit but I did advise her to move the  entire metformin dose with dinner to improve her morning sugars.  As she is still taking it with either lunch or dinner now. -Continue checking sugars once a day -She is up-to-date with eye exams  3. HL - Reviewed latest lipid panel from 11/2018: LDL slightly above goal - She is not on a statin   Philemon Kingdom, MD PhD [facility]  Endocrinology

## 2019-07-27 ENCOUNTER — Ambulatory Visit: Payer: BC Managed Care – PPO | Attending: Internal Medicine

## 2019-07-27 DIAGNOSIS — Z23 Encounter for immunization: Secondary | ICD-10-CM | POA: Diagnosis not present

## 2019-07-27 NOTE — Progress Notes (Signed)
   Covid-19 Vaccination Clinic  Name:  Rebecca Kramer    MRN: [id number] DOB: Aug 27, 1945  07/27/2019  Rebecca Kramer was observed post Covid-19 immunization for 15 minutes without incidence. She was provided with Vaccine Information Sheet and instruction to access the V-Safe system.   Rebecca Kramer was instructed to call 911 with any severe reactions post vaccine: Marland Kitchen Difficulty breathing  . Swelling of your face and throat  . A fast heartbeat  . A bad rash all over your body  . Dizziness and weakness    Immunizations Administered    Name Date Dose VIS Date Route   Pfizer COVID-19 Vaccine 07/27/2019  1:21 PM 0.3 mL 06/16/2019 Intramuscular   Manufacturer: [place]   Lot: [id number]   [place]: [id number]

## 2019-07-31 ENCOUNTER — Other Ambulatory Visit: Payer: Self-pay | Admitting: Internal Medicine

## 2019-07-31 DIAGNOSIS — Z1231 Encounter for screening mammogram for malignant neoplasm of breast: Secondary | ICD-10-CM

## 2019-08-17 ENCOUNTER — Ambulatory Visit: Payer: BC Managed Care – PPO | Attending: Internal Medicine

## 2019-08-17 DIAGNOSIS — Z23 Encounter for immunization: Secondary | ICD-10-CM

## 2019-08-17 NOTE — Progress Notes (Signed)
   Covid-19 Vaccination Clinic  Name:  Rebecca Kramer    MRN: [id number] DOB: 03/23/1946  08/17/2019  [name] was observed post Covid-19 immunization for 15 minutes without incidence. She was provided with Vaccine Information Sheet and instruction to access the V-Safe system.   [name] was instructed to call 911 with any severe reactions post vaccine: Marland Kitchen Difficulty breathing  . Swelling of your face and throat  . A fast heartbeat  . A bad rash all over your body  . Dizziness and weakness    Immunizations Administered    Name Date Dose VIS Date Route   Pfizer COVID-19 Vaccine 08/17/2019  2:05 PM 0.3 mL 06/16/2019 Intramuscular   Manufacturer: [place]   Lot: [id number]   [place]: [id number]

## 2019-08-18 DIAGNOSIS — I10 Essential (primary) hypertension: Secondary | ICD-10-CM | POA: Diagnosis not present

## 2019-08-18 DIAGNOSIS — Z79899 Other long term (current) drug therapy: Secondary | ICD-10-CM | POA: Diagnosis not present

## 2019-08-18 DIAGNOSIS — E119 Type 2 diabetes mellitus without complications: Secondary | ICD-10-CM | POA: Diagnosis not present

## 2019-08-18 DIAGNOSIS — E038 Other specified hypothyroidism: Secondary | ICD-10-CM | POA: Diagnosis not present

## 2019-08-18 DIAGNOSIS — E7800 Pure hypercholesterolemia, unspecified: Secondary | ICD-10-CM | POA: Diagnosis not present

## 2019-08-28 ENCOUNTER — Encounter: Payer: Self-pay | Admitting: Internal Medicine

## 2019-09-07 ENCOUNTER — Ambulatory Visit
Admission: RE | Admit: 2019-09-07 | Discharge: 2019-09-07 | Disposition: A | Discharge status: Home / Self Care | Payer: BC Managed Care – PPO | Source: Ambulatory Visit | Attending: Internal Medicine | Admitting: Internal Medicine

## 2019-09-07 ENCOUNTER — Other Ambulatory Visit: Payer: Self-pay

## 2019-09-07 DIAGNOSIS — Z1231 Encounter for screening mammogram for malignant neoplasm of breast: Secondary | ICD-10-CM | POA: Diagnosis not present

## 2019-09-11 ENCOUNTER — Other Ambulatory Visit: Payer: Self-pay | Admitting: Internal Medicine

## 2019-09-11 DIAGNOSIS — R928 Other abnormal and inconclusive findings on diagnostic imaging of breast: Secondary | ICD-10-CM

## 2019-09-22 ENCOUNTER — Other Ambulatory Visit: Payer: Self-pay | Admitting: Internal Medicine

## 2019-09-22 ENCOUNTER — Other Ambulatory Visit: Payer: Self-pay

## 2019-09-22 ENCOUNTER — Ambulatory Visit
Admission: RE | Admit: 2019-09-22 | Discharge: 2019-09-22 | Disposition: A | Discharge status: Home / Self Care | Payer: BC Managed Care – PPO | Source: Ambulatory Visit | Attending: Internal Medicine | Admitting: Internal Medicine

## 2019-09-22 DIAGNOSIS — N6011 Diffuse cystic mastopathy of right breast: Secondary | ICD-10-CM | POA: Diagnosis not present

## 2019-09-22 DIAGNOSIS — R928 Other abnormal and inconclusive findings on diagnostic imaging of breast: Secondary | ICD-10-CM | POA: Diagnosis not present

## 2019-09-26 DIAGNOSIS — H35371 Puckering of macula, right eye: Secondary | ICD-10-CM | POA: Diagnosis not present

## 2019-09-26 DIAGNOSIS — H25813 Combined forms of age-related cataract, bilateral: Secondary | ICD-10-CM | POA: Diagnosis not present

## 2019-11-16 DIAGNOSIS — R5383 Other fatigue: Secondary | ICD-10-CM | POA: Diagnosis not present

## 2019-11-16 DIAGNOSIS — R5381 Other malaise: Secondary | ICD-10-CM | POA: Diagnosis not present

## 2019-11-22 ENCOUNTER — Other Ambulatory Visit: Payer: Self-pay

## 2019-11-22 ENCOUNTER — Ambulatory Visit (INDEPENDENT_AMBULATORY_CARE_PROVIDER_SITE_OTHER): Payer: BC Managed Care – PPO | Admitting: Internal Medicine

## 2019-11-22 DIAGNOSIS — E063 Autoimmune thyroiditis: Secondary | ICD-10-CM

## 2019-11-22 DIAGNOSIS — E119 Type 2 diabetes mellitus without complications: Secondary | ICD-10-CM

## 2019-11-22 DIAGNOSIS — E038 Other specified hypothyroidism: Secondary | ICD-10-CM

## 2019-11-22 DIAGNOSIS — E7800 Pure hypercholesterolemia, unspecified: Secondary | ICD-10-CM

## 2019-11-22 NOTE — Progress Notes (Signed)
Patient ID: Rebecca Kramer, female   DOB: 1946-01-05, 74 y.o.   MRN: [id number]   Pt was seen 11/23/2019. Precharted 11/22/2019, but No Show then.  This visit occurred during the SARS-CoV-2 public health emergency.  Safety protocols were in place, including screening questions prior to the visit, additional usage of staff PPE, and extensive cleaning of exam room while observing appropriate contact time as indicated for disinfecting solutions.   HPI  Rebecca Kramer is a 74 y.o.-year-old female, initially referred by her PCP, Dr.Tower, presenting for f/u for Hashimoto hypothyroidism and DM2, non-insulin-dependent, without long-term complications. She was seeing Dr Sharol Roussel [facility]) >> stopped seeing her 2/2 cost.  Last visit 4 months  Hypothyroidism: - dx'ed "many years ago"  She was previously on: -Generic levothyroxine -Brand names -Then, Naturethroid 81.5 mg + Cytomel 5 mg bid (equivalent to ~180 mcg LT4)  -Currently back on generic levothyroxine  Pt is on levothyroxine 100 mcg daily, taken: - in am - fasting - at least 30 min from b'fast - + Ca, MVI, PPIs with lunch - not on Biotin  Reviewed her TFTs: 08/18/2019: TSH 0.951 11/15/2018: 1.086 Lab Results  Component Value Date   TSH 1.68 07/21/2018   TSH 1.24 08/19/2017   TSH 1.52 04/28/2016   TSH 2.02 01/16/2016   TSH 1.97 12/05/2015   TSH 1.27 11/19/2015   TSH 0.43 01/10/2015   TSH 1.12 11/26/2014   TSH 0.79 07/23/2014   TSH 1.04 05/08/2014   FREET4 1.02 07/21/2018   FREET4 0.99 08/19/2017   FREET4 0.88 04/28/2016   FREET4 0.86 01/16/2016   FREET4 1.02 11/19/2015   FREET4 1.33 01/10/2015   FREET4 1.23 07/23/2014   FREET4 1.02 05/08/2014   FREET4 1.4 10/08/2008  05/05/2017: TSH 1.233 total T4 9.4 11/02/2016: TSH 0.435, total T4 11.6  03/14/2014: ATA 313 (<2) TPO Abs 39 (<9) TSH 3.552 (0.35-4.5), free T4 0.82 (0.8-1.8), Free T3 2.6 (2.3-4.2), reverse T3 12 (8-25) At the same time, she hadn't  hemoglobin A1c 6.3, fasting insulin 16.6, vitamin D 66  Pt denies: - feeling nodules in neck - hoarseness - dysphagia - choking - SOB with lying down  No FH of thyroid ds. No FH of thyroid cancer. No h/o radiation tx to head or neck.  No herbal supplements. No Biotin use. No recent steroids use.   She was exercising at [employer], but not during the coronavirus pandemic.  Husband has prostate cancer.  DM2: -Diagnosed in 2016  Reviewed HbA1c levels: Lab Results  Component Value Date   HGBA1C 6.7 (A) 07/25/2019   HGBA1C 6.4 (A) 03/28/2019   HGBA1C 6.5 08/28/2016   HGBA1C 6.4 12/05/2015   HGBA1C 6.6 (H) 08/27/2015   HGBA1C 6.4 06/04/2015   HGBA1C 6.8 (H) 02/20/2015  11/15/2018: HbA1c 6.8% 05/17/2018: HbA1c 6.9%  She is on: - Metformin ER 500 mg with lunch or dinner >> 1000 mg with dinner  She checks sugars 1-2 times a day - am: 122-162 >> 135-185, 257 >> 135-175 - 2h after b'fast: n/c >> 131 >>  - lunch: 79 >> 103, 110 >> 86-159 >> 99-110 - 2h after lunch: n/c >> 113 >> n/c >> 175-185 - dinner: 106-132, 166 >> 95-119 >> n/c >> 99-115 - 2h after dinner: 103-169, 175, 194 >> 163, 166, 247 >> n/c - bedtime: 104 Lowest: 79 >> 95 >> 99 Highest: 194 >> 247 >> 185  She and her husband started walking after the holidays-walking 30 to 40 minutes a day.  No  CKD: 08/18/2019: Glucose 173, BUN/Cr 14/0.9 11/15/2018: Glucose 122, BUN/Cr 18/0.8 Lab Results  Component Value Date   BUN 20 08/28/2016   BUN 16 12/05/2015   Lab Results  Component Value Date   CREATININE 0.83 08/28/2016   CREATININE 0.82 12/05/2015  On losartan.  + HL: 05/11/2019: 188/248/42.7/96 11/15/2018: 185/144/46.5/106 Lab Results  Component Value Date   CHOL 195 08/28/2016   HDL 51.30 08/28/2016   LDLCALC 118 (H) 08/28/2016   LDLDIRECT 129.6 07/17/2011   TRIG 130.0 08/28/2016   CHOLHDL 4 08/28/2016  Not on a statin.  Last eye exam: 03/2019: No DR.  She had cataracts which were worsening,  but not at the point of surgery.  Flu shot: 02/2019  ROS: Constitutional: no weight gain/no weight loss, no fatigue, no subjective hyperthermia, no subjective hypothermia Eyes: no blurry vision, no xerophthalmia ENT: no sore throat, + see HPI Cardiovascular: no CP/no SOB/no palpitations/no leg swelling Respiratory: no cough/no SOB/no wheezing Gastrointestinal: no N/no V/no D/no C/no acid reflux Musculoskeletal: no muscle aches/no joint aches Skin: no rashes, + hair loss Neurological: no tremors/no numbness/no tingling/no dizziness  I reviewed pt's medications, allergies, PMH, social hx, family hx, and changes were documented in the history of present illness. Otherwise, unchanged from my initial visit note.  Past Medical History:  Diagnosis Date  . Arthritis   . Barrett's esophagus   . Bone spur    Left foot / posterior  . Diabetes mellitus without complication ([place])   . Diverticulosis   . GERD (gastroesophageal reflux disease)    esophagitis with ulceration  . Hypertension   . Hypothyroidism   . Menopausal symptoms    hot flashes  . Osteopenia   . Sleep apnea 2007   AHI 45/hr in 2007   Past Surgical History:  Procedure Laterality Date  . ABDOMINAL HYSTERECTOMY  1983   partial/endometriosis  . BREAST EXCISIONAL BIOPSY Right   . BREAST SURGERY  1970's   breast lumpectomy/benign  . BREAST SURGERY  1994   breast implants/saline  . COLONOSCOPY WITH PROPOFOL N/A 04/20/2017   Procedure: COLONOSCOPY WITH PROPOFOL;  Surgeon: Lollie Sails, MD;  Location: [facility] ENDOSCOPY;  Service: Endoscopy;  Laterality: N/A;  . COLONOSCOPY WITH PROPOFOL N/A 06/17/2017   Procedure: COLONOSCOPY WITH PROPOFOL;  Surgeon: Lollie Sails, MD;  Location: [facility] ENDOSCOPY;  Service: Endoscopy;  Laterality: N/A;  . DORSAL COMPARTMENT RELEASE  05/14/2011   Procedure: RELEASE DORSAL COMPARTMENT (DEQUERVAIN);  Surgeon: Cammie Sickle., MD;  Location[facility];  Service:  Orthopedics;  Laterality: Right;  right release dorsal compartment/ de quervain release with debridement of mucoid cyst  . ESOPHAGOGASTRODUODENOSCOPY  03/2008   esophagitis with ulcerations/?barretts  . ESOPHAGOGASTRODUODENOSCOPY (EGD) WITH PROPOFOL N/A 04/20/2017   Procedure: ESOPHAGOGASTRODUODENOSCOPY (EGD) WITH PROPOFOL;  Surgeon: Lollie Sails, MD;  Location: [facility] ENDOSCOPY;  Service: Endoscopy;  Laterality: N/A;  . ESOPHAGOGASTRODUODENOSCOPY (EGD) WITH PROPOFOL  06/17/2017   Procedure: ESOPHAGOGASTRODUODENOSCOPY (EGD) WITH PROPOFOL;  Surgeon: Lollie Sails, MD;  Location: [facility] ENDOSCOPY;  Service: Endoscopy;;  . thumb nodule     removed  . TONSILLECTOMY     History   Social History  . Marital Status: Married    Spouse Name: N/A    Number of Children: 2   Occupational History  . retired   Social History Main Topics  . Smoking status: Never Smoker   . Smokeless tobacco: Never Used  . Alcohol Use: No  . Drug Use: No   Current Outpatient Medications  on File Prior to Visit  Medication Sig Dispense Refill  . amLODipine (NORVASC) 5 MG tablet     . calcium-vitamin D (OSCAL WITH D) 500-200 MG-UNIT tablet Take 1 tablet by mouth.    . levothyroxine (SYNTHROID, LEVOTHROID) 100 MCG tablet Take 1 tablet (100 mcg total) by mouth daily before breakfast. 90 tablet 0  . losartan (COZAAR) 50 MG tablet Take 1 tablet (50 mg total) by mouth daily. 90 tablet 1  . metFORMIN (GLUCOPHAGE-XR) 500 MG 24 hr tablet Take 2 tablets (1,000 mg total) by mouth daily with breakfast. 180 tablet 1  . Multiple Vitamin (MULTIVITAMIN) tablet Take 1 tablet by mouth daily.    . pantoprazole (PROTONIX) 40 MG tablet TAKE 1 TABLET ONCE DAILY.  TAKE 30 TO 45 MINUTES BEFORE BREAKFAST     No current facility-administered medications on file prior to visit.   Allergies  Allergen Reactions  . Benzonatate     Hot flash  . Dilaudid [Hydromorphone Hcl] Other (See Comments)    Face turned red; bp   Family  History  Problem Relation Age of Onset  . Hypertension Mother   . Hyperlipidemia Mother   . Colon polyps Mother   . Heart disease Mother   . Migraines Mother   . Cancer Father        lung CA  . Migraines Brother   . Cancer Maternal Aunt        breast CA  . Breast cancer Maternal Aunt    PE:  There were no vitals taken for this visit. Wt Readings from Last 3 Encounters:  11/23/19 191 lb (86.6 kg)  07/25/19 193 lb (87.5 kg)  03/28/19 194 lb (88 kg)   Constitutional: overweight, in NAD Eyes: PERRLA, EOMI, no exophthalmos ENT: moist mucous membranes, no thyromegaly, no cervical lymphadenopathy Cardiovascular: RRR, No MRG Respiratory: CTA B Gastrointestinal: abdomen soft, NT, ND, BS+ Musculoskeletal: no deformities, strength intact in all 4 Skin: moist, warm, no rashes Neurological: no tremor with outstretched hands, DTR normal in all 4  ASSESSMENT: 1. Hypothyroidism - Due to Hashimoto's thyroiditis  2. DM2, non-insulin-dependent  3. HL  PLAN:  1. Patient with longstanding Hashimoto's hypothyroidism, on levothyroxine therapy. - latest thyroid labs reviewed with pt >> normal in 08/2019: TSH 0.951  - she continues on LT4 100 mcg daily - pt feels good on this dose, however, she mentions that she still cannot lose weight and has hair loss.  She is interested in trying brand name Synthroid.  She also has questions about supplementing levothyroxine, switching to Tirosint, or to compounded thyroid hormones.  We discussed about each of these options and ended up deciding to try Synthroid brand name. - we discussed about taking the thyroid hormone every day, with water, >30 minutes before breakfast, separated by >4 hours from acid reflux medications, calcium, iron, multivitamins. Pt. is taking it correctly.  2.  DM2, non-insulin-dependent -In the past, I advised her to move the entire metformin at night to improve her morning sugars.  However, at last visit she was still taking it  with either lunch or dinner.  We discussed in the past about ways to improve her insulin resistance including exercise and a reduced fat diet.  At last visit HbA1c was slightly higher, at 6.7%.  We did not change her regimen at that time. -At this visit, sugars are still high in the morning, only slightly better than before and they are high, after meals.  Discussed about the use of checking sugars  at bedtime but for now, I suggested to start a low-dose [place].  We discussed about benefits and possible side effects.  Advised her to stay well-hydrated while on this medication. - I advised her to: Patient Instructions  Please change to Synthroid 100 mcg daily.  Take the thyroid hormone every day, with water, at least 30 minutes before breakfast, separated by at least 4 hours from: - acid reflux medications - calcium - iron - multivitamins  Please continue: - Metformin 1000 mg with dinner  Try to start: - Farxiga 5 mg before b'fast  Please return in 3-4 months.  - we checked her HbA1c: 7.2% (higher) - advised to check sugars at different times of the day - 1x a day, rotating check times - advised for yearly eye exams >> she is UTD - return to clinic in 4 months  3. HL -Reviewed latest lipid panel from 05/2019: LDL at goal since she does not diabetic complications triglycerides high -She is not on a statin   Philemon Kingdom, MD PhD [facility] Endocrinology

## 2019-11-23 ENCOUNTER — Ambulatory Visit: Payer: Self-pay | Admitting: Internal Medicine

## 2019-11-23 ENCOUNTER — Encounter: Payer: Self-pay | Admitting: Internal Medicine

## 2019-11-23 VITALS — BP 118/80 | HR 77 | Ht 65.0 in | Wt 191.0 lb

## 2019-11-23 DIAGNOSIS — Z5329 Procedure and treatment not carried out because of patient's decision for other reasons: Secondary | ICD-10-CM

## 2019-11-23 DIAGNOSIS — Z91199 Patient's noncompliance with other medical treatment and regimen due to unspecified reason: Secondary | ICD-10-CM

## 2019-11-23 MED ORDER — SYNTHROID 100 MCG PO TABS: 100.0000 ug | ORAL_TABLET | Freq: Every day | ORAL | 3 refills | Status: DC

## 2019-11-23 MED ORDER — FARXIGA 5 MG PO TABS: 5.0000 mg | ORAL_TABLET | Freq: Every day | ORAL | 3 refills | Status: DC

## 2019-11-23 NOTE — Patient Instructions (Addendum)
Please change to Synthroid 100 mcg daily.  Take the thyroid hormone every day, with water, at least 30 minutes before breakfast, separated by at least 4 hours from: - acid reflux medications - calcium - iron - multivitamins  Please continue: - Metformin 1000 mg with dinner  Try to start: - Farxiga 5 mg before b'fast  Please return in 3-4 months.

## 2019-12-06 DIAGNOSIS — I10 Essential (primary) hypertension: Secondary | ICD-10-CM | POA: Diagnosis not present

## 2019-12-06 DIAGNOSIS — R42 Dizziness and giddiness: Secondary | ICD-10-CM | POA: Diagnosis not present

## 2019-12-26 DIAGNOSIS — G4733 Obstructive sleep apnea (adult) (pediatric): Secondary | ICD-10-CM | POA: Diagnosis not present

## 2020-02-14 DIAGNOSIS — Z124 Encounter for screening for malignant neoplasm of cervix: Secondary | ICD-10-CM | POA: Diagnosis not present

## 2020-02-14 DIAGNOSIS — N816 Rectocele: Secondary | ICD-10-CM | POA: Diagnosis not present

## 2020-02-14 DIAGNOSIS — E119 Type 2 diabetes mellitus without complications: Secondary | ICD-10-CM | POA: Diagnosis not present

## 2020-02-14 DIAGNOSIS — N952 Postmenopausal atrophic vaginitis: Secondary | ICD-10-CM | POA: Diagnosis not present

## 2020-03-28 ENCOUNTER — Ambulatory Visit: Payer: BC Managed Care – PPO | Admitting: Internal Medicine

## 2020-04-02 DIAGNOSIS — H25813 Combined forms of age-related cataract, bilateral: Secondary | ICD-10-CM | POA: Diagnosis not present

## 2020-04-02 DIAGNOSIS — H35371 Puckering of macula, right eye: Secondary | ICD-10-CM | POA: Diagnosis not present

## 2020-04-08 DIAGNOSIS — G4733 Obstructive sleep apnea (adult) (pediatric): Secondary | ICD-10-CM | POA: Diagnosis not present

## 2020-04-30 ENCOUNTER — Encounter: Payer: Self-pay | Admitting: Internal Medicine

## 2020-04-30 ENCOUNTER — Other Ambulatory Visit: Payer: Self-pay

## 2020-04-30 ENCOUNTER — Ambulatory Visit: Payer: BC Managed Care – PPO | Admitting: Internal Medicine

## 2020-04-30 VITALS — BP 124/72 | HR 77 | Ht 65.0 in | Wt 183.0 lb

## 2020-04-30 DIAGNOSIS — E119 Type 2 diabetes mellitus without complications: Secondary | ICD-10-CM | POA: Diagnosis not present

## 2020-04-30 DIAGNOSIS — E063 Autoimmune thyroiditis: Secondary | ICD-10-CM

## 2020-04-30 DIAGNOSIS — E669 Obesity, unspecified: Secondary | ICD-10-CM | POA: Diagnosis not present

## 2020-04-30 DIAGNOSIS — E038 Other specified hypothyroidism: Secondary | ICD-10-CM

## 2020-04-30 LAB — POCT GLYCOSYLATED HEMOGLOBIN (HGB A1C): Hemoglobin A1C: 6.3 % — AB (ref 4.0–5.6)

## 2020-04-30 NOTE — Progress Notes (Signed)
Patient ID: KEARSTEN GINTHER, female   DOB: [birth year], 74 y.o.   MRN: [id number]   This visit occurred during the SARS-CoV-2 public health emergency.  Safety protocols were in place, including screening questions prior to the visit, additional usage of staff PPE, and extensive cleaning of exam room while observing appropriate contact time as indicated for disinfecting solutions.   HPI  DAN DISSINGER is a 74 y.o.-year-old female, initially referred by her PCP, Dr.Tower, presenting for f/u for Hashimoto hypothyroidism and DM2, non-insulin-dependent, without long-term complications. She was seeing Dr Sharol Roussel [facility]) >> stopped seeing her 2/2 cost.  Last visit 5 months ago.  Hypothyroidism: - dx'ed "many years ago"  She tried: -Generic levothyroxine -Brand names -Then, Naturethroid 81.5 mg + Cytomel 5 mg bid (equivalent to ~180 mcg LT4)  -then, generic levothyroxine -Changed to brand name Synthroid 11/2019 -she is not sure whether she feels a change  Pt is on Synthroid 100 mcg daily, taken: - in am - fasting - at least 30 min from b'fast - + calcium with x2x a day - within 2 hrs from LT4 - no iron - + multivitamins x2 a day - within 2 hrs from LT4 - + PPIs with lunch - + A large dose of biotin (4 tablets - ? dose)  Reviewed her TFTs: 08/18/2019: TSH 0.951 11/15/2018: 1.086 Lab Results  Component Value Date   TSH 1.68 07/21/2018   TSH 1.24 08/19/2017   TSH 1.52 04/28/2016   TSH 2.02 01/16/2016   TSH 1.97 12/05/2015   TSH 1.27 11/19/2015   TSH 0.43 01/10/2015   TSH 1.12 11/26/2014   TSH 0.79 07/23/2014   TSH 1.04 05/08/2014   FREET4 1.02 07/21/2018   FREET4 0.99 08/19/2017   FREET4 0.88 04/28/2016   FREET4 0.86 01/16/2016   FREET4 1.02 11/19/2015   FREET4 1.33 01/10/2015   FREET4 1.23 07/23/2014   FREET4 1.02 05/08/2014   FREET4 1.4 10/08/2008  05/05/2017: TSH 1.233 total T4 9.4 11/02/2016: TSH 0.435, total T4 11.6  03/14/2014: ATA 313 (<2) TPO Abs 39  (<9) TSH 3.552 (0.35-4.5), free T4 0.82 (0.8-1.8), Free T3 2.6 (2.3-4.2), reverse T3 12 (8-25) At the same time, she hadn't hemoglobin A1c 6.3, fasting insulin 16.6, vitamin D 66  Pt denies: - feeling nodules in neck - hoarseness - dysphagia - choking - SOB with lying down  No FH of thyroid disease and thyroid cancer. No h/o radiation tx to head or neck.  No recent contrast studies. No herbal supplements. No Biotin use. No recent steroids use.   She was exercising at [employer], but not during the coronavirus pandemic.  Husband has prostate cancer.  DM2: -Diagnosed in 2016  Reviewed HbA1c levels: Lab Results  Component Value Date   HGBA1C 6.7 (A) 07/25/2019   HGBA1C 6.4 (A) 03/28/2019   HGBA1C 6.5 08/28/2016   HGBA1C 6.4 12/05/2015   HGBA1C 6.6 (H) 08/27/2015   HGBA1C 6.4 06/04/2015   HGBA1C 6.8 (H) 02/20/2015  11/15/2018: HbA1c 6.8% 05/17/2018: HbA1c 6.9%  She is on: - Metformin ER 500 mg with lunch or dinner >> 1000 mg with dinner - Farxiga 5 mg before breakfast-added 11/2019  She checks sugars 1-2 times a day - am: 122-162 >> 135-185, 257 >> 135-175 >> 130-150s, 170 - 2h after b'fast: n/c >> 131 >> n/c - lunch: 79 >> 103, 110 >> 86-159 >> 99-110 >> 98-101 - 2h after lunch: n/c >> 113 >> n/c >> 175-185 >> n/c - dinner: 106-132, 166 >>  95-119 >> n/c >> 99-115 >> 115-112 - 2h after dinner: 103-169, 175, 194 >> 163, 166, 247 >> n/c - bedtime: 104 >> n/c Lowest: 79 >> 95 >> 99 >> 90s Highest: 194 >> 247 >> 185 >> 170 >> 170s  She and her husband started walking after the holidays-walking 30 to 40 minutes a day.  No CKD: 08/18/2019: Glucose 173, BUN/Cr 14/0.9 11/15/2018: Glucose 122, BUN/Cr 18/0.8 Lab Results  Component Value Date   BUN 20 08/28/2016   BUN 16 12/05/2015   Lab Results  Component Value Date   CREATININE 0.83 08/28/2016   CREATININE 0.82 12/05/2015  On losartan.  + HL: 05/11/2019: 188/248/42.7/96 11/15/2018: 185/144/46.5/106 Lab  Results  Component Value Date   CHOL 195 08/28/2016   HDL 51.30 08/28/2016   LDLCALC 118 (H) 08/28/2016   LDLDIRECT 129.6 07/17/2011   TRIG 130.0 08/28/2016   CHOLHDL 4 08/28/2016  Not on a statin.  Last eye exam: 03/2019: No DR.  She had cataracts which were worsening, but not at the point of surgery.  Flu shot: 02/2019  ROS: Constitutional: no weight gain/no weight loss, no fatigue, no subjective hyperthermia, no subjective hypothermia Eyes: no blurry vision, no xerophthalmia ENT: no sore throat, + see HPI Cardiovascular: no CP/no SOB/no palpitations/no leg swelling Respiratory: no cough/no SOB/no wheezing Gastrointestinal: no N/no V/no D/no C/no acid reflux Musculoskeletal: no muscle aches/no joint aches Skin: no rashes, no hair loss Neurological: no tremors/no numbness/no tingling/no dizziness  I reviewed pt's medications, allergies, PMH, social hx, family hx, and changes were documented in the history of present illness. Otherwise, unchanged from my initial visit note.  Past Medical History:  Diagnosis Date  . Arthritis   . Barrett's esophagus   . Bone spur    Left foot / posterior  . Diabetes mellitus without complication ([place])   . Diverticulosis   . GERD (gastroesophageal reflux disease)    esophagitis with ulceration  . Hypertension   . Hypothyroidism   . Menopausal symptoms    hot flashes  . Osteopenia   . Sleep apnea 2007   AHI 45/hr in 2007   Past Surgical History:  Procedure Laterality Date  . ABDOMINAL HYSTERECTOMY  1983   partial/endometriosis  . BREAST EXCISIONAL BIOPSY Right   . BREAST SURGERY  1970's   breast lumpectomy/benign  . BREAST SURGERY  1994   breast implants/saline  . COLONOSCOPY WITH PROPOFOL N/A 04/20/2017   Procedure: COLONOSCOPY WITH PROPOFOL;  Surgeon: Lollie Sails, MD;  Location: [facility] ENDOSCOPY;  Service: Endoscopy;  Laterality: N/A;  . COLONOSCOPY WITH PROPOFOL N/A 06/17/2017   Procedure: COLONOSCOPY WITH PROPOFOL;   Surgeon: Lollie Sails, MD;  Location: [facility] ENDOSCOPY;  Service: Endoscopy;  Laterality: N/A;  . DORSAL COMPARTMENT RELEASE  05/14/2011   Procedure: RELEASE DORSAL COMPARTMENT (DEQUERVAIN);  Surgeon: Cammie Sickle., MD;  Location[facility];  Service: Orthopedics;  Laterality: Right;  right release dorsal compartment/ de quervain release with debridement of mucoid cyst  . ESOPHAGOGASTRODUODENOSCOPY  03/2008   esophagitis with ulcerations/?barretts  . ESOPHAGOGASTRODUODENOSCOPY (EGD) WITH PROPOFOL N/A 04/20/2017   Procedure: ESOPHAGOGASTRODUODENOSCOPY (EGD) WITH PROPOFOL;  Surgeon: Lollie Sails, MD;  Location[facility] ENDOSCOPY;  Service: Endoscopy;  Laterality: N/A;  . ESOPHAGOGASTRODUODENOSCOPY (EGD) WITH PROPOFOL  06/17/2017   Procedure: ESOPHAGOGASTRODUODENOSCOPY (EGD) WITH PROPOFOL;  Surgeon: Lollie Sails, MD;  Location: [facility] ENDOSCOPY;  Service: Endoscopy;;  . thumb nodule     removed  . TONSILLECTOMY     History  Social History  . Marital Status: Married    Spouse Name: N/A    Number of Children: 2   Occupational History  . retired   Social History Main Topics  . Smoking status: Never Smoker   . Smokeless tobacco: Never Used  . Alcohol Use: No  . Drug Use: No   Current Outpatient Medications on File Prior to Visit  Medication Sig Dispense Refill  . amLODipine (NORVASC) 5 MG tablet     . calcium-vitamin D (OSCAL WITH D) 500-200 MG-UNIT tablet Take 1 tablet by mouth.    . dapagliflozin propanediol (FARXIGA) 5 MG TABS tablet Take 5 mg by mouth daily before breakfast. 90 tablet 3  . losartan (COZAAR) 50 MG tablet Take 1 tablet (50 mg total) by mouth daily. 90 tablet 1  . metFORMIN (GLUCOPHAGE-XR) 500 MG 24 hr tablet Take 2 tablets (1,000 mg total) by mouth daily with breakfast. 180 tablet 1  . Multiple Vitamin (MULTIVITAMIN) tablet Take 1 tablet by mouth daily.    . pantoprazole (PROTONIX) 40 MG tablet TAKE 1 TABLET ONCE DAILY.  TAKE 30 TO 45  MINUTES BEFORE BREAKFAST    . SYNTHROID 100 MCG tablet Take 1 tablet (100 mcg total) by mouth daily before breakfast. 90 tablet 3   No current facility-administered medications on file prior to visit.   Allergies  Allergen Reactions  . Benzonatate     Hot flash  . Dilaudid [Hydromorphone Hcl] Other (See Comments)    Face turned red; bp   Family History  Problem Relation Age of Onset  . Hypertension Mother   . Hyperlipidemia Mother   . Colon polyps Mother   . Heart disease Mother   . Migraines Mother   . Cancer Father        lung CA  . Migraines Brother   . Cancer Maternal Aunt        breast CA  . Breast cancer Maternal Aunt    PE:  BP 124/72   Pulse 77   Ht 5\' 5"  (1.651 m)   Wt 183 lb (83 kg)   SpO2 97%   BMI 30.45 kg/m  Wt Readings from Last 3 Encounters:  04/30/20 183 lb (83 kg)  11/23/19 191 lb (86.6 kg)  07/25/19 193 lb (87.5 kg)   Constitutional: overweight, in NAD Eyes: PERRLA, EOMI, no exophthalmos ENT: moist mucous membranes, no thyromegaly, no cervical lymphadenopathy Cardiovascular: RRR, No MRG Respiratory: CTA B Gastrointestinal: abdomen soft, NT, ND, BS+ Musculoskeletal: no deformities, strength intact in all 4 Skin: moist, warm, no rashes Neurological: no tremor with outstretched hands, DTR normal in all 4  ASSESSMENT: 1. Hypothyroidism - Due to Hashimoto's thyroiditis  2. DM2, non-insulin-dependent  3.  Obesity class I  PLAN:  1. Patient with longstanding Hashimoto's hypothyroidism, on levothyroxine therapy. - latest thyroid labs reviewed with pt >> normal in 2021  - she continues on LT4 100 mcg daily.  At last visit, she was frustrated with hair loss and inability to lose weight.  She was interested in trying brand-name Synthroid.  We switched to this formulation.  We also discussed about Tirosint or compounded thyroid hormones at that time but decided to try brand-name Synthroid first. - pt feels good on this dose, but does not feel a  significant change in her hair loss.  We decided to continue on brand-name Synthroid until next visit. - we discussed about taking the thyroid hormone every day, with water, >30 minutes before breakfast, separated by >4 hours from  acid reflux medications, calcium, iron, multivitamins. Pt. is taking it correctly. - will check thyroid tests at next visit -advised her to stay off the biotin for 2 weeks before this visit to avoid interference with the assay  2.  DM2, non-insulin-dependent -At last visit, sugars were still high in the morning and only slightly better than before and they were also high after meals.  We discussed about checking some sugars at bedtime but we also added low-dose [place].  We discussed about benefits and possible side effects and I advised her to stay well-hydrated while on this medication -At today's visit, sugars are mostly at goal later in the day, but they are still high in the morning.  Upon questioning, she is having an early dinner but she always has a snack before bed.  Nowadays, she is eating a yogurt with granola/great nuts.  We discussed about stopping this even if this would mean moving dinner slightly later.  Alternatively, discussed about replacing the high carb snack with lower carb alternatives. - I advised her to: Patient Instructions  Please continue Synthroid 100 mcg daily.  Take the thyroid hormone every day, with water, at least 30 minutes before breakfast, separated by at least 4 hours from: - acid reflux medications - calcium - iron - multivitamins  Please continue: - Metformin 1000 mg with dinner - Farxiga 5 mg before b'fast  Try to stop snacking after dinner.  Stop Biotin for 2 weeks before next visit.  Please return in 4 months.  - we checked her HbA1c: 6.3% (improved) - advised to check sugars at different times of the day - 1x a day, rotating check times - advised for yearly eye exams >> she is UTD - return to clinic in 3-4  months  3.Obesity class I -We started an SGLT2 inhibitor at last visit and she tolerated the 5 mg of Farxiga well.  This is helping also with weight loss. -She was able to lose 9 pounds since last visit!  Philemon Kingdom, MD PhD [facility] Endocrinology

## 2020-04-30 NOTE — Patient Instructions (Addendum)
Please continue Synthroid 100 mcg daily.  Take the thyroid hormone every day, with water, at least 30 minutes before breakfast, separated by at least 4 hours from: - acid reflux medications - calcium - iron - multivitamins  Please continue: - Metformin 1000 mg with dinner - Farxiga 5 mg before b'fast  Try to stop snacking after dinner.  Stop Biotin for 2 weeks before next visit.  Please return in 4 months.

## 2020-04-30 NOTE — Addendum Note (Signed)
Addended by: Caprice Beaver T on: 04/30/2020 02:30 PM   Modules accepted: Orders

## 2020-05-08 DIAGNOSIS — Z85828 Personal history of other malignant neoplasm of skin: Secondary | ICD-10-CM | POA: Diagnosis not present

## 2020-05-08 DIAGNOSIS — L309 Dermatitis, unspecified: Secondary | ICD-10-CM | POA: Diagnosis not present

## 2020-05-08 DIAGNOSIS — L821 Other seborrheic keratosis: Secondary | ICD-10-CM | POA: Diagnosis not present

## 2020-05-08 DIAGNOSIS — L659 Nonscarring hair loss, unspecified: Secondary | ICD-10-CM | POA: Diagnosis not present

## 2020-05-13 DIAGNOSIS — H25812 Combined forms of age-related cataract, left eye: Secondary | ICD-10-CM | POA: Diagnosis not present

## 2020-05-23 DIAGNOSIS — I10 Essential (primary) hypertension: Secondary | ICD-10-CM | POA: Diagnosis not present

## 2020-05-23 DIAGNOSIS — E113299 Type 2 diabetes mellitus with mild nonproliferative diabetic retinopathy without macular edema, unspecified eye: Secondary | ICD-10-CM | POA: Diagnosis not present

## 2020-05-23 DIAGNOSIS — Z0000 Encounter for general adult medical examination without abnormal findings: Secondary | ICD-10-CM | POA: Diagnosis not present

## 2020-05-23 DIAGNOSIS — E038 Other specified hypothyroidism: Secondary | ICD-10-CM | POA: Diagnosis not present

## 2020-05-27 DIAGNOSIS — H52221 Regular astigmatism, right eye: Secondary | ICD-10-CM | POA: Diagnosis not present

## 2020-05-27 DIAGNOSIS — H25811 Combined forms of age-related cataract, right eye: Secondary | ICD-10-CM | POA: Diagnosis not present

## 2020-05-27 DIAGNOSIS — H35371 Puckering of macula, right eye: Secondary | ICD-10-CM | POA: Diagnosis not present

## 2020-06-03 DIAGNOSIS — Z79899 Other long term (current) drug therapy: Secondary | ICD-10-CM | POA: Diagnosis not present

## 2020-06-03 DIAGNOSIS — E785 Hyperlipidemia, unspecified: Secondary | ICD-10-CM | POA: Diagnosis not present

## 2020-06-03 DIAGNOSIS — I10 Essential (primary) hypertension: Secondary | ICD-10-CM | POA: Diagnosis not present

## 2020-06-18 DIAGNOSIS — Z9841 Cataract extraction status, right eye: Secondary | ICD-10-CM | POA: Diagnosis not present

## 2020-07-24 ENCOUNTER — Other Ambulatory Visit: Payer: Self-pay | Admitting: *Deleted

## 2020-07-24 ENCOUNTER — Telehealth: Payer: Self-pay | Admitting: Internal Medicine

## 2020-07-24 MED ORDER — LEVOTHYROXINE SODIUM 100 MCG PO TABS: 100.0000 ug | ORAL_TABLET | Freq: Every day | ORAL | 3 refills | Status: DC

## 2020-07-24 NOTE — Telephone Encounter (Signed)
REFILL REQUEST FOR:  Levothyroxine instead of Synthroid   PHARMACY  [facility], [place] Phone:  [phone]  Fax:  [phone]

## 2020-07-24 NOTE — Telephone Encounter (Signed)
Rx sent for Levothyroxine.

## 2020-08-09 ENCOUNTER — Other Ambulatory Visit: Payer: Self-pay | Admitting: Internal Medicine

## 2020-08-09 DIAGNOSIS — Z1231 Encounter for screening mammogram for malignant neoplasm of breast: Secondary | ICD-10-CM

## 2020-08-13 DIAGNOSIS — K2100 Gastro-esophageal reflux disease with esophagitis, without bleeding: Secondary | ICD-10-CM | POA: Diagnosis not present

## 2020-08-30 ENCOUNTER — Other Ambulatory Visit: Payer: Self-pay

## 2020-09-03 ENCOUNTER — Other Ambulatory Visit: Payer: Self-pay

## 2020-09-03 ENCOUNTER — Ambulatory Visit: Payer: BC Managed Care – PPO | Admitting: Internal Medicine

## 2020-09-03 ENCOUNTER — Encounter: Payer: Self-pay | Admitting: Internal Medicine

## 2020-09-03 VITALS — BP 120/80 | HR 67 | Ht 65.0 in | Wt 188.8 lb

## 2020-09-03 DIAGNOSIS — E669 Obesity, unspecified: Secondary | ICD-10-CM

## 2020-09-03 DIAGNOSIS — E119 Type 2 diabetes mellitus without complications: Secondary | ICD-10-CM

## 2020-09-03 DIAGNOSIS — E063 Autoimmune thyroiditis: Secondary | ICD-10-CM

## 2020-09-03 DIAGNOSIS — E038 Other specified hypothyroidism: Secondary | ICD-10-CM

## 2020-09-03 LAB — POCT GLYCOSYLATED HEMOGLOBIN (HGB A1C): Hemoglobin A1C: 6.8 % — AB (ref 4.0–5.6)

## 2020-09-03 NOTE — Patient Instructions (Addendum)
Please continue Synthroid 100 mcg daily.  Take the thyroid hormone every day, with water, at least 30 minutes before breakfast, separated by at least 4 hours from: - acid reflux medications - calcium - iron - multivitamins  Please increase: - Metformin 2000 mg with dinner  Continue: - Farxiga 5 mg before b'fast  Stop Biotin for 1-2 weeks before next visit.  Please return in 4 months.

## 2020-09-03 NOTE — Progress Notes (Signed)
Patient ID: [name], female   DOB: 1946/05/25, 75 y.o.   MRN: [id number]   This visit occurred during the SARS-CoV-2 public health emergency.  Safety protocols were in place, including screening questions prior to the visit, additional usage of staff PPE, and extensive cleaning of exam room while observing appropriate contact time as indicated for disinfecting solutions.   HPI  Rebecca Kramer is a 75 y.o.-year-old female, initially referred by her PCP, Dr.Tower, presenting for f/u for Hashimoto hypothyroidism and DM2, non-insulin-dependent, without long-term complications. She was seeing Dr Sharol Roussel [facility]) >> stopped seeing her 2/2 cost.  Last visit 4 months ago  Hypothyroidism: - dx'ed "many years ago"  She tried: -Generic levothyroxine -Brand names -Then, Naturethroid 81.5 mg + Cytomel 5 mg bid (equivalent to ~180 mcg LT4)  -then, generic levothyroxine -Changed to brand name Synthroid 11/2019 >> but generic LT4 since 08/2020 per insurance preference.  She takes LT4 100 mcg daily: - in am - fasting - at least 30 min from b'fast - + calcium within >4 hours from levothyroxine - no iron - + multivitamins >4 hours of levothyroxine - + PPIs with lunch - only as needed - + on Biotin (large dose: 1500 mcg)-advised to stop with before this visit >> stopped 2 weeks ago  Reviewed TFTs: 06/03/2020: TSH 1.554 08/18/2019: TSH 0.951 11/15/2018: 1.086 Lab Results  Component Value Date   TSH 1.68 07/21/2018   TSH 1.24 08/19/2017   TSH 1.52 04/28/2016   TSH 2.02 01/16/2016   TSH 1.97 12/05/2015   TSH 1.27 11/19/2015   TSH 0.43 01/10/2015   TSH 1.12 11/26/2014   TSH 0.79 07/23/2014   TSH 1.04 05/08/2014   FREET4 1.02 07/21/2018   FREET4 0.99 08/19/2017   FREET4 0.88 04/28/2016   FREET4 0.86 01/16/2016   FREET4 1.02 11/19/2015   FREET4 1.33 01/10/2015   FREET4 1.23 07/23/2014   FREET4 1.02 05/08/2014   FREET4 1.4 10/08/2008  05/05/2017: TSH 1.233 total T4  9.4 11/02/2016: TSH 0.435, total T4 11.6  03/14/2014: ATA 313 (<2) TPO Abs 39 (<9) TSH 3.552 (0.35-4.5), free T4 0.82 (0.8-1.8), Free T3 2.6 (2.3-4.2), reverse T3 12 (8-25) At the same time, she hadn't hemoglobin A1c 6.3, fasting insulin 16.6, vitamin D 66  Pt denies: - feeling nodules in neck - hoarseness - dysphagia - choking - SOB with lying down  No FH of thyroid disease or thyroid cancer. No h/o radiation tx to head or neck.  No seaweed or kelp. No recent contrast studies. No herbal supplements. No Biotin use. No recent steroids use.   She was exercising at [employer] but not during the coronavirus pandemic  Husband has prostate cancer.  DM2: -Diagnosed in 2016  Reviewed HbA1c levels: Lab Results  Component Value Date   HGBA1C 6.3 (A) 04/30/2020   HGBA1C 6.7 (A) 07/25/2019   HGBA1C 6.4 (A) 03/28/2019   HGBA1C 6.5 08/28/2016   HGBA1C 6.4 12/05/2015   HGBA1C 6.6 (H) 08/27/2015   HGBA1C 6.4 06/04/2015   HGBA1C 6.8 (H) 02/20/2015  11/15/2018: HbA1c 6.8% 05/17/2018: HbA1c 6.9%  She is on: - Metformin ER 500 mg with lunch or dinner >> 1000 mg with dinner-tolerated well - Farxiga 5 mg before breakfast-added 11/2019  She checks sugars once a day: - am:  135-175 >> 130-150s, 170 >> 135-181 - 2h after b'fast: n/c >> 131 >> n/c - lunch: 86-159 >> 99-110 >> 98-101 >> 138-184 - 2h after lunch: n/c >> 113 >> n/c >> 175-185 >>  n/c - dinner: n/c >> 99-115 >> 115-112 >> 115 - 2h after dinner: 1163, 166, 247 >> n/c >> 192, 240 - bedtime: 104 >> n/c Lowest: 99 >> 90s >> 135 Highest: 170s >> 240  She and her husband started walking after the holidays-walking 30 to 40 minutes a day. Continues walking.  No CKD: 06/03/2020: Glu 107, BUN/Cr 15/0.8 08/18/2019: Glucose 173, BUN/Cr 14/0.9 11/15/2018: Glucose 122, BUN/Cr 18/0.8 Lab Results  Component Value Date   BUN 20 08/28/2016   BUN 16 12/05/2015   Lab Results  Component Value Date   CREATININE 0.83 08/28/2016    CREATININE 0.82 12/05/2015  On losartan.  + HL: 06/03/2020: 206/127/52.7/128 05/11/2019: 188/248/42.7/96 11/15/2018: 185/144/46.5/106 Lab Results  Component Value Date   CHOL 195 08/28/2016   HDL 51.30 08/28/2016   LDLCALC 118 (H) 08/28/2016   LDLDIRECT 129.6 07/17/2011   TRIG 130.0 08/28/2016   CHOLHDL 4 08/28/2016  Not on a statin.  Last eye exam: 03/2019: No DR.  She had cataracts >> had surgery 05/2020.  ROS: Constitutional: no weight gain/no weight loss, no fatigue, no subjective hyperthermia, no subjective hypothermia Eyes: no blurry vision, no xerophthalmia ENT: no sore throat, + see HPI Cardiovascular: no CP/no SOB/no palpitations/no leg swelling Respiratory: no cough/no SOB/no wheezing Gastrointestinal: no N/no V/no D/no C/no acid reflux Musculoskeletal: no muscle aches/no joint aches Skin: no rashes, no hair loss Neurological: no tremors/no numbness/no tingling/no dizziness  I reviewed pt's medications, allergies, PMH, social hx, family hx, and changes were documented in the history of present illness. Otherwise, unchanged from my initial visit note.  Past Medical History:  Diagnosis Date  . Arthritis   . Barrett's esophagus   . Bone spur    Left foot / posterior  . Diabetes mellitus without complication ([place])   . Diverticulosis   . GERD (gastroesophageal reflux disease)    esophagitis with ulceration  . Hypertension   . Hypothyroidism   . Menopausal symptoms    hot flashes  . Osteopenia   . Sleep apnea 2007   AHI 45/hr in 2007   Past Surgical History:  Procedure Laterality Date  . ABDOMINAL HYSTERECTOMY  1983   partial/endometriosis  . BREAST EXCISIONAL BIOPSY Right   . BREAST SURGERY  1970's   breast lumpectomy/benign  . BREAST SURGERY  1994   breast implants/saline  . COLONOSCOPY WITH PROPOFOL N/A 04/20/2017   Procedure: COLONOSCOPY WITH PROPOFOL;  Surgeon: Lollie Sails, MD;  Location[facility] ENDOSCOPY;  Service: Endoscopy;  Laterality: N/A;   . COLONOSCOPY WITH PROPOFOL N/A 06/17/2017   Procedure: COLONOSCOPY WITH PROPOFOL;  Surgeon: Lollie Sails, MD;  Location[facility] ENDOSCOPY;  Service: Endoscopy;  Laterality: N/A;  . DORSAL COMPARTMENT RELEASE  05/14/2011   Procedure: RELEASE DORSAL COMPARTMENT (DEQUERVAIN);  Surgeon: Cammie Sickle., MD;  Location: [facility];  Service: Orthopedics;  Laterality: Right;  right release dorsal compartment/ de quervain release with debridement of mucoid cyst  . ESOPHAGOGASTRODUODENOSCOPY  03/2008   esophagitis with ulcerations/?barretts  . ESOPHAGOGASTRODUODENOSCOPY (EGD) WITH PROPOFOL N/A 04/20/2017   Procedure: ESOPHAGOGASTRODUODENOSCOPY (EGD) WITH PROPOFOL;  Surgeon: Lollie Sails, MD;  Location: [facility] ENDOSCOPY;  Service: Endoscopy;  Laterality: N/A;  . ESOPHAGOGASTRODUODENOSCOPY (EGD) WITH PROPOFOL  06/17/2017   Procedure: ESOPHAGOGASTRODUODENOSCOPY (EGD) WITH PROPOFOL;  Surgeon: Lollie Sails, MD;  Location: [facility] ENDOSCOPY;  Service: Endoscopy;;  . thumb nodule     removed  . TONSILLECTOMY     History   Social History  . Marital Status: Married  Spouse Name: N/A    Number of Children: 2   Occupational History  . retired   Social History Main Topics  . Smoking status: Never Smoker   . Smokeless tobacco: Never Used  . Alcohol Use: No  . Drug Use: No   Current Outpatient Medications on File Prior to Visit  Medication Sig Dispense Refill  . amLODipine (NORVASC) 5 MG tablet     . calcium-vitamin D (OSCAL WITH D) 500-200 MG-UNIT tablet Take 1 tablet by mouth.    . dapagliflozin propanediol (FARXIGA) 5 MG TABS tablet Take 5 mg by mouth daily before breakfast. 90 tablet 3  . levothyroxine (SYNTHROID) 100 MCG tablet Take 1 tablet (100 mcg total) by mouth daily before breakfast. 90 tablet 3  . losartan (COZAAR) 50 MG tablet Take 1 tablet (50 mg total) by mouth daily. 90 tablet 1  . metFORMIN (GLUCOPHAGE-XR) 500 MG 24 hr tablet Take 2 tablets (1,000 mg  total) by mouth daily with breakfast. 180 tablet 1  . Multiple Vitamin (MULTIVITAMIN) tablet Take 1 tablet by mouth daily.    . pantoprazole (PROTONIX) 40 MG tablet TAKE 1 TABLET ONCE DAILY.  TAKE 30 TO 45 MINUTES BEFORE BREAKFAST     No current facility-administered medications on file prior to visit.   Allergies  Allergen Reactions  . Benzonatate     Hot flash  . Dilaudid [Hydromorphone Hcl] Other (See Comments)    Face turned red; bp   Family History  Problem Relation Age of Onset  . Hypertension Mother   . Hyperlipidemia Mother   . Colon polyps Mother   . Heart disease Mother   . Migraines Mother   . Cancer Father        lung CA  . Migraines Brother   . Cancer Maternal Aunt        breast CA  . Breast cancer Maternal Aunt    PE:  BP 120/80   Pulse 67   Ht 5\' 5"  (1.651 m)   Wt 188 lb 12.8 oz (85.6 kg)   SpO2 97%   BMI 31.42 kg/m  Wt Readings from Last 3 Encounters:  09/03/20 188 lb 12.8 oz (85.6 kg)  04/30/20 183 lb (83 kg)  11/23/19 191 lb (86.6 kg)   Constitutional: overweight, in NAD Eyes: PERRLA, EOMI, no exophthalmos ENT: moist mucous membranes, no thyromegaly, no cervical lymphadenopathy Cardiovascular: RRR, No MRG Respiratory: CTA B Gastrointestinal: abdomen soft, NT, ND, BS+ Musculoskeletal: no deformities, strength intact in all 4 Skin: moist, warm, no rashes Neurological: no tremor with outstretched hands, DTR normal in all 4  ASSESSMENT: 1. Hypothyroidism - Due to Hashimoto's thyroiditis  2. DM2, non-insulin-dependent  3.  Obesity class I  PLAN:  1. Patient with longstanding Hashimoto's hypothyroidism, on levothyroxine therapy  - she was frustrated about inability to lose weight and we also discussed about Tirosint or compounded thyroid hormones at that time but decided to try brand-name Synthroid first.  This was recently not covered by her insurance and we switched to levothyroxine last month.  She tolerates this well. - latest thyroid labs  reviewed with pt - from 05/2020: Normal: TSH 1.554  - she continues on LT4 100 mcg daily - pt feels good on this dose. - we discussed about taking the thyroid hormone every day, with water, >30 minutes before breakfast, separated by >4 hours from acid reflux medications, calcium, iron, multivitamins. Pt. is taking it correctly. - will check thyroid tests today: TSH and fT4 - If labs  are abnormal, she will need to return for repeat TFTs in 1.5 months  2.  DM2, non-insulin-dependent -Patient with controlled diabetes on oral antidiabetic regimen with half-maximal doses of Metformin and SGLT2 inhibitor. -At last visit, sugars were mostly at goal later in the day but they were still high in the morning.  At that time, she was having an early dinner and a high glycemic index snack before bed (yogurt with granola/grape nuts).  I advised her to stop the snacks and moves dinner slightly later.  We also discussed about healthier snacks in case she continued them.   -At today's visit, sugars are higher in the morning despite restarting walking recently.  Upon questioning, she did not move into, as advised and she is on is not needed at night.  I again advised him to try to eat later, and we again discussed about changing the snacks. -However, for now, we will also increase the Metformin dose at night.  She tolerates the extended-release formulation well. - I advised her to: Patient Instructions  Please continue Synthroid 100 mcg daily.  Take the thyroid hormone every day, with water, at least 30 minutes before breakfast, separated by at least 4 hours from: - acid reflux medications - calcium - iron - multivitamins  Please increase: - Metformin 2000 mg with dinner  Continue: - Farxiga 5 mg before b'fast  Stop Biotin for 1-2 weeks before next visit.  Please return in 4 months.  - we checked her HbA1c: 6.8% (higher) - advised to check sugars at different times of the day - 1x a day, rotating check  times - advised for yearly eye exams >> she is UTD - return to clinic in 4 months  3.Obesity class I -She continues [place] which should also help with weight loss. -She lost 9 pounds before last visit, but gained 5 lbs since then - we again discussed about stopping snacks after dinner  Needs Metformin refills.  Philemon Kingdom, MD PhD [facility] Endocrinology

## 2020-09-13 DIAGNOSIS — G4733 Obstructive sleep apnea (adult) (pediatric): Secondary | ICD-10-CM | POA: Diagnosis not present

## 2020-09-19 ENCOUNTER — Other Ambulatory Visit: Payer: Self-pay | Admitting: Internal Medicine

## 2020-09-19 DIAGNOSIS — E119 Type 2 diabetes mellitus without complications: Secondary | ICD-10-CM

## 2020-09-19 NOTE — Telephone Encounter (Signed)
PLEASE REFILL -  Metformin (90 day) --- patient said Dr Cruzita Lederer changed dosage and now it needs to be 1 tablet 4x daily instead of 2. (she said it was supposed to be called in when she was here for her visit on 09/03/20.  PHARMACY -   [place], [place] Phone:  [phone]  Fax:  [phone]

## 2020-09-20 MED ORDER — METFORMIN HCL ER 500 MG PO TB24: 2000.0000 mg | ORAL_TABLET | Freq: Every day | ORAL | 3 refills | Status: DC

## 2020-09-26 ENCOUNTER — Ambulatory Visit
Admission: RE | Admit: 2020-09-26 | Discharge: 2020-09-26 | Disposition: A | Discharge status: Home / Self Care | Payer: BC Managed Care – PPO | Source: Ambulatory Visit | Attending: Internal Medicine | Admitting: Internal Medicine

## 2020-09-26 ENCOUNTER — Other Ambulatory Visit: Payer: Self-pay

## 2020-09-26 DIAGNOSIS — Z1231 Encounter for screening mammogram for malignant neoplasm of breast: Secondary | ICD-10-CM | POA: Diagnosis not present

## 2020-11-18 ENCOUNTER — Other Ambulatory Visit: Payer: Self-pay | Admitting: Internal Medicine

## 2020-11-18 DIAGNOSIS — R928 Other abnormal and inconclusive findings on diagnostic imaging of breast: Secondary | ICD-10-CM

## 2020-11-20 ENCOUNTER — Other Ambulatory Visit: Payer: Self-pay | Admitting: Internal Medicine

## 2020-11-20 DIAGNOSIS — E118 Type 2 diabetes mellitus with unspecified complications: Secondary | ICD-10-CM | POA: Diagnosis not present

## 2020-11-20 DIAGNOSIS — R1310 Dysphagia, unspecified: Secondary | ICD-10-CM

## 2020-11-20 DIAGNOSIS — Z79899 Other long term (current) drug therapy: Secondary | ICD-10-CM | POA: Diagnosis not present

## 2020-11-20 DIAGNOSIS — E7800 Pure hypercholesterolemia, unspecified: Secondary | ICD-10-CM | POA: Diagnosis not present

## 2020-11-20 DIAGNOSIS — R8290 Unspecified abnormal findings in urine: Secondary | ICD-10-CM | POA: Diagnosis not present

## 2020-11-20 DIAGNOSIS — I10 Essential (primary) hypertension: Secondary | ICD-10-CM | POA: Diagnosis not present

## 2020-11-20 DIAGNOSIS — E038 Other specified hypothyroidism: Secondary | ICD-10-CM | POA: Diagnosis not present

## 2020-11-26 DIAGNOSIS — M8588 Other specified disorders of bone density and structure, other site: Secondary | ICD-10-CM | POA: Diagnosis not present

## 2020-11-28 ENCOUNTER — Other Ambulatory Visit: Payer: Self-pay

## 2020-11-28 ENCOUNTER — Ambulatory Visit
Admission: RE | Admit: 2020-11-28 | Discharge: 2020-11-28 | Disposition: A | Discharge status: Home / Self Care | Payer: BC Managed Care – PPO | Source: Ambulatory Visit | Attending: Internal Medicine | Admitting: Internal Medicine

## 2020-11-28 DIAGNOSIS — K219 Gastro-esophageal reflux disease without esophagitis: Secondary | ICD-10-CM | POA: Diagnosis not present

## 2020-11-28 DIAGNOSIS — R1310 Dysphagia, unspecified: Secondary | ICD-10-CM | POA: Insufficient documentation

## 2021-01-03 ENCOUNTER — Ambulatory Visit: Payer: BC Managed Care – PPO | Admitting: Internal Medicine

## 2021-01-22 ENCOUNTER — Other Ambulatory Visit: Payer: Self-pay | Admitting: Internal Medicine

## 2021-02-26 DIAGNOSIS — M79675 Pain in left toe(s): Secondary | ICD-10-CM | POA: Diagnosis not present

## 2021-03-03 ENCOUNTER — Ambulatory Visit: Payer: BC Managed Care – PPO | Admitting: Internal Medicine

## 2021-03-03 ENCOUNTER — Other Ambulatory Visit: Payer: Self-pay

## 2021-03-03 ENCOUNTER — Encounter: Payer: Self-pay | Admitting: Internal Medicine

## 2021-03-03 VITALS — BP 118/76 | HR 79 | Ht 65.0 in | Wt 187.6 lb

## 2021-03-03 DIAGNOSIS — E038 Other specified hypothyroidism: Secondary | ICD-10-CM

## 2021-03-03 DIAGNOSIS — E669 Obesity, unspecified: Secondary | ICD-10-CM

## 2021-03-03 DIAGNOSIS — E119 Type 2 diabetes mellitus without complications: Secondary | ICD-10-CM | POA: Diagnosis not present

## 2021-03-03 DIAGNOSIS — E063 Autoimmune thyroiditis: Secondary | ICD-10-CM | POA: Diagnosis not present

## 2021-03-03 DIAGNOSIS — G4730 Sleep apnea, unspecified: Secondary | ICD-10-CM | POA: Diagnosis not present

## 2021-03-03 LAB — POCT GLYCOSYLATED HEMOGLOBIN (HGB A1C): Hemoglobin A1C: 6.1 % — AB (ref 4.0–5.6)

## 2021-03-03 NOTE — Progress Notes (Signed)
Patient ID: Rebecca Kramer, female   DOB: 1945-08-12, 75 y.o.   MRN: [id number]   This visit occurred during the SARS-CoV-2 public health emergency.  Safety protocols were in place, including screening questions prior to the visit, additional usage of staff PPE, and extensive cleaning of exam room while observing appropriate contact time as indicated for disinfecting solutions.   HPI  Rebecca Kramer is a 75 y.o.-year-old female, initially referred by her PCP, Dr.Tower, presenting for f/u for Hashimoto hypothyroidism and DM2, non-insulin-dependent, without long-term complications. She was seeing Dr Sharol Roussel [facility]) >> stopped seeing her 2/2 cost.  Last visit 5.5 months ago.  Interim history: No increased urination, blurry vision, nausea, chest pain. She fractured her L 5th toe before vacation. She just returned from vacation in [place].  Hypothyroidism: - dx'ed "many years ago"  She tried: -Generic levothyroxine -Brand names -Then, Naturethroid 81.5 mg + Cytomel 5 mg bid (equivalent to ~180 mcg LT4)  -then, generic levothyroxine -Changed to brand name Synthroid 11/2019 >> but generic LT4 since 08/2020 per insurance preference.  She takes LT4 100 mcg daily: - in am - fasting - at least 30 min from b'fast - + calcium within >4 hours from levothyroxine - no iron - + multivitamins >4 hours of levothyroxine - + PPIs with lunch - only as needed - + on Biotin (large dose: 1500 mcg)-advised to stop with before thyroid labs  Reviewed TFTs: 11/20/2020: TSH 1.615 06/03/2020: TSH 1.554 08/18/2019: TSH 0.951 11/15/2018: 1.086 Lab Results  Component Value Date   TSH 1.68 07/21/2018   TSH 1.24 08/19/2017   TSH 1.52 04/28/2016   TSH 2.02 01/16/2016   TSH 1.97 12/05/2015   TSH 1.27 11/19/2015   TSH 0.43 01/10/2015   TSH 1.12 11/26/2014   TSH 0.79 07/23/2014   TSH 1.04 05/08/2014   FREET4 1.02 07/21/2018   FREET4 0.99 08/19/2017   FREET4 0.88 04/28/2016   FREET4 0.86  01/16/2016   FREET4 1.02 11/19/2015   FREET4 1.33 01/10/2015   FREET4 1.23 07/23/2014   FREET4 1.02 05/08/2014   FREET4 1.4 10/08/2008  05/05/2017: TSH 1.233 total T4 9.4 11/02/2016: TSH 0.435, total T4 11.6  03/14/2014: ATA 313 (<2) TPO Abs 39 (<9) TSH 3.552 (0.35-4.5), free T4 0.82 (0.8-1.8), Free T3 2.6 (2.3-4.2), reverse T3 12 (8-25) At the same time, she hadn't hemoglobin A1c 6.3, fasting insulin 16.6, vitamin D 66  Pt denies: - feeling nodules in neck - hoarseness - dysphagia - choking - SOB with lying down  No FH of thyroid disease or thyroid cancer. No h/o radiation tx to head or neck.  No herbal supplements. No Biotin use. No recent steroids use.   She was exercising at [employer] but not during the coronavirus pandemic Husband has prostate cancer.  DM2: -Diagnosed in 2016  Reviewed HbA1c levels: Lab Results  Component Value Date   HGBA1C 6.8 (A) 09/03/2020   HGBA1C 6.3 (A) 04/30/2020   HGBA1C 6.7 (A) 07/25/2019   HGBA1C 6.4 (A) 03/28/2019   HGBA1C 6.5 08/28/2016   HGBA1C 6.4 12/05/2015   HGBA1C 6.6 (H) 08/27/2015   HGBA1C 6.4 06/04/2015   HGBA1C 6.8 (H) 02/20/2015  11/15/2018: HbA1c 6.8% 05/17/2018: HbA1c 6.9%  She is on: - Metformin ER 500 mg with lunch or dinner >> 1000 mg with dinner >> 2000 mg with dinner (increase 09/2020) - Farxiga 5 mg before breakfast-added 11/2019  She checks sugars once a day: - am:  135-175 >> 130-150s, 170 >> 135-181 >> 106-148, 184 (  missed Metformin) - 2h after b'fast: n/c >> 131 >> n/c - lunch: 86-159 >> 99-110 >> 98-101 >> 138-184 >> 120, 159 - 2h after lunch: n/c >> 113 >> n/c >> 175-185 >> n/c - dinner: n/c >> 99-115 >> 115-112 >> 115>> n/c - 2h after dinner: 163, 166, 247 >> n/c >> 192, 240 >> n/c >> 134 - bedtime: 104 >> n/c Lowest: 99 >> 90s >> 135 >> 106 Highest: 170s >> 240 >> 184  She and her husband started walking after the holidays-walking 30 to 40 minutes a day. Continues walking.  No  CKD: 11/20/2020 ([facility]): Glu 109, BUN/Cr 15/0.9, GFR 61 06/03/2020: Glu 107, BUN/Cr 15/0.8 08/18/2019: Glucose 173, BUN/Cr 14/0.9 11/15/2018: Glucose 122, BUN/Cr 18/0.8 Lab Results  Component Value Date   BUN 20 08/28/2016   BUN 16 12/05/2015   Lab Results  Component Value Date   CREATININE 0.83 08/28/2016   CREATININE 0.82 12/05/2015  On losartan.  + HL: 11/20/2020: 202/123/52.3/125 06/03/2020: 206/127/52.7/128 05/11/2019: 188/248/42.7/96 11/15/2018: 185/144/46.5/106 Lab Results  Component Value Date   CHOL 195 08/28/2016   HDL 51.30 08/28/2016   LDLCALC 118 (H) 08/28/2016   LDLDIRECT 129.6 07/17/2011   TRIG 130.0 08/28/2016   CHOLHDL 4 08/28/2016  Not on a statin.  Last eye exam: 2021: No DR.  She had cataracts >> had surgery 05/2020.  ROS: Constitutional: no weight gain/no weight loss, no fatigue, no subjective hyperthermia, no subjective hypothermia Eyes: no blurry vision, no xerophthalmia ENT: no sore throat, + see HPI Cardiovascular: no CP/no SOB/no palpitations/no leg swelling Respiratory: no cough/no SOB/no wheezing Gastrointestinal: no N/no V/no D/no C/no acid reflux Musculoskeletal: no muscle aches/no joint aches Skin: no rashes, no hair loss Neurological: no tremors/no numbness/no tingling/no dizziness  I reviewed pt's medications, allergies, PMH, social hx, family hx, and changes were documented in the history of present illness. Otherwise, unchanged from my initial visit note.  Past Medical History:  Diagnosis Date   Arthritis    Barrett's esophagus    Bone spur    Left foot / posterior   Diabetes mellitus without complication ([place])    Diverticulosis    GERD (gastroesophageal reflux disease)    esophagitis with ulceration   Hypertension    Hypothyroidism    Menopausal symptoms    hot flashes   Osteopenia    Sleep apnea 2007   AHI 45/hr in 2007   Past Surgical History:  Procedure Laterality Date   ABDOMINAL HYSTERECTOMY  1983    partial/endometriosis   BREAST EXCISIONAL BIOPSY Right    BREAST SURGERY  1970's   breast lumpectomy/benign   BREAST SURGERY  1994   breast implants/saline   COLONOSCOPY WITH PROPOFOL N/A 04/20/2017   Procedure: COLONOSCOPY WITH PROPOFOL;  Surgeon: Lollie Sails, MD;  Location: [facility] ENDOSCOPY;  Service: Endoscopy;  Laterality: N/A;   COLONOSCOPY WITH PROPOFOL N/A 06/17/2017   Procedure: COLONOSCOPY WITH PROPOFOL;  Surgeon: Lollie Sails, MD;  Location: [facility] ENDOSCOPY;  Service: Endoscopy;  Laterality: N/A;   DORSAL COMPARTMENT RELEASE  05/14/2011   Procedure: RELEASE DORSAL COMPARTMENT (DEQUERVAIN);  Surgeon: Cammie Sickle., MD;  Location: [facility];  Service: Orthopedics;  Laterality: Right;  right release dorsal compartment/ de quervain release with debridement of mucoid cyst   ESOPHAGOGASTRODUODENOSCOPY  03/2008   esophagitis with ulcerations/?barretts   ESOPHAGOGASTRODUODENOSCOPY (EGD) WITH PROPOFOL N/A 04/20/2017   Procedure: ESOPHAGOGASTRODUODENOSCOPY (EGD) WITH PROPOFOL;  Surgeon: Lollie Sails, MD;  Location: [facility] ENDOSCOPY;  Service: Endoscopy;  Laterality: N/A;  ESOPHAGOGASTRODUODENOSCOPY (EGD) WITH PROPOFOL  06/17/2017   Procedure: ESOPHAGOGASTRODUODENOSCOPY (EGD) WITH PROPOFOL;  Surgeon: Lollie Sails, MD;  Location: [facility] ENDOSCOPY;  Service: Endoscopy;;   thumb nodule     removed   TONSILLECTOMY     History   Social History   Marital Status: Married    Spouse Name: N/A    Number of Children: 2   Occupational History   retired   Social History Main Topics   Smoking status: Never Smoker    Smokeless tobacco: Never Used   Alcohol Use: No   Drug Use: No   Current Outpatient Medications on File Prior to Visit  Medication Sig Dispense Refill   amLODipine (NORVASC) 5 MG tablet      calcium-vitamin D (OSCAL WITH D) 500-200 MG-UNIT tablet Take 1 tablet by mouth.     FARXIGA 5 MG TABS tablet TAKE 1 TABLET DAILY BEFORE BREAKFAST 90  tablet 3   levothyroxine (SYNTHROID) 100 MCG tablet Take 1 tablet (100 mcg total) by mouth daily before breakfast. 90 tablet 3   losartan (COZAAR) 50 MG tablet Take 1 tablet (50 mg total) by mouth daily. 90 tablet 1   metFORMIN (GLUCOPHAGE-XR) 500 MG 24 hr tablet Take 4 tablets (2,000 mg total) by mouth daily with supper. 360 tablet 3   Multiple Vitamin (MULTIVITAMIN) tablet Take 1 tablet by mouth daily.     pantoprazole (PROTONIX) 40 MG tablet TAKE 1 TABLET ONCE DAILY.  TAKE 30 TO 45 MINUTES BEFORE BREAKFAST     No current facility-administered medications on file prior to visit.   Allergies  Allergen Reactions   Benzonatate     Hot flash   Dilaudid [Hydromorphone Hcl] Other (See Comments)    Face turned red; bp   Family History  Problem Relation Age of Onset   Hypertension Mother    Hyperlipidemia Mother    Colon polyps Mother    Heart disease Mother    Migraines Mother    Cancer Father        lung CA   Migraines Brother    Cancer Maternal Aunt        breast CA   Breast cancer Maternal Aunt    PE:  BP 118/76 (BP Location: Right Arm, Patient Position: Sitting, Cuff Size: Normal)   Pulse 79   Ht '5\' 5"'$  (1.651 m)   Wt 187 lb 9.6 oz (85.1 kg)   SpO2 96%   BMI 31.22 kg/m  Wt Readings from Last 3 Encounters:  03/03/21 187 lb 9.6 oz (85.1 kg)  09/03/20 188 lb 12.8 oz (85.6 kg)  04/30/20 183 lb (83 kg)   Constitutional: overweight, in NAD Eyes: PERRLA, EOMI, no exophthalmos ENT: moist mucous membranes, no thyromegaly, no cervical lymphadenopathy Cardiovascular: RRR, No MRG Respiratory: CTA B Gastrointestinal: abdomen soft, NT, ND, BS+ Musculoskeletal: no deformities, strength intact in all 4 Skin: moist, warm, no rashes Neurological: no tremor with outstretched hands, DTR normal in all 4  ASSESSMENT: 1. Hypothyroidism - Due to Hashimoto's thyroiditis  2. DM2, non-insulin-dependent  3.  Obesity class I  PLAN:  1. Patient with  longstanding Hashimoto's  hypothyroidism, on LT4 therapy  - she was previously on Synthroid but this was not covered by insurance so we switched from this to levothyroxine generic.  She tolerates this well. - latest thyroid labs reviewed with pt. >> normal on 11/20/2020 - she continues on LT4 100 mcg daily - pt feels good on this dose. - we discussed about taking the thyroid  hormone every day, with water, >30 minutes before breakfast, separated by >4 hours from acid reflux medications, calcium, iron, multivitamins. Pt. is taking it correctly.  2.  DM2, non-insulin-dependent -Patient with well-controlled diabetes, on oral antidiabetic regimen with metformin and SGLT2 inhibitor.  At last visit, we increased the metformin dose as sugars are higher in the morning despite restarting walking.  HbA1c was slightly higher, at 6.8%, increased from 6.3%. - at today's visit, sugars are mostly at goal with few slightly higher CBGs in am - no need to change her regimen now, I encoraged her to continue the higher Metformin dose (per her questioning) - I advised her to: Patient Instructions  Please continue: - Metformin 2000 mg with dinner - Farxiga 5 mg daily  Please continue Synthroid 100 mcg daily.  Take the thyroid hormone every day, with water, at least 30 minutes before breakfast, separated by at least 4 hours from: - acid reflux medications - calcium - iron - multivitamins  Please come back for a follow-up appointment in 6 months.   - we checked her HbA1c: 6.1% (lower) - advised to check sugars at different times of the day - 1x a day, rotating check times - advised for yearly eye exams >> she is UTD - return to clinic in 6 months  3.Obesity class I -We will continue [place] which should also help with weight loss -She gained 5 pounds before last visit, lost 9 pounds prior to this -At last visit we discussed about stopping snacks after dinner - lost 1 lb since last OV  Philemon Kingdom, MD PhD [facility]  Endocrinology

## 2021-03-03 NOTE — Patient Instructions (Addendum)
Please continue: - Metformin 2000 mg with dinner - Farxiga 5 mg daily  Please continue Synthroid 100 mcg daily.  Take the thyroid hormone every day, with water, at least 30 minutes before breakfast, separated by at least 4 hours from: - acid reflux medications - calcium - iron - multivitamins  Please come back for a follow-up appointment in 6 months.

## 2021-03-10 DIAGNOSIS — G4733 Obstructive sleep apnea (adult) (pediatric): Secondary | ICD-10-CM | POA: Diagnosis not present

## 2021-04-16 DIAGNOSIS — G4733 Obstructive sleep apnea (adult) (pediatric): Secondary | ICD-10-CM | POA: Diagnosis not present

## 2021-04-23 ENCOUNTER — Other Ambulatory Visit: Payer: Self-pay | Admitting: Internal Medicine

## 2021-04-24 ENCOUNTER — Telehealth: Payer: Self-pay | Admitting: Internal Medicine

## 2021-04-24 DIAGNOSIS — E119 Type 2 diabetes mellitus without complications: Secondary | ICD-10-CM

## 2021-04-24 MED ORDER — LEVOTHYROXINE SODIUM 100 MCG PO TABS: 100.0000 ug | ORAL_TABLET | Freq: Every day | ORAL | 3 refills | Status: DC

## 2021-04-24 NOTE — Addendum Note (Signed)
Addended by: Lauralyn Primes on: 04/24/2021 05:11 PM   Modules accepted: Orders

## 2021-04-24 NOTE — Telephone Encounter (Signed)
Rebecca Kramer with [facility] requests to be called at ph# [phone] Reference # [id number]  Re: RX that was received for SYNTHROID 100 MCG tablet  Rebecca Kramer states that the brand name for the above listed medication is not covered by Patient's insurance and the covered alternative that is covered by insurance is: L-thyroxine. Rebecca Kramer states that [facility] does use the name brand Synthroid as a the generic and by waiving the DAW or by allowing the generic substitution, PHARM would be able to send out the brand name medication at the generic copay.

## 2021-04-24 NOTE — Telephone Encounter (Signed)
Rx sent to preferred pharmacy.

## 2021-05-08 DIAGNOSIS — L820 Inflamed seborrheic keratosis: Secondary | ICD-10-CM | POA: Diagnosis not present

## 2021-05-08 DIAGNOSIS — L298 Other pruritus: Secondary | ICD-10-CM | POA: Diagnosis not present

## 2021-05-08 DIAGNOSIS — D2262 Melanocytic nevi of left upper limb, including shoulder: Secondary | ICD-10-CM | POA: Diagnosis not present

## 2021-05-08 DIAGNOSIS — D2272 Melanocytic nevi of left lower limb, including hip: Secondary | ICD-10-CM | POA: Diagnosis not present

## 2021-05-08 DIAGNOSIS — L538 Other specified erythematous conditions: Secondary | ICD-10-CM | POA: Diagnosis not present

## 2021-05-08 DIAGNOSIS — D2261 Melanocytic nevi of right upper limb, including shoulder: Secondary | ICD-10-CM | POA: Diagnosis not present

## 2021-05-08 DIAGNOSIS — Z85828 Personal history of other malignant neoplasm of skin: Secondary | ICD-10-CM | POA: Diagnosis not present

## 2021-05-28 DIAGNOSIS — Z01419 Encounter for gynecological examination (general) (routine) without abnormal findings: Secondary | ICD-10-CM | POA: Diagnosis not present

## 2021-05-28 DIAGNOSIS — R159 Full incontinence of feces: Secondary | ICD-10-CM | POA: Diagnosis not present

## 2021-05-28 DIAGNOSIS — Z1331 Encounter for screening for depression: Secondary | ICD-10-CM | POA: Diagnosis not present

## 2021-05-28 DIAGNOSIS — N816 Rectocele: Secondary | ICD-10-CM | POA: Diagnosis not present

## 2021-08-13 ENCOUNTER — Ambulatory Visit: Payer: BC Managed Care – PPO | Admitting: Dietician

## 2021-08-15 ENCOUNTER — Other Ambulatory Visit: Payer: Self-pay | Admitting: Internal Medicine

## 2021-08-15 DIAGNOSIS — E119 Type 2 diabetes mellitus without complications: Secondary | ICD-10-CM

## 2021-08-18 ENCOUNTER — Other Ambulatory Visit: Payer: Self-pay

## 2021-08-18 ENCOUNTER — Other Ambulatory Visit: Payer: Self-pay | Admitting: Internal Medicine

## 2021-08-18 DIAGNOSIS — Z1231 Encounter for screening mammogram for malignant neoplasm of breast: Secondary | ICD-10-CM

## 2021-08-18 DIAGNOSIS — E119 Type 2 diabetes mellitus without complications: Secondary | ICD-10-CM

## 2021-08-18 MED ORDER — METFORMIN HCL ER 500 MG PO TB24: 2000.0000 mg | ORAL_TABLET | Freq: Every day | ORAL | 0 refills | Status: DC

## 2021-08-18 NOTE — Telephone Encounter (Signed)
Pt left VM requested refill. Rx sent to preferred pharmacy.

## 2021-08-19 MED ORDER — LEVOTHYROXINE SODIUM 100 MCG PO TABS: 100.0000 ug | ORAL_TABLET | Freq: Every day | ORAL | 0 refills | Status: DC

## 2021-08-19 NOTE — Telephone Encounter (Signed)
Pt called back requesting Levothyroxine refill

## 2021-08-19 NOTE — Addendum Note (Signed)
Addended by: Lauralyn Primes on: 08/19/2021 09:00 AM   Modules accepted: Orders

## 2021-09-02 ENCOUNTER — Ambulatory Visit: Payer: BC Managed Care – PPO | Admitting: Internal Medicine

## 2021-09-02 ENCOUNTER — Encounter: Payer: Self-pay | Admitting: Internal Medicine

## 2021-09-02 ENCOUNTER — Other Ambulatory Visit: Payer: Self-pay

## 2021-09-02 VITALS — BP 120/80 | HR 82 | Ht 65.0 in | Wt 184.4 lb

## 2021-09-02 DIAGNOSIS — E119 Type 2 diabetes mellitus without complications: Secondary | ICD-10-CM | POA: Diagnosis not present

## 2021-09-02 DIAGNOSIS — E038 Other specified hypothyroidism: Secondary | ICD-10-CM

## 2021-09-02 DIAGNOSIS — E063 Autoimmune thyroiditis: Secondary | ICD-10-CM

## 2021-09-02 DIAGNOSIS — E669 Obesity, unspecified: Secondary | ICD-10-CM

## 2021-09-02 LAB — POCT GLYCOSYLATED HEMOGLOBIN (HGB A1C): Hemoglobin A1C: 6.7 % — AB (ref 4.0–5.6)

## 2021-09-02 MED ORDER — LEVOTHYROXINE SODIUM 100 MCG PO TABS: 100.0000 ug | ORAL_TABLET | Freq: Every day | ORAL | 3 refills | Status: DC

## 2021-09-02 MED ORDER — DAPAGLIFLOZIN PROPANEDIOL 5 MG PO TABS: 5.0000 mg | ORAL_TABLET | Freq: Every day | ORAL | 3 refills | Status: DC

## 2021-09-02 NOTE — Progress Notes (Signed)
Patient ID: Rebecca Kramer, female   DOB: 1946/01/15, 76 y.o.   MRN: [id number]   This visit occurred during the SARS-CoV-2 public health emergency.  Safety protocols were in place, including screening questions prior to the visit, additional usage of staff PPE, and extensive cleaning of exam room while observing appropriate contact time as indicated for disinfecting solutions.   HPI  Rebecca Kramer is a 76 y.o.-year-old female, initially referred by her PCP, Dr.Tower, presenting for f/u for Hashimoto hypothyroidism and DM2, non-insulin-dependent, without long-term complications. She was seeing Dr [facility]) >> stopped seeing her 2/2 cost.  Last visit 6 months ago.  Interim history: She had Covid earlier this mo >> sxs for 10 days including cough.  She now feels better. Otherwise, she has no complaints at this visit.   She would like to lose approximately 30 pounds, as advised by her pulmonologist to improve her sleep apnea. She tells me she stopped [place] since last visit to reduce her medication burden.  Hypothyroidism: - dx'ed "many years ago"  She tried: -Generic levothyroxine -Brand names -Then, Naturethroid 81.5 mg + Cytomel 5 mg bid (equivalent to ~180 mcg LT4)  -then, generic levothyroxine -Changed to brand name Synthroid 11/2019 >> but generic LT4 since 08/2020 per insurance preference.  She takes LT4 100 mcg daily: - in am - fasting - at least 30 min from b'fast - + calcium within >4 hours from levothyroxine - no iron - + multivitamins >4 hours of levothyroxine - + PPIs with lunch - only as needed - + on Biotin (large dose: 1500 mcg)-advised to stop with before thyroid labs  Reviewed TFTs: 05/27/2021: TSH 2.251 11/20/2020: TSH 1.615 06/03/2020: TSH 1.554 08/18/2019: TSH 0.951 11/15/2018: 1.086 Lab Results  Component Value Date   TSH 1.68 07/21/2018   TSH 1.24 08/19/2017   TSH 1.52 04/28/2016   TSH 2.02 01/16/2016   TSH 1.97 12/05/2015    TSH 1.27 11/19/2015   TSH 0.43 01/10/2015   TSH 1.12 11/26/2014   TSH 0.79 07/23/2014   TSH 1.04 05/08/2014   FREET4 1.02 07/21/2018   FREET4 0.99 08/19/2017   FREET4 0.88 04/28/2016   FREET4 0.86 01/16/2016   FREET4 1.02 11/19/2015   FREET4 1.33 01/10/2015   FREET4 1.23 07/23/2014   FREET4 1.02 05/08/2014   FREET4 1.4 10/08/2008  05/05/2017: TSH 1.233 total T4 9.4 11/02/2016: TSH 0.435, total T4 11.6  03/14/2014: ATA 313 (<2) TPO Abs 39 (<9) TSH 3.552 (0.35-4.5), free T4 0.82 (0.8-1.8), Free T3 2.6 (2.3-4.2), reverse T3 12 (8-25) At the same time, she hadn't hemoglobin A1c 6.3, fasting insulin 16.6, vitamin D 66  Pt denies: - feeling nodules in neck - hoarseness - dysphagia - choking - SOB with lying down  No FH of thyroid disease or thyroid cancer. No h/o radiation tx to head or neck.  No herbal supplements. No Biotin use. No recent steroids use.   She was exercising at [employer] but not during the coronavirus pandemic Husband has prostate cancer.  DM2: -Diagnosed in 2016  Reviewed HbA1c levels: Lab Results  Component Value Date   HGBA1C 6.1 (A) 03/03/2021   HGBA1C 6.8 (A) 09/03/2020   HGBA1C 6.3 (A) 04/30/2020   HGBA1C 6.7 (A) 07/25/2019   HGBA1C 6.4 (A) 03/28/2019   HGBA1C 6.5 08/28/2016   HGBA1C 6.4 12/05/2015   HGBA1C 6.6 (H) 08/27/2015   HGBA1C 6.4 06/04/2015   HGBA1C 6.8 (H) 02/20/2015  11/15/2018: HbA1c 6.8% 05/17/2018: HbA1c 6.9%  She is on: -  Metformin ER 500 mg with lunch or dinner >> 1000 mg with dinner >> 2000 mg with dinner (increase 09/2020) - Farxiga 5 mg before breakfast-added 11/2019 >> stopped as she was forgetting to take it  She checks sugars once a day: - am:135-181 >> 106-148, 184 (missed Metformin) >> 113-145, 151, 161 - 2h after b'fast: n/c >> 131 >> n/c - lunch: 86-159 >> 99-110 >> 98-101 >> 138-184 >> 120, 159 >> n/c - 2h after lunch: n/c >> 113 >> n/c >> 175-185 >> n/c >> 79, 123 - dinner: n/c >> 99-115 >> 115-112 >>  115>> n/c - 2h after dinner: 163, 166, 247 >> n/c >> 192, 240 >> n/c >> 134 >> n/c - bedtime: 104 >> n/c Lowest: 99 >> 90s >> 135 >> 106 >> 79 Highest: 170s >> 240 >> 184 >> 161  She and her husband started walking after the holidays-walking 30 to 40 minutes a day. Continues walking.  No CKD: 05/27/2021: 16/0.8, GFR 70, glucose 112 11/20/2020 ([facility]): Glu 109, BUN/Cr 15/0.9, GFR 61 06/03/2020: Glu 107, BUN/Cr 15/0.8 08/18/2019: Glucose 173, BUN/Cr 14/0.9 11/15/2018: Glucose 122, BUN/Cr 18/0.8 Lab Results  Component Value Date   BUN 20 08/28/2016   BUN 16 12/05/2015   Lab Results  Component Value Date   CREATININE 0.83 08/28/2016   CREATININE 0.82 12/05/2015  On losartan.  + HL: 05/27/2021: 195/152/45.5/119 11/20/2020: 202/123/52.3/125 06/03/2020: 206/127/52.7/128 05/11/2019: 188/248/42.7/96 11/15/2018: 185/144/46.5/106 Lab Results  Component Value Date   CHOL 195 08/28/2016   HDL 51.30 08/28/2016   LDLCALC 118 (H) 08/28/2016   LDLDIRECT 129.6 07/17/2011   TRIG 130.0 08/28/2016   CHOLHDL 4 08/28/2016  Not on a statin.  Last eye exam: 2021: No DR.  She had cataracts >> had surgery 05/2020.  Has appointment coming up next month.  She has sleep apnea - has a CPAP - does not like the machine.  ROS: + see HPI  I reviewed pt's medications, allergies, PMH, social hx, family hx, and changes were documented in the history of present illness. Otherwise, unchanged from my initial visit note.  Past Medical History:  Diagnosis Date   Arthritis    Barrett's esophagus    Bone spur    Left foot / posterior   Diabetes mellitus without complication ([place])    Diverticulosis    GERD (gastroesophageal reflux disease)    esophagitis with ulceration   Hypertension    Hypothyroidism    Menopausal symptoms    hot flashes   Osteopenia    Sleep apnea 2007   AHI 45/hr in 2007   Past Surgical History:  Procedure Laterality Date   ABDOMINAL HYSTERECTOMY  1983   partial/endometriosis    BREAST EXCISIONAL BIOPSY Right    BREAST SURGERY  1970's   breast lumpectomy/benign   BREAST SURGERY  1994   breast implants/saline   COLONOSCOPY WITH PROPOFOL N/A 04/20/2017   Procedure: COLONOSCOPY WITH PROPOFOL;  Surgeon: Lollie Sails, MD;  Location: [facility] ENDOSCOPY;  Service: Endoscopy;  Laterality: N/A;   COLONOSCOPY WITH PROPOFOL N/A 06/17/2017   Procedure: COLONOSCOPY WITH PROPOFOL;  Surgeon: Lollie Sails, MD;  Location: [facility] ENDOSCOPY;  Service: Endoscopy;  Laterality: N/A;   DORSAL COMPARTMENT RELEASE  05/14/2011   Procedure: RELEASE DORSAL COMPARTMENT (DEQUERVAIN);  Surgeon: Cammie Sickle., MD;  Location: [facility];  Service: Orthopedics;  Laterality: Right;  right release dorsal compartment/ de quervain release with debridement of mucoid cyst   ESOPHAGOGASTRODUODENOSCOPY  03/2008   esophagitis with ulcerations/?barretts  ESOPHAGOGASTRODUODENOSCOPY (EGD) WITH PROPOFOL N/A 04/20/2017   Procedure: ESOPHAGOGASTRODUODENOSCOPY (EGD) WITH PROPOFOL;  Surgeon: Lollie Sails, MD;  Location: [facility] ENDOSCOPY;  Service: Endoscopy;  Laterality: N/A;   ESOPHAGOGASTRODUODENOSCOPY (EGD) WITH PROPOFOL  06/17/2017   Procedure: ESOPHAGOGASTRODUODENOSCOPY (EGD) WITH PROPOFOL;  Surgeon: Lollie Sails, MD;  Location: [facility] ENDOSCOPY;  Service: Endoscopy;;   thumb nodule     removed   TONSILLECTOMY     History   Social History   Marital Status: Married    Spouse Name: N/A    Number of Children: 2   Occupational History   retired   Social History Main Topics   Smoking status: Never Smoker    Smokeless tobacco: Never Used   Alcohol Use: No   Drug Use: No   Current Outpatient Medications on File Prior to Visit  Medication Sig Dispense Refill   amLODipine (NORVASC) 5 MG tablet      Biotin 1 MG CAPS Use     calcium-vitamin D (OSCAL WITH D) 500-200 MG-UNIT tablet Take 1 tablet by mouth.     FARXIGA 5 MG TABS tablet TAKE 1 TABLET DAILY BEFORE BREAKFAST  90 tablet 3   levothyroxine (SYNTHROID) 100 MCG tablet Take 1 tablet (100 mcg total) by mouth daily before breakfast. 30 tablet 0   losartan (COZAAR) 50 MG tablet Take 1 tablet (50 mg total) by mouth daily. 90 tablet 1   metFORMIN (GLUCOPHAGE-XR) 500 MG 24 hr tablet Take 4 tablets (2,000 mg total) by mouth daily with supper. 360 tablet 0   Multiple Vitamin (MULTIVITAMIN) tablet Take 1 tablet by mouth daily.     pantoprazole (PROTONIX) 40 MG tablet TAKE 1 TABLET ONCE DAILY.  TAKE 30 TO 45 MINUTES BEFORE BREAKFAST     No current facility-administered medications on file prior to visit.   Allergies  Allergen Reactions   Benzonatate     Hot flash   Dilaudid [Hydromorphone Hcl] Other (See Comments)    Face turned red; bp   Family History  Problem Relation Age of Onset   Hypertension Mother    Hyperlipidemia Mother    Colon polyps Mother    Heart disease Mother    Migraines Mother    Cancer Father        lung CA   Migraines Brother    Cancer Maternal Aunt        breast CA   Breast cancer Maternal Aunt    PE: BP 120/80 (BP Location: Right Arm, Patient Position: Sitting, Cuff Size: Normal)    Pulse 82    Ht 5\' 5"  (1.651 m)    Wt 184 lb 6.4 oz (83.6 kg)    SpO2 96%    BMI 30.69 kg/m  Wt Readings from Last 3 Encounters:  09/02/21 184 lb 6.4 oz (83.6 kg)  03/03/21 187 lb 9.6 oz (85.1 kg)  09/03/20 188 lb 12.8 oz (85.6 kg)   Constitutional: overweight, in NAD Eyes: PERRLA, EOMI, no exophthalmos ENT: moist mucous membranes, no thyromegaly, no cervical lymphadenopathy Cardiovascular: RRR, No MRG Respiratory: CTA B Musculoskeletal: no deformities, strength intact in all 4 Skin: moist, warm, no rashes Neurological: no tremor with outstretched hands, DTR normal in all 4  ASSESSMENT: 1. Hypothyroidism - Due to Hashimoto's thyroiditis  2. DM2, non-insulin-dependent  3.  Obesity class I  PLAN:  1. Patient with longstanding Hashimoto's hypothyroidism, on LT4 therapy  - she was  previously on Synthroid but this was not covered by insurance so we switched from this to levothyroxine  generic.  She tolerates this well. - latest thyroid labs reviewed with pt. >> normal on 05/27/2021: 2.251 - she continues on LT4 100 mcg daily - pt feels good on this dose. - we discussed about taking the thyroid hormone every day, with water, >30 minutes before breakfast, separated by >4 hours from acid reflux medications, calcium, iron, multivitamins. Pt. is taking it correctly. - I refilled her levothyroxine  2.  DM2, non-insulin-dependent -This is well controlled, on oral regimen with metformin and SGLT2 inhibitor only -At last visit, sugars are mostly at goal, with only few slightly higher CBGs in the morning.  We did not change her regimen at that time.  HbA1c was still excellent, at 6.1%. -At today's visit, sugars are mostly at goal with occasional mild hyperglycemic spikes in the morning.  She is not usually checking later in the day.  She only has 1 in the last 2 months and this was at goal.  She did not check blood sugars during her COVID-19 infection and I advised her that this is highly recommended in case this happens again.   -As of now, she tells me that she is off farxiga as she wanted to reduce her pill burden.  However, I recommended to restart it again for its many benefits including weight loss.  She agrees to restart it. - I advised her to: Patient Instructions  Please continue: - Metformin 2000 mg with dinner - Farxiga 5 mg daily  Please continue Synthroid 100 mcg daily.  Take the thyroid hormone every day, with water, at least 30 minutes before breakfast, separated by at least 4 hours from: - acid reflux medications - calcium - iron - multivitamins  Please come back for a follow-up appointment in 6 months.  - we checked her HbA1c: 6.7% (higher) - advised to check sugars at different times of the day - 1x a day, rotating check times - advised for yearly eye exams  >> she is not UTD but has an appointment coming up - return to clinic in 6 months  3.Obesity class I -We did discuss in the past about stopping snacking after dinner -At this visit, she tells me that she would like to lose 30 pounds to hopefully improve her sleep apnea -I recommended to restart Farxiga -I also recommended a weight management clinic at [facility].  She will let me know if she wants a referral to the clinic.  Philemon Kingdom, MD PhD [facility] Endocrinology

## 2021-09-02 NOTE — Patient Instructions (Addendum)
Please continue: - Metformin 2000 mg with dinner  Please restart: - Farxiga 5 mg daily  Please continue Synthroid 100 mcg daily.  Take the thyroid hormone every day, with water, at least 30 minutes before breakfast, separated by at least 4 hours from: - acid reflux medications - calcium - iron - multivitamins  Please come back for a follow-up appointment in 6 months.

## 2021-09-09 ENCOUNTER — Telehealth: Payer: Self-pay

## 2021-09-09 NOTE — Telephone Encounter (Signed)
Called and left a detailed message for pt advising dr's recommendation.  ?

## 2021-09-09 NOTE — Telephone Encounter (Signed)
Pt called and lvm regarding medication Farxiga and Levothyroxine. Pt wants to know if she can take them both at the same time before breakfast or do they have to be 30 minutes apart when taken.  ?

## 2021-09-09 NOTE — Telephone Encounter (Signed)
T, ?I would not recommend to take levothyroxine with anything.  I would recommend to take levothyroxine first, and then wait approximately 30 minutes to take [place].  She can eat after another 10 to 15 minutes afterwards.  If it is not possible, she can wait less than 30 minutes between the 2 medications, but it would be better to separate them. ?

## 2021-09-17 ENCOUNTER — Other Ambulatory Visit: Payer: Self-pay

## 2021-09-17 ENCOUNTER — Encounter: Payer: BC Managed Care – PPO | Attending: Pulmonary Disease | Admitting: Dietician

## 2021-09-17 ENCOUNTER — Encounter: Payer: Self-pay | Admitting: Dietician

## 2021-09-17 VITALS — Ht 65.5 in | Wt 182.5 lb

## 2021-09-17 DIAGNOSIS — E669 Obesity, unspecified: Secondary | ICD-10-CM

## 2021-09-17 DIAGNOSIS — G4733 Obstructive sleep apnea (adult) (pediatric): Secondary | ICD-10-CM | POA: Diagnosis not present

## 2021-09-17 DIAGNOSIS — E119 Type 2 diabetes mellitus without complications: Secondary | ICD-10-CM

## 2021-09-17 NOTE — Patient Instructions (Signed)
Control portions of foods by measuring out appropriate amounts (1 cup or less of starches, palm-size of meats). Or use a smaller plate/ bowl.  ?Limit amounts of added fats such as butter, salad dressings, bacon ?Use fruit in combination with a protein food such as yogurt, 1/4 cup nuts, or low fat cheese for snacks 1-2 times a day as needed.  ?Continue to increase walking time back to typical 50 minutes.  ?

## 2021-09-17 NOTE — Progress Notes (Signed)
Medical Nutrition Therapy: Visit start time: 1100  end time: 1200  ?Assessment:  Diagnosis: Type 2 Diabetes ?Past medical history: sleep apnea, hypothyroidism, HTN, GERD ?Psychosocial issues/ stress concerns: none ? ?Preferred learning method:  ?Auditory ?Visual ?Hands-on ?No preference indicated ? ?Current weight: 182.5lbs Height: 5'5.5" BMI: 29.91 ? ?Medications, supplements: reconciled list in medical record ? ?Progress and evaluation:  ?Patient reports struggling with sleep apnea and using CPAP, wants to lose weight to help with sleep apnea  ?Reports weight gain over past 10 years; she and husband did keto diet 5 years ago and was able to lose weight, but unable to stay on the diet.  ?Does check BGs in am -- 125 today; 120-140; lower 100s later in the day. HbA1C 6.7  ? ?Physical activity: walking 20 minutes, 3-4 times a week, trying to increase to previous 50 minutes since having COVID about 1 month ago. ? ?Dietary Intake:  ?Usual eating pattern includes 2 meals and 1 snacks per day. ?Dining out frequency: 2-3 meals per week. ? ?Breakfast: time varies 9am -- fruit, starch ie grits, 1 c coffee; 2 mini spinach quiche made in muffin pan (no crust); eggs 1-2 times a week with bacon or sausage; cereal; occasionally skips food, does drink coffee ?Snack: none ?Lunch: 3pm was doing [employer] 3x a week -- veg + potato/ rice/ couscous + meat often fish/ chicken/ pork chop 1-2x a month, stopped recently to take a break; meat loaf/ pork roast with rice/ chicken bbq in crock pot/ baked chicken/ spaghetti/ pot roast in crock pot ?Snack: none ?Supper: none or snack ie nuts, occ with freeze dried fruit ie mango ?Snack: none ?Beverages: 8 glasses water daily; milk; orange/ apple/ grapefruit juice; occ soda when eating out ? ?Nutrition Care Education: ?Topics covered:  ?Basic nutrition: basic food groups, appropriate nutrient balance, appropriate meal and snack schedule, general nutrition guidelines    ?Weight control:  importance of low sugar and low fat choices; appropriate food portions, using smaller plates for portion control; estimated energy needs for weight loss at 1300 kcal, provided guidance for 45% CHO, 25%pro, 30% fat ?Advanced nutrition:  cooking techniques, balanced meal options for home cooking vs meal delivery ?Diabetes:  appropriate meal and snack schedule, appropriate carb intake and balance, healthy carb choices, role of fiber, protein, fat; role of physical activity ? ? ?Nutritional Diagnosis:  [place]-2.2 Altered nutrition-related laboratory As related to Type 2 diabetes.  As evidenced by elevated HbA1C. ?[place]-3.3 Overweight/obesity As related to hypothyroidism, history of excess calories.  As evidenced by patient with current BMI of 29.9, making diet and lifestyle changes to promote weight loss and control BGs. ? ?Intervention:  ?Instruction and discussion as noted above. ?Patient is making generally healthy eating choices; she feels work on portion control is key at this point. ?Established nutrition goals with direction from patient. ? ? ?Education Materials given:  ?Plate Planner with food lists, sample meal pattern ?Recipes ?Sample menus ?Snacking handout ?Visis summary with goals/ instructions ? ? ?Learner/ who was taught:  ?Patient  ? ?Level of understanding: ?Verbalizes/ demonstrates competency ? ? ?Demonstrated degree of understanding via:   Teach back ?Learning barriers: ?None ? ?Willingness to learn/ readiness for change: ?Eager, change in progress ? ? ?Monitoring and Evaluation:  Dietary intake, exercise, BG control, and body weight ?     follow up: 10/29/21 at 9:45am  ?

## 2021-09-23 ENCOUNTER — Telehealth: Payer: Self-pay | Admitting: Surgery

## 2021-09-23 NOTE — Telephone Encounter (Signed)
Left a message for the patient to call the office, patient needs an appt with Dr. Dahlia Byes in 2weeks.  ?

## 2021-09-29 ENCOUNTER — Ambulatory Visit: Payer: BC Managed Care – PPO

## 2021-09-30 ENCOUNTER — Ambulatory Visit
Admission: RE | Admit: 2021-09-30 | Discharge: 2021-09-30 | Disposition: A | Discharge status: Home / Self Care | Payer: BC Managed Care – PPO | Source: Ambulatory Visit | Attending: Internal Medicine | Admitting: Internal Medicine

## 2021-09-30 ENCOUNTER — Ambulatory Visit: Payer: BC Managed Care – PPO

## 2021-09-30 DIAGNOSIS — Z1231 Encounter for screening mammogram for malignant neoplasm of breast: Secondary | ICD-10-CM

## 2021-10-06 LAB — HM DIABETES EYE EXAM

## 2021-10-13 ENCOUNTER — Ambulatory Visit: Payer: BC Managed Care – PPO | Admitting: Surgery

## 2021-10-13 ENCOUNTER — Encounter: Payer: Self-pay | Admitting: Surgery

## 2021-10-13 VITALS — BP 128/75 | HR 79 | Temp 98.3°F | Ht 65.5 in | Wt 186.0 lb

## 2021-10-13 DIAGNOSIS — K449 Diaphragmatic hernia without obstruction or gangrene: Secondary | ICD-10-CM | POA: Diagnosis not present

## 2021-10-13 NOTE — Patient Instructions (Addendum)
We would like for you to have a CT scan of the abdomen and pelvis with contrast for assessment of your hiatal hernia.  ? ?You would need to have an Upper Endoscopy done before any surgery to correct your hiatal hernia. We will contact them to let them know we would like for you to have an endoscopy done.  ? ? ?You are scheduled for a CT scan at [facility] on Monday May 1st at 8:00 am. You will need to arrive at the [facility] by 7:30 am and have nothing to eat or drink for 4 hours prior. You will need to pick up a prep kit for this exam.  ? ?We will have you follow up here after these two studies are done.  ? ?Laparoscopic Nissen Fundoplication ?Laparoscopic Nissen fundoplication is surgery to relieve heartburn and other problems caused by gastric fluids flowing up into your esophagus. The esophagus is the tube that carries food and liquid from your throat to your stomach. Normally, the muscle that sits between your stomach and your esophagus (lower esophageal sphincter or LES) keeps stomach fluids in your stomach. ?In some people, the LES does not work properly, and stomach fluids flow up into the esophagus. This can happen when part of the stomach bulges through the LES (hiatal hernia). The backward flow of stomach fluids can cause a type of severe and long-standing heartburn that is called gastroesophageal reflux disease (GERD). You may need this surgery if other treatments for GERD have not helped. ?In a laparoscopic Nissen fundoplication, the upper part of your stomach is wrapped around the lower part of your esophagus to strengthen the LES and prevent reflux. If you have a hiatal hernia, it will also be repaired with this surgery. The procedure is done through several small incisions in your abdomen. It is performed using a thin, telescopic instrument (laparoscope) and other instruments that can pass through the scope or through other small incisions. ?Tell a health care provider about: ?Any allergies you  have. ?All medicines you are taking, including vitamins, herbs, eye drops, creams, and over-the-counter medicines. ?Any problems you or family members have had with anesthetic medicines. ?Any blood disorders you have. ?Any surgeries you have had. ?Any medical conditions you have. ?What are the risks? ?Generally, this is a safe procedure. However, problems may occur, including: ?Difficulty swallowing (dysphagia). ?Bloating. ?Nausea or vomiting. ?Damage to the lung, causing a collapsed lung. ?Infection or bleeding. ?What happens before the procedure? ?Ask your health care provider about: ?Changing or stopping your regular medicines. This is especially important if you are taking diabetes medicines or blood thinners. ?Taking medicines such as aspirin and ibuprofen. These medicines can thin your blood. Do not take these medicines before your procedure if your health care provider asks you not to. ?Follow your health care provider?s instructions about eating or drinking restrictions. ?Plan to have someone take you home after the procedure. ?What happens during the procedure? ?An IV tube will be inserted into one of your veins. It will be used to give you fluids and medicines during the procedure. ?You will be given a medicine that makes you fall asleep (general anesthetic). ?Your abdomen will be cleaned with a germ-killing solution (antiseptic). ?The surgeon will make a small incision in your abdomen and insert a tube through the incision. ?Your abdomen will be filled with a gas. This helps the surgeon to see your organs more easily and it makes more space to work. ?The surgeon will insert the laparoscope through the  incision. The scope has a camera that will send pictures to a monitor in the operating room. ?The surgeon will make several other small incisions in your abdomen to insert the other instruments that are needed during the procedure. ?Another instrument (dilator) will be passed through your mouth and down your  esophagus into the upper part of your stomach. The dilator will prevent your LES from being closed too tightly during surgery. ?The surgeon will pass the top portion of your stomach behind the lower part of your esophagus and wrap it all the way around. This will be stitched into place. ?If you have a hiatal hernia, it will be repaired during this procedure. ?All instruments will be removed, and your incisions will be closed under your skin with stitches (sutures). Skin adhesive strips may also be used. ?A bandage (dressing) will be placed on your skin over the incisions. ?The procedure may vary among health care providers and hospitals. ?What happens after the procedure? ?You will be moved to a recovery area. ?Your blood pressure, heart rate, breathing rate, and blood oxygen level will be monitored often until the medicines you were given have worn off. ?You will be given pain medicine as needed. ?Your IV tube will be kept in until you are able to drink fluids. ?This information is not intended to replace advice given to you by your health care provider. Make sure you discuss any questions you have with your health care provider. ?Document Released: 07/13/2014 Document Revised: 11/28/2015 Document Reviewed: 02/21/2014 ?Elsevier Interactive Patient Education ? 2017 [employer] ? ? ?Laparoscopic Nissen Fundoplication, Care After ?Refer to this sheet in the next few weeks. These instructions provide you with information about caring for yourself after your procedure. Your health care provider may also give you more specific instructions. Your treatment has been planned according to current medical practices, but problems sometimes occur. Call your health care provider if you have any problems or questions after your procedure. ?What can I expect after the procedure? ?After the procedure, it is common to have: ?Difficulty swallowing (dysphagia). ?Excess gas (bloating). ?Follow these instructions at home: ?Medicines   ?Take medicines only as directed by your health care provider. ?Do not drive or operate heavy machinery while taking pain medicine. ?Incision care  ?There are many different ways to close and cover an incision, including stitches (sutures), skin glue, and adhesive strips. Follow your health care provider's instructions about: ?Incision care. ?Bandage (dressing) changes and removal. ?Incision closure removal. ?Check your incision areas every day for signs of infection. Watch for: ?Redness, swelling, or pain. ?Fluid, blood, or pus. ?Do not take baths, swim, or use a hot tub until your health care provider approves. Take showers as directed by your health care provider. ?Eating and drinking  ?Follow your health care provider's instructions about eating. ?You may need to follow a liquid-only diet for 2 weeks, followed by a diet of soft foods for 2 weeks. ?You should return to your usual diet gradually. ?Drink enough fluid to keep your urine clear or pale yellow. ?Activity  ?Return to your normal activities as directed by your health care provider. Ask your health care provider what activities are safe for you. ?Avoid strenuous exercise. ?Do not lift anything that is heavier than 10 lb (4.5 kg). ?Ask your health care provider when you can: ?Return to sexual activity. ?Drive. ?Go back to work. ?Contact a health care provider if: ?You have a fever. ?Your pain gets worse or is not helped by medicine. ?You  have frequent nausea or vomiting. ?You have continued abdominal bloating. ?You have an ongoing (persistent) cough. ?You have redness, swelling, or pain in any incision areas. ?You have fluid, blood, or pus coming from any incisions. ?Get help right away if: ?You have trouble breathing. ?You are unable to swallow. ?You have persistent vomiting. ?You have blood in your vomit. ?You have severe abdominal pain. ?This information is not intended to replace advice given to you by your health care provider. Make sure you discuss  any questions you have with your health care provider. ?Document Released: 02/13/2004 Document Revised: 11/28/2015 Document Reviewed: 02/21/2014 ?Elsevier Interactive Patient Education ? 2017 [employer]. ?

## 2021-10-13 NOTE — Progress Notes (Signed)
Patient ID: Rebecca Kramer, female   DOB: [birth year], 76 y.o.   MRN: [id number] ? ?HPI ?Rebecca Kramer is a 76 y.o. female seen in consultation at the request of Dr.Aleskerov for hiatal hernia that has become symptomatic and has exacerbated pulmonary symptoms. ?She reports reflux that is worsening when she lays down.  She does have episode of regurgitation.  No evidence of dysphagia. ?SHe Did have an EGD and colonoscopy by Dr.Skulskie back in 2018 showing evidence of a moderate to large hiatal hernia with esophagitis please note I have personally reviewed the images.  She Does have a history of severe sleep apnea.  She is currently on pantoprazole and Carafate ?She did have barium swallow 7 months ago that have personally reviewed showing a small hiatal hernia and moderate GERD (personally reviewed) ?CBC and CMP normal.  She is able to perform more than 4 METS of activity without any shortness of breath or chest pain. ?Does have a prior history of abdominal hysterectomy ? ? ?HPI ? ?Past Medical History:  ?Diagnosis Date  ? Arthritis   ? Barrett's esophagus   ? Bone spur   ? Left foot / posterior  ? Diabetes mellitus without complication ([place])   ? Diverticulosis   ? GERD (gastroesophageal reflux disease)   ? esophagitis with ulceration  ? Hypertension   ? Hypothyroidism   ? Menopausal symptoms   ? hot flashes  ? Osteopenia   ? Sleep apnea 2007  ? AHI 45/hr in 2007  ? ? ?Past Surgical History:  ?Procedure Laterality Date  ? ABDOMINAL HYSTERECTOMY  1983  ? partial/endometriosis  ? BREAST EXCISIONAL BIOPSY Right   ? BREAST SURGERY  1970's  ? breast lumpectomy/benign  ? BREAST SURGERY  1994  ? breast implants/saline  ? COLONOSCOPY WITH PROPOFOL N/A 04/20/2017  ? Procedure: COLONOSCOPY WITH PROPOFOL;  Surgeon: Lollie Sails, MD;  Location: [facility] ENDOSCOPY;  Service: Endoscopy;  Laterality: N/A;  ? COLONOSCOPY WITH PROPOFOL N/A 06/17/2017  ? Procedure: COLONOSCOPY WITH PROPOFOL;  Surgeon: Lollie Sails, MD;   Location: [facility] ENDOSCOPY;  Service: Endoscopy;  Laterality: N/A;  ? DORSAL COMPARTMENT RELEASE  05/14/2011  ? Procedure: RELEASE DORSAL COMPARTMENT (DEQUERVAIN);  Surgeon: Cammie Sickle., MD;  Location: [facility];  Service: Orthopedics;  Laterality: Right;  right release dorsal compartment/ de quervain release with debridement of mucoid cyst  ? ESOPHAGOGASTRODUODENOSCOPY  03/2008  ? esophagitis with ulcerations/?barretts  ? ESOPHAGOGASTRODUODENOSCOPY (EGD) WITH PROPOFOL N/A 04/20/2017  ? Procedure: ESOPHAGOGASTRODUODENOSCOPY (EGD) WITH PROPOFOL;  Surgeon: Lollie Sails, MD;  Location: [facility] ENDOSCOPY;  Service: Endoscopy;  Laterality: N/A;  ? ESOPHAGOGASTRODUODENOSCOPY (EGD) WITH PROPOFOL  06/17/2017  ? Procedure: ESOPHAGOGASTRODUODENOSCOPY (EGD) WITH PROPOFOL;  Surgeon: Lollie Sails, MD;  Location[facility] ENDOSCOPY;  Service: Endoscopy;;  ? thumb nodule    ? removed  ? TONSILLECTOMY    ? ? ?Family History  ?Problem Relation Age of Onset  ? Hypertension Mother   ? Hyperlipidemia Mother   ? Colon polyps Mother   ? Heart disease Mother   ? Migraines Mother   ? Cancer Father   ?     lung CA  ? Migraines Brother   ? Cancer Maternal Aunt   ?     breast CA  ? Breast cancer Maternal Aunt   ? ? ?Social History ?Social History  ? ?Tobacco Use  ? Smoking status: Never  ?  Passive exposure: Past  ? Smokeless tobacco: Never  ?Vaping Use  ?  Vaping Use: Never used  ?Substance Use Topics  ? Alcohol use: No  ?  Alcohol/week: 0.0 standard drinks  ? Drug use: No  ? ? ?Allergies  ?Allergen Reactions  ? Benzonatate   ?  Hot flash  ? Dilaudid [Hydromorphone Hcl] Other (See Comments)  ?  Face turned red; bp  ? ? ?Current Outpatient Medications  ?Medication Sig Dispense Refill  ? amLODipine (NORVASC) 5 MG tablet     ? calcium-vitamin D (OSCAL WITH D) 500-200 MG-UNIT tablet Take 1 tablet by mouth.    ? dapagliflozin propanediol (FARXIGA) 5 MG TABS tablet Take 1 tablet (5 mg total) by mouth daily before  breakfast. 90 tablet 3  ? levothyroxine (SYNTHROID) 100 MCG tablet Take 1 tablet (100 mcg total) by mouth daily before breakfast. 90 tablet 3  ? losartan (COZAAR) 50 MG tablet Take 1 tablet (50 mg total) by mouth daily. (Patient taking differently: Take 50 mg by mouth 2 times daily at 12 noon and 4 pm.) 90 tablet 1  ? metFORMIN (GLUCOPHAGE-XR) 500 MG 24 hr tablet Take 4 tablets (2,000 mg total) by mouth daily with supper. 360 tablet 0  ? Multiple Vitamin (MULTIVITAMIN) tablet Take 1 tablet by mouth daily.    ? pantoprazole (PROTONIX) 40 MG tablet TAKE 1 TABLET ONCE DAILY.  TAKE 30 TO 45 MINUTES BEFORE BREAKFAST    ? ?No current facility-administered medications for this visit.  ? ? ? ?Review of Systems ?Full ROS  was asked and was negative except for the information on the HPI ? ?Physical Exam ?Blood pressure 128/75, pulse 79, temperature 98.3 ?F (36.8 ?C), height 5' 5.5" (1.664 m), weight 186 lb (84.4 kg), SpO2 97 %. ?CONSTITUTIONAL: NAD. ?EYES: Pupils are equal, round,  Sclera are non-icteric. ?EARS, NOSE, MOUTH AND THROAT: The oropharynx is clear. The oral mucosa is pink and moist. Hearing is intact to voice. ?LYMPH NODES:  Lymph nodes in the neck are normal. ?RESPIRATORY:  Lungs are clear. There is normal respiratory effort, with equal breath sounds bilaterally, and without pathologic use of accessory muscles. ?CARDIOVASCULAR: Heart is regular without murmurs, gallops, or rubs. ?GI: The abdomen is  soft, nontender, and nondistended. There are no palpable masses. There is no hepatosplenomegaly. There are normal bowel sounds in all quadrants. ?GU: Rectal deferred.   ?MUSCULOSKELETAL: Normal muscle strength and tone. No cyanosis or edema.   ?SKIN: Turgor is good and there are no pathologic skin lesions or ulcers. ?NEUROLOGIC: Motor and sensation is grossly normal. Cranial nerves are grossly intact. ?PSYCH:  Oriented to person, place and time. Affect is normal. ? ?Data Reviewed ? ?I have personally reviewed the  patient's imaging, laboratory findings and medical records.   ? ?Assessment/Plan ?76 year old female with moderate to large size hiatal hernia and significance reflux symptoms as well as pulmonary symptoms. ?I Discussed with patient in detail.  I do think that she will benefit from hiatal hernia repair and fundoplication at some point in time.  We will have to perform appropriate work-up in this form of repeat endoscopic evaluation as well as a CT scan of the abdomen pelvis. ?Discussed with her in detail about different approaches to include robotic approach propatent hernia repair.  She is interested in pursuing further work-up. ?Please note that I have spent 60 minutes in this encounter including reviewing medical records, imaging studies, coordinating his care, placing orders and performing appropriate documentation ? ?Caroleen Hamman, MD FACS ?General Surgeon ?10/13/2021, 4:50 PM ? ?  ?

## 2021-10-22 IMAGING — US US BREAST*R* LIMITED INC AXILLA
1 series · 7 of 7 positions shown · non-contrast
Comparison: Previous exam(s).

CLINICAL DATA: The patient was called back for a right breast mass.

EXAM:
DIGITAL DIAGNOSTIC RIGHT MAMMOGRAM WITH TOMO
ULTRASOUND RIGHT BREAST

[Series 1: us breast*right* limited inc axilla · 0.06mm/px · 7 of 7 slices shown]
[im 1/7]
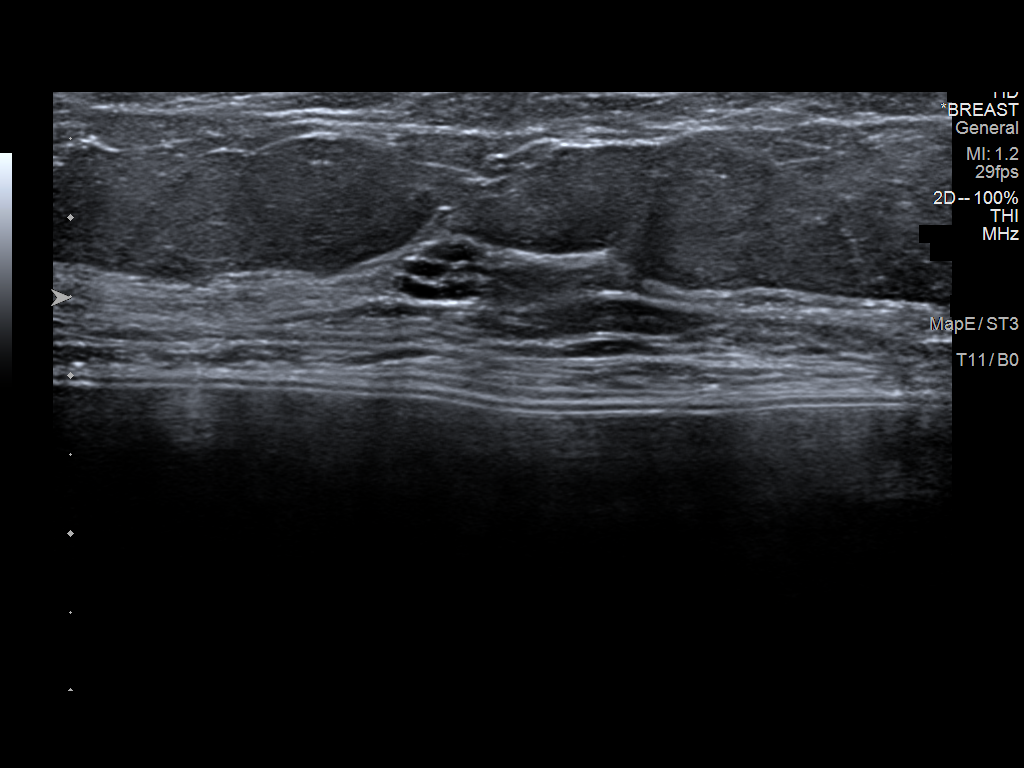
[im 2/7]
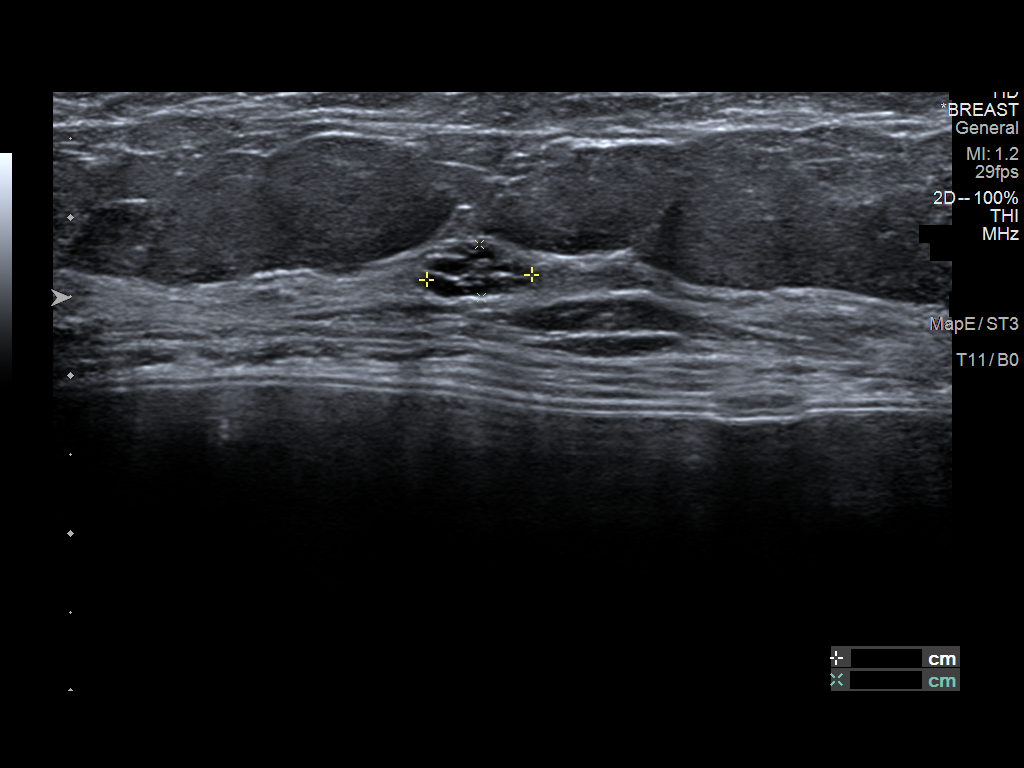
[im 3/7]
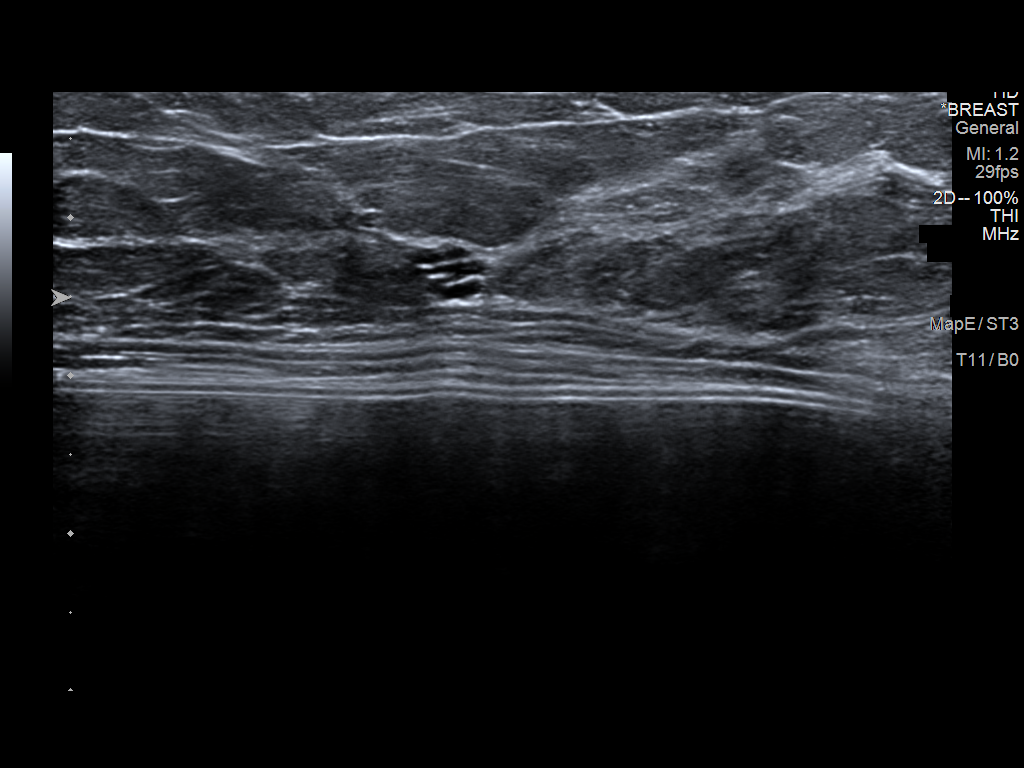
[im 4/7]
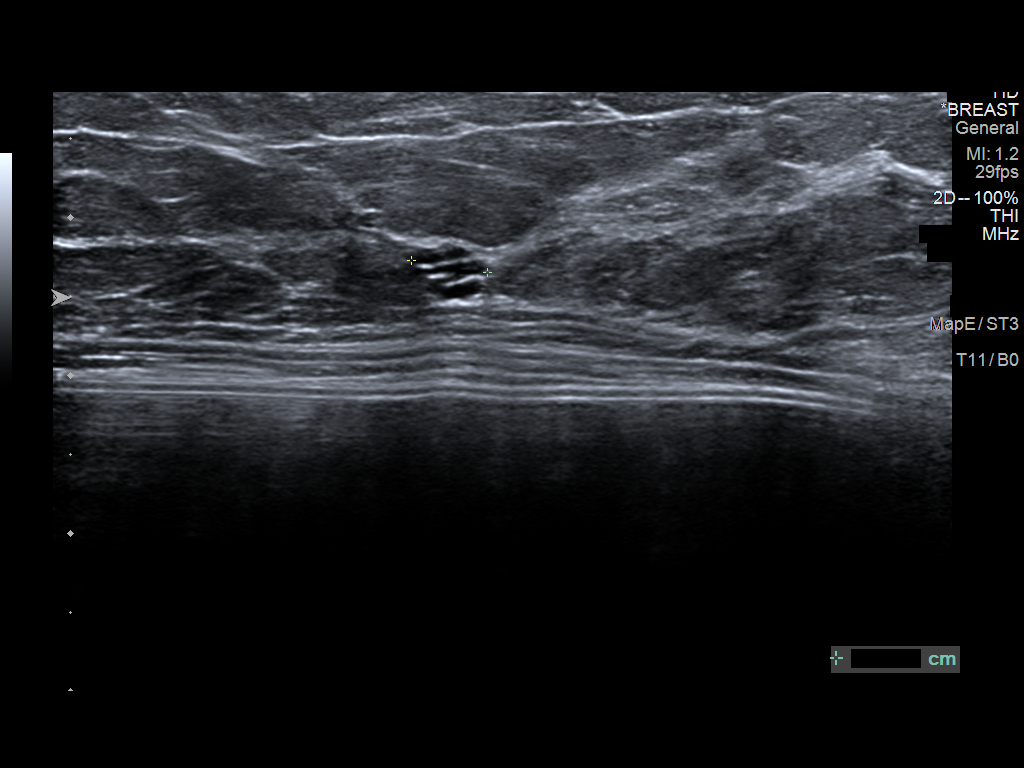
[im 5/7]
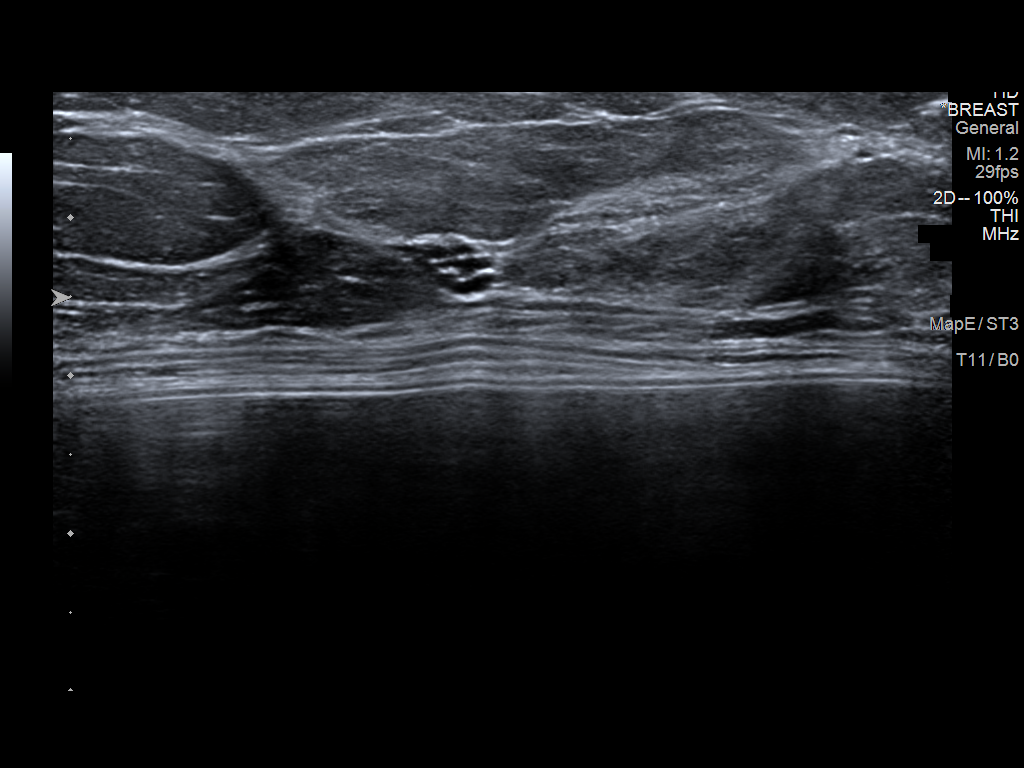
[im 6/7]
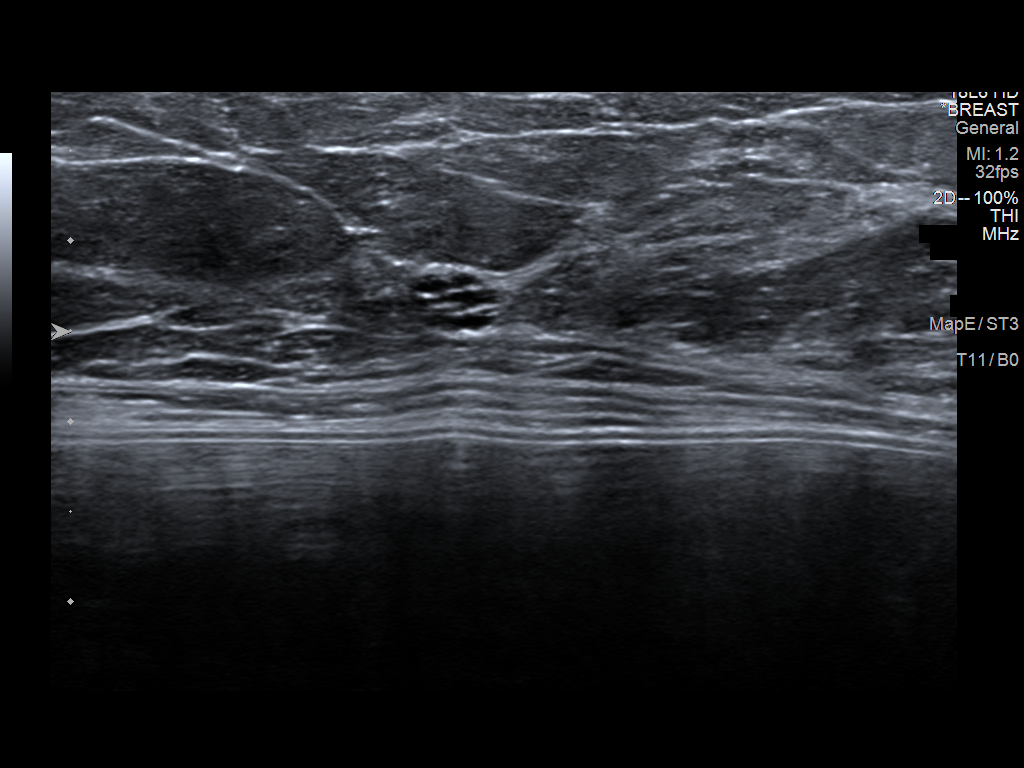
[im 7/7]
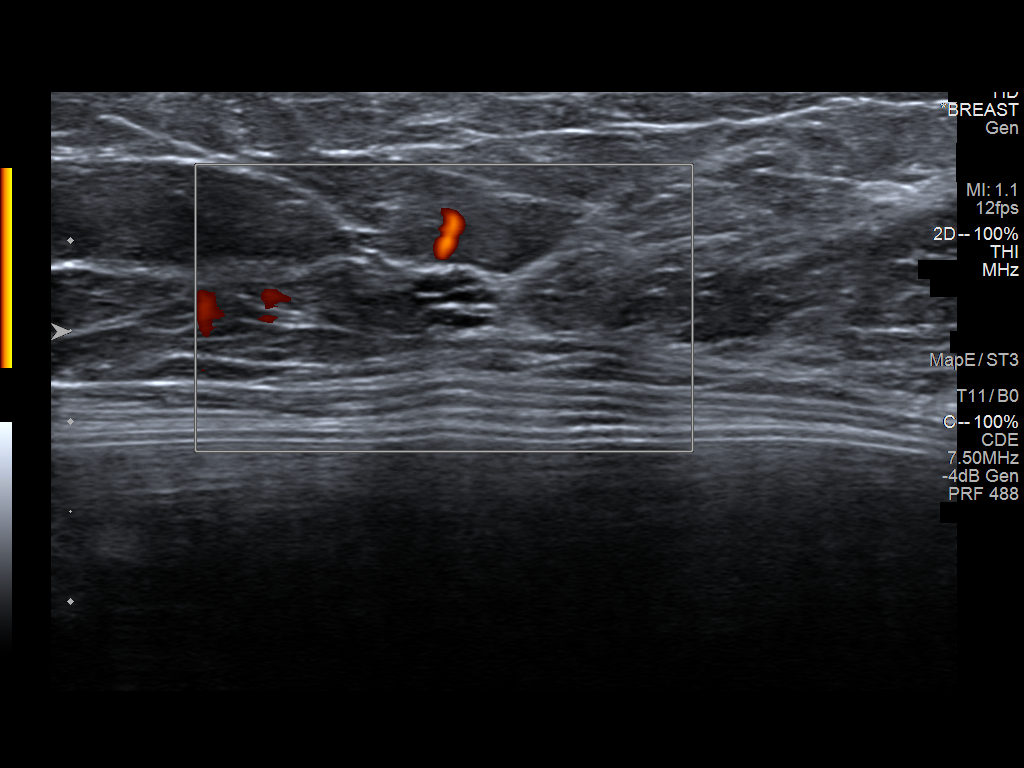

[7 of 7 positions shown; findings below may reference images not displayed]

ACR Breast Density Category b: There are scattered areas of
fibroglandular density.
FINDINGS: The mass in the upper outer right breast persists on additional
imaging.

On physical exam, no suspicious lumps are identified.

Targeted ultrasound is performed, showing a cluster of cysts at 10
o'clock in the right breast.
IMPRESSION: Fibrocystic changes.  No evidence of malignancy.

RECOMMENDATION:
Annual screening mammography.

I have discussed the findings and recommendations with the patient.
If applicable, a reminder letter will be sent to the patient
regarding the next appointment.

BI-RADS CATEGORY  2: Benign.

## 2021-10-29 ENCOUNTER — Ambulatory Visit: Payer: BC Managed Care – PPO | Admitting: Dietician

## 2021-10-29 ENCOUNTER — Other Ambulatory Visit: Payer: Self-pay | Admitting: Internal Medicine

## 2021-10-29 DIAGNOSIS — E119 Type 2 diabetes mellitus without complications: Secondary | ICD-10-CM

## 2021-11-03 ENCOUNTER — Ambulatory Visit
Admission: RE | Admit: 2021-11-03 | Discharge: 2021-11-03 | Disposition: A | Discharge status: Home / Self Care | Payer: BC Managed Care – PPO | Source: Ambulatory Visit | Attending: Surgery | Admitting: Surgery

## 2021-11-03 DIAGNOSIS — K449 Diaphragmatic hernia without obstruction or gangrene: Secondary | ICD-10-CM | POA: Diagnosis not present

## 2021-11-03 LAB — POCT I-STAT CREATININE: Creatinine, Ser: 1 mg/dL (ref 0.44–1.00)

## 2021-11-03 MED ORDER — IOHEXOL 300 MG/ML  SOLN
100.0000 mL | Freq: Once | INTRAMUSCULAR | Status: AC | PRN
  Administered 2021-11-03: 100 mL via INTRAVENOUS

## 2021-12-08 ENCOUNTER — Encounter: Payer: Self-pay | Admitting: Dietician

## 2021-12-08 NOTE — Progress Notes (Signed)
Have not heard back from patient to reschedule her cancelled appointment from 10/29/21. Sent notification to referring provider.

## 2022-01-15 ENCOUNTER — Encounter: Payer: Self-pay | Admitting: *Deleted

## 2022-01-16 ENCOUNTER — Encounter: Payer: Self-pay | Admitting: *Deleted

## 2022-01-16 ENCOUNTER — Ambulatory Visit: Payer: BC Managed Care – PPO | Admitting: Certified Registered Nurse Anesthetist

## 2022-01-16 ENCOUNTER — Ambulatory Visit
Admission: RE | Admit: 2022-01-16 | Discharge: 2022-01-16 | Disposition: A | Discharge status: Home / Self Care | Payer: BC Managed Care – PPO | Attending: Gastroenterology | Admitting: Gastroenterology

## 2022-01-16 ENCOUNTER — Encounter
Admission: RE | Disposition: A | Discharge status: Home / Self Care | Payer: Self-pay | Source: Home / Self Care | Attending: Gastroenterology

## 2022-01-16 DIAGNOSIS — G4730 Sleep apnea, unspecified: Secondary | ICD-10-CM | POA: Diagnosis not present

## 2022-01-16 DIAGNOSIS — K2100 Gastro-esophageal reflux disease with esophagitis, without bleeding: Secondary | ICD-10-CM | POA: Insufficient documentation

## 2022-01-16 DIAGNOSIS — Z1211 Encounter for screening for malignant neoplasm of colon: Secondary | ICD-10-CM | POA: Insufficient documentation

## 2022-01-16 DIAGNOSIS — K449 Diaphragmatic hernia without obstruction or gangrene: Secondary | ICD-10-CM | POA: Insufficient documentation

## 2022-01-16 DIAGNOSIS — D123 Benign neoplasm of transverse colon: Secondary | ICD-10-CM | POA: Diagnosis not present

## 2022-01-16 DIAGNOSIS — E119 Type 2 diabetes mellitus without complications: Secondary | ICD-10-CM | POA: Insufficient documentation

## 2022-01-16 DIAGNOSIS — Z86010 Personal history of colonic polyps: Secondary | ICD-10-CM | POA: Insufficient documentation

## 2022-01-16 DIAGNOSIS — R1110 Vomiting, unspecified: Secondary | ICD-10-CM | POA: Insufficient documentation

## 2022-01-16 DIAGNOSIS — E039 Hypothyroidism, unspecified: Secondary | ICD-10-CM | POA: Insufficient documentation

## 2022-01-16 DIAGNOSIS — K317 Polyp of stomach and duodenum: Secondary | ICD-10-CM | POA: Insufficient documentation

## 2022-01-16 HISTORY — DX: Pure hypercholesterolemia, unspecified: E78.00

## 2022-01-16 HISTORY — PX: COLONOSCOPY WITH PROPOFOL: SHX5780

## 2022-01-16 HISTORY — PX: ESOPHAGOGASTRODUODENOSCOPY (EGD) WITH PROPOFOL: SHX5813

## 2022-01-16 SURGERY — COLONOSCOPY WITH PROPOFOL
Anesthesia: General

## 2022-01-16 MED ORDER — SODIUM CHLORIDE 0.9 % IV SOLN: INTRAVENOUS | Status: DC

## 2022-01-16 MED ORDER — PROPOFOL 500 MG/50ML IV EMUL
INTRAVENOUS | Status: DC | PRN
  Administered 2022-01-16: 150 ug/kg/min via INTRAVENOUS

## 2022-01-16 MED ORDER — PROPOFOL 1000 MG/100ML IV EMUL
INTRAVENOUS | Status: AC
  Filled 2022-01-16: qty 100

## 2022-01-16 MED ORDER — PROPOFOL 10 MG/ML IV BOLUS
INTRAVENOUS | Status: DC | PRN
  Administered 2022-01-16: 80 mg via INTRAVENOUS

## 2022-01-16 MED ORDER — LIDOCAINE HCL (CARDIAC) PF 100 MG/5ML IV SOSY
PREFILLED_SYRINGE | INTRAVENOUS | Status: DC | PRN
  Administered 2022-01-16: 50 mg via INTRAVENOUS

## 2022-01-16 MED ORDER — LIDOCAINE HCL (PF) 2 % IJ SOLN
INTRAMUSCULAR | Status: AC
  Filled 2022-01-16: qty 5

## 2022-01-16 MED ORDER — GLYCOPYRROLATE 0.2 MG/ML IJ SOLN
INTRAMUSCULAR | Status: AC
  Filled 2022-01-16: qty 1

## 2022-01-16 NOTE — Anesthesia Procedure Notes (Signed)
Date/Time: 01/16/2022 7:51 AM  Performed by: Johnna Acosta, CRNAPre-anesthesia Checklist: Patient identified, Emergency Drugs available, Suction available, Patient being monitored and Timeout performed Patient Re-evaluated:Patient Re-evaluated prior to induction Oxygen Delivery Method: Nasal cannula Preoxygenation: Pre-oxygenation with 100% oxygen Induction Type: IV induction

## 2022-01-16 NOTE — Transfer of Care (Signed)
Immediate Anesthesia Transfer of Care Note  Patient: Rebecca Kramer  Procedure(s) Performed: COLONOSCOPY WITH PROPOFOL ESOPHAGOGASTRODUODENOSCOPY (EGD) WITH PROPOFOL  Patient Location: PACU  Anesthesia Type:General  Level of Consciousness: awake and alert   Airway & Oxygen Therapy: Patient Spontanous Breathing  Post-op Assessment: Report given to RN and Post -op Vital signs reviewed and stable  Post vital signs: Reviewed and stable  Last Vitals:  Vitals Value Taken Time  BP 105/70 01/16/22 0827  Temp    Pulse 77 01/16/22 0827  Resp 20 01/16/22 0827  SpO2 100 % 01/16/22 0827    Last Pain:  Vitals:   01/16/22 0718  TempSrc: Temporal  PainSc: 0-No pain         Complications: No notable events documented.

## 2022-01-16 NOTE — Anesthesia Postprocedure Evaluation (Signed)
Anesthesia Post Note  Patient: BAYLEIGH LOFLIN  Procedure(s) Performed: COLONOSCOPY WITH PROPOFOL ESOPHAGOGASTRODUODENOSCOPY (EGD) WITH PROPOFOL  Patient location during evaluation: PACU Anesthesia Type: General Level of consciousness: awake and alert, oriented and patient cooperative Pain management: pain level controlled Vital Signs Assessment: post-procedure vital signs reviewed and stable Respiratory status: spontaneous breathing, nonlabored ventilation and respiratory function stable Cardiovascular status: blood pressure returned to baseline and stable Postop Assessment: adequate PO intake Anesthetic complications: no   No notable events documented.   Last Vitals:  Vitals:   01/16/22 0847 01/16/22 0850  BP:    Pulse: 65 68  Resp: 13 18  Temp:    SpO2: 100% 100%    Last Pain:  Vitals:   01/16/22 0827  TempSrc: Temporal  PainSc:                  Darrin Nipper

## 2022-01-16 NOTE — Op Note (Signed)
[facility] Gastroenterology Patient Name: Rebecca Kramer Procedure Date: 01/16/2022 7:29 AM MRN: [id number] Account #: [id number] Date of Birth: 1946-02-18 Admit Type: Outpatient Age: 76 Room: [facility] ENDO ROOM 3 Gender: Female Note Status: Finalized Instrument Name: Michaelle Birks [id number] Procedure:             Upper GI endoscopy Indications:           Regurgitation Providers:             Andrey Farmer MD, MD Medicines:             Monitored Anesthesia Care Complications:         No immediate complications. Estimated blood loss:                         Minimal. Procedure:             Pre-Anesthesia Assessment:                        - Prior to the procedure, a History and Physical was                         performed, and patient medications and allergies were                         reviewed. The patient is competent. The risks and                         benefits of the procedure and the sedation options and                         risks were discussed with the patient. All questions                         were answered and informed consent was obtained.                         Patient identification and proposed procedure were                         verified by the physician, the nurse, the                         anesthesiologist, the anesthetist and the technician                         in the endoscopy suite. Mental Status Examination:                         alert and oriented. Airway Examination: normal                         oropharyngeal airway and neck mobility. Respiratory                         Examination: clear to auscultation. CV Examination:                         normal. Prophylactic Antibiotics: The patient does not  require prophylactic antibiotics. Prior                         Anticoagulants: The patient has taken no previous                         anticoagulant or antiplatelet agents. ASA Grade                          Assessment: II - A patient with mild systemic disease.                         After reviewing the risks and benefits, the patient                         was deemed in satisfactory condition to undergo the                         procedure. The anesthesia plan was to use monitored                         anesthesia care (MAC). Immediately prior to                         administration of medications, the patient was                         re-assessed for adequacy to receive sedatives. The                         heart rate, respiratory rate, oxygen saturations,                         blood pressure, adequacy of pulmonary ventilation, and                         response to care were monitored throughout the                         procedure. The physical status of the patient was                         re-assessed after the procedure.                        After obtaining informed consent, the endoscope was                         passed under direct vision. Throughout the procedure,                         the patient's blood pressure, pulse, and oxygen                         saturations were monitored continuously. The Endoscope                         was introduced through the mouth, and advanced to the  second part of duodenum. The upper GI endoscopy was                         accomplished without difficulty. The patient tolerated                         the procedure well. Findings:      LA Grade C (one or more mucosal breaks continuous between tops of 2 or       more mucosal folds, less than 75% circumference) esophagitis with no       bleeding was found. Biopsies were taken with a cold forceps for       histology. Estimated blood loss was minimal.      A 4 cm hiatal hernia was found. The proximal extent of the gastric folds       (end of tubular esophagus) was 35 cm from the incisors. The hiatal       narrowing was 39 cm from the  incisors. The Z-line was a variable       distance from incisors; the hiatal hernia was sliding.      A few small sessile fundic gland polyps were found in the gastric fundus       and in the gastric body.      The exam of the stomach was otherwise normal.      The examined duodenum was normal. Impression:            - LA Grade C reflux esophagitis with no bleeding.                         Biopsied.                        - 4 cm hiatal hernia.                        - A few fundic gland polyps.                        - Normal examined duodenum. Recommendation:        - Await pathology results.                        - Use a proton pump inhibitor PO BID for 3 months.                        - Repeat upper endoscopy in 3 months to check healing.                        - Perform a colonoscopy today. Procedure Code(s):     --- Professional ---                        [phone], Esophagogastroduodenoscopy, flexible,                         transoral; with biopsy, single or multiple Diagnosis Code(s):     --- Professional ---                        K21.00, Gastro-esophageal reflux disease with  esophagitis, without bleeding                        K44.9, Diaphragmatic hernia without obstruction or                         gangrene                        K31.7, Polyp of stomach and duodenum                        R11.10, Vomiting, unspecified CPT copyright 2019 American Medical Association. All rights reserved. The codes documented in this report are preliminary and upon coder review may  be revised to meet current compliance requirements. Andrey Farmer MD, MD 01/16/2022 8:31:14 AM Number of Addenda: 0 Note Initiated On: 01/16/2022 7:29 AM Estimated Blood Loss:  Estimated blood loss was minimal.      [facility]

## 2022-01-16 NOTE — Anesthesia Preprocedure Evaluation (Addendum)
Anesthesia Evaluation  Patient identified by MRN, date of birth, ID band Patient awake    Reviewed: Allergy & Precautions, NPO status , Patient's Chart, lab work & pertinent test results  History of Anesthesia Complications Negative for: history of anesthetic complications  Airway Mallampati: I   Neck ROM: Full    Dental   Crowns :   Pulmonary sleep apnea and Continuous Positive Airway Pressure Ventilation ,    Pulmonary exam normal breath sounds clear to auscultation       Cardiovascular hypertension, Normal cardiovascular exam Rhythm:Regular Rate:Normal     Neuro/Psych negative neurological ROS     GI/Hepatic PUD, GERD (Barrett esophagus)  ,Paraesophageal hernia   Endo/Other  diabetes, Type 2Hypothyroidism   Renal/GU negative Renal ROS     Musculoskeletal   Abdominal   Peds  Hematology negative hematology ROS (+)   Anesthesia Other Findings   Reproductive/Obstetrics                            Anesthesia Physical Anesthesia Plan  ASA: 2  Anesthesia Plan: General   Post-op Pain Management:    Induction: Intravenous  PONV Risk Score and Plan: 3 and Propofol infusion, TIVA and Treatment may vary due to age or medical condition  Airway Management Planned: Natural Airway  Additional Equipment:   Intra-op Plan:   Post-operative Plan:   Informed Consent: I have reviewed the patients History and Physical, chart, labs and discussed the procedure including the risks, benefits and alternatives for the proposed anesthesia with the patient or authorized representative who has indicated his/her understanding and acceptance.       Plan Discussed with: CRNA  Anesthesia Plan Comments: (LMA/GETA backup discussed.  Patient consented for risks of anesthesia including but not limited to:  - adverse reactions to medications - damage to eyes, teeth, lips or other oral mucosa - nerve  damage due to positioning  - sore throat or hoarseness - damage to heart, brain, nerves, lungs, other parts of body or loss of life  Informed patient about role of CRNA in peri- and intra-operative care.  Patient voiced understanding.)        Anesthesia Quick Evaluation

## 2022-01-16 NOTE — Interval H&P Note (Signed)
History and Physical Interval Note:  01/16/2022 7:48 AM  Rebecca Kramer  has presented today for surgery, with the diagnosis of GERD,HX of adenomatous polyp of colon.  The various methods of treatment have been discussed with the patient and family. After consideration of risks, benefits and other options for treatment, the patient has consented to  Procedure(s) with comments: COLONOSCOPY WITH PROPOFOL (N/A) - DM ESOPHAGOGASTRODUODENOSCOPY (EGD) WITH PROPOFOL (N/A) as a surgical intervention.  The patient's history has been reviewed, patient examined, no change in status, stable for surgery.  I have reviewed the patient's chart and labs.  Questions were answered to the patient's satisfaction.     [name]  Ok to proceed with EGD/Colonoscopy

## 2022-01-16 NOTE — Op Note (Signed)
[facility] Gastroenterology Patient Name: Rebecca Kramer Procedure Date: 01/16/2022 7:29 AM MRN: [id number] Account #: [id number] Date of Birth: 14-May-1946 Admit Type: Outpatient Age: 76 Room: [facility] ENDO ROOM 3 Gender: Female Note Status: Finalized Instrument Name: Park Meo [id number] Procedure:             Colonoscopy Indications:           Surveillance: Personal history of adenomatous polyps                         on last colonoscopy 5 years ago Providers:             Andrey Farmer MD, MD Medicines:             Monitored Anesthesia Care Complications:         No immediate complications. Estimated blood loss:                         Minimal. Procedure:             Pre-Anesthesia Assessment:                        - Prior to the procedure, a History and Physical was                         performed, and patient medications and allergies were                         reviewed. The patient is competent. The risks and                         benefits of the procedure and the sedation options and                         risks were discussed with the patient. All questions                         were answered and informed consent was obtained.                         Patient identification and proposed procedure were                         verified by the physician, the nurse, the                         anesthesiologist, the anesthetist and the technician                         in the endoscopy suite. Mental Status Examination:                         alert and oriented. Airway Examination: normal                         oropharyngeal airway and neck mobility. Respiratory                         Examination: clear to auscultation. CV Examination:  normal. Prophylactic Antibiotics: The patient does not                         require prophylactic antibiotics. Prior                         Anticoagulants: The patient has taken no previous                          anticoagulant or antiplatelet agents. ASA Grade                         Assessment: II - A patient with mild systemic disease.                         After reviewing the risks and benefits, the patient                         was deemed in satisfactory condition to undergo the                         procedure. The anesthesia plan was to use monitored                         anesthesia care (MAC). Immediately prior to                         administration of medications, the patient was                         re-assessed for adequacy to receive sedatives. The                         heart rate, respiratory rate, oxygen saturations,                         blood pressure, adequacy of pulmonary ventilation, and                         response to care were monitored throughout the                         procedure. The physical status of the patient was                         re-assessed after the procedure.                        After obtaining informed consent, the colonoscope was                         passed under direct vision. Throughout the procedure,                         the patient's blood pressure, pulse, and oxygen                         saturations were monitored continuously. The  Colonoscope was introduced through the anus and                         advanced to the the cecum, identified by appendiceal                         orifice and ileocecal valve. The colonoscopy was                         somewhat difficult due to an endoscope malfunction.                         Successful completion of the procedure was aided by                         Malfunction was maintaining adequate insufflation.                         Views were adequate. The patient tolerated the                         procedure well. The quality of the bowel preparation                         was good. Findings:      The perianal and digital rectal  examinations were normal.      A 5 mm polyp was found in the transverse colon. The polyp was sessile.       The polyp was removed with a cold snare. Resection and retrieval were       complete. Estimated blood loss was minimal.      The exam was otherwise without abnormality on direct and retroflexion       views. Impression:            - One 5 mm polyp in the transverse colon, removed with                         a cold snare. Resected and retrieved.                        - The examination was otherwise normal on direct and                         retroflexion views. Recommendation:        - Discharge patient to home.                        - Resume previous diet.                        - Continue present medications.                        - Await pathology results.                        - Repeat colonoscopy is not recommended due to current                         age (18  years or older) for surveillance.                        - Return to referring physician as previously                         scheduled. Procedure Code(s):     --- Professional ---                        [phone], Colonoscopy, flexible; with removal of                         tumor(s), polyp(s), or other lesion(s) by snare                         technique Diagnosis Code(s):     --- Professional ---                        Z86.010, Personal history of colonic polyps                        K63.5, Polyp of colon CPT copyright 2019 American Medical Association. All rights reserved. The codes documented in this report are preliminary and upon coder review may  be revised to meet current compliance requirements. Andrey Farmer MD, MD 01/16/2022 8:35:50 AM Number of Addenda: 0 Note Initiated On: 01/16/2022 7:29 AM Scope Withdrawal Time: 0 hours 9 minutes 18 seconds  Total Procedure Duration: 0 hours 17 minutes 6 seconds  Estimated Blood Loss:  Estimated blood loss was minimal.      [facility]

## 2022-01-16 NOTE — H&P (Signed)
Outpatient short stay form Pre-procedure 01/16/2022  [name], MD  Primary Physician: Idelle Crouch, MD  Reason for visit:  Regurgitation/Surveillance colonoscopy  History of present illness:    76 y/o lady with hypothyroidism, hypertension, and known hiatal hernia here for EGD for regurgitation and colonoscopy for history of adenomatous polyp on last colonoscopy 5 years ago. No blood thinners. No family history of GI malignancies. History of partial hysterectomy.    Current Facility-Administered Medications:    0.9 %  sodium chloride infusion, , Intravenous, Continuous, Dijon Kohlman, Hilton Cork, MD, Last Rate: 20 mL/hr at 01/16/22 0738, New Bag at 01/16/22 0738  Medications Prior to Admission  Medication Sig Dispense Refill Last Dose   levothyroxine (SYNTHROID) 100 MCG tablet Take 1 tablet (100 mcg total) by mouth daily before breakfast. 90 tablet 3 01/16/2022 at 0600   losartan (COZAAR) 50 MG tablet Take 1 tablet (50 mg total) by mouth daily. (Patient taking differently: Take 50 mg by mouth 2 times daily at 12 noon and 4 pm.) 90 tablet 1 01/16/2022 at 0630   sucralfate (CARAFATE) 1 GM/10ML suspension Take 1 g by mouth 4 (four) times daily -  with meals and at bedtime.      amLODipine (NORVASC) 5 MG tablet       calcium-vitamin D (OSCAL WITH D) 500-200 MG-UNIT tablet Take 1 tablet by mouth.      dapagliflozin propanediol (FARXIGA) 5 MG TABS tablet Take 1 tablet (5 mg total) by mouth daily before breakfast. 90 tablet 3    metFORMIN (GLUCOPHAGE-XR) 500 MG 24 hr tablet TAKE 4 TABLETS DAILY WITH SUPPER 360 tablet 3    Multiple Vitamin (MULTIVITAMIN) tablet Take 1 tablet by mouth daily.      pantoprazole (PROTONIX) 40 MG tablet TAKE 1 TABLET ONCE DAILY.  TAKE 30 TO 45 MINUTES BEFORE BREAKFAST        Allergies  Allergen Reactions   Benzonatate     Hot flash   Dilaudid [Hydromorphone Hcl] Other (See Comments)    Face turned red; bp     Past Medical History:  Diagnosis Date    Arthritis    Barrett's esophagus    Bone spur    Left foot / posterior   Diabetes mellitus without complication (HCC)    Diverticulosis    GERD (gastroesophageal reflux disease)    esophagitis with ulceration   Hypertension    Hypothyroidism    Menopausal symptoms    hot flashes   Osteopenia    Pure hypercholesterolemia    Sleep apnea 2007   AHI 45/hr in 2007    Review of systems:  Otherwise negative.    Physical Exam  Gen: Alert, oriented. Appears stated age.  HEENT: PERRLA. Lungs: No respiratory distress CV: RRR Abd: soft, benign, no masses Ext: No edema    Planned procedures: Proceed with EGD/colonoscopy. The patient understands the nature of the planned procedure, indications, risks, alternatives and potential complications including but not limited to bleeding, infection, perforation, damage to internal organs and possible oversedation/side effects from anesthesia. The patient agrees and gives consent to proceed.  Please refer to procedure notes for findings, recommendations and patient disposition/instructions.     [name], MD [facility] Gastroenterology

## 2022-01-19 ENCOUNTER — Encounter: Payer: Self-pay | Admitting: Gastroenterology

## 2022-01-20 LAB — SURGICAL PATHOLOGY

## 2022-01-21 ENCOUNTER — Ambulatory Visit: Payer: BC Managed Care – PPO | Admitting: Surgery

## 2022-03-03 ENCOUNTER — Ambulatory Visit: Payer: BC Managed Care – PPO | Admitting: Internal Medicine

## 2022-03-03 ENCOUNTER — Encounter: Payer: Self-pay | Admitting: Internal Medicine

## 2022-03-03 VITALS — BP 128/80 | HR 81 | Ht 65.0 in | Wt 185.4 lb

## 2022-03-03 DIAGNOSIS — E119 Type 2 diabetes mellitus without complications: Secondary | ICD-10-CM

## 2022-03-03 DIAGNOSIS — E063 Autoimmune thyroiditis: Secondary | ICD-10-CM | POA: Diagnosis not present

## 2022-03-03 DIAGNOSIS — E038 Other specified hypothyroidism: Secondary | ICD-10-CM | POA: Diagnosis not present

## 2022-03-03 DIAGNOSIS — E669 Obesity, unspecified: Secondary | ICD-10-CM

## 2022-03-03 LAB — POCT GLYCOSYLATED HEMOGLOBIN (HGB A1C): Hemoglobin A1C: 6.6 % — AB (ref 4.0–5.6)

## 2022-03-03 NOTE — Progress Notes (Signed)
Patient ID: Rebecca Kramer, female   DOB: 1946-04-28, 76 y.o.   MRN: [id number]   HPI  Rebecca Kramer is a 76 y.o.-year-old female, initially referred by her PCP, Dr.Tower, presenting for f/u for Hashimoto hypothyroidism and DM2, non-insulin-dependent, without long-term complications. She was seeing Dr Sharol Roussel [facility]) >> stopped seeing her 2/2 cost.  Last visit 6 months ago.  Interim history: She would like to lose approximately 30 pounds, as advised by her pulmonologist to improve her sleep apnea.  She lost 6 pounds since last visit. No increased urination, blurry vision, nausea, chest pain. She has acid reflux and hiatal hernia >> she has more regurgitation. She had an Es manometry at [facility]. She may have Sx. Her Protonix was increased to twice a day.  She just returned from vacation.  Hypothyroidism: - dx'ed "many years ago"  She tried: -Generic levothyroxine -Brand names -Then, Naturethroid 81.5 mg + Cytomel 5 mg bid (equivalent to ~180 mcg LT4)  -then, generic levothyroxine -Changed to brand name Synthroid 11/2019 >> but generic LT4 since 08/2020 per insurance preference.  She takes LT4 100 mcg daily: - in am - fasting - at least 30 min from b'fast - + calcium within >4 hours from levothyroxine - no iron - + multivitamins >4 hours of levothyroxine - + PPIs with lunch >> now 2x a day  - first dose is 1h after LT4! - not on Biotin (large dose: 1500 mcg)  Reviewed TFTs: 11/24/2021: TSH 0.861 05/27/2021: TSH 2.251 11/20/2020: TSH 1.615 06/03/2020: TSH 1.554 08/18/2019: TSH 0.951 11/15/2018: 1.086 Lab Results  Component Value Date   TSH 1.68 07/21/2018   TSH 1.24 08/19/2017   TSH 1.52 04/28/2016   TSH 2.02 01/16/2016   TSH 1.97 12/05/2015   TSH 1.27 11/19/2015   TSH 0.43 01/10/2015   TSH 1.12 11/26/2014   TSH 0.79 07/23/2014   TSH 1.04 05/08/2014   FREET4 1.02 07/21/2018   FREET4 0.99 08/19/2017   FREET4 0.88 04/28/2016   FREET4 0.86  01/16/2016   FREET4 1.02 11/19/2015   FREET4 1.33 01/10/2015   FREET4 1.23 07/23/2014   FREET4 1.02 05/08/2014   FREET4 1.4 10/08/2008  05/05/2017: TSH 1.233 total T4 9.4 11/02/2016: TSH 0.435, total T4 11.6  03/14/2014: ATA 313 (<2) TPO Abs 39 (<9) TSH 3.552 (0.35-4.5), free T4 0.82 (0.8-1.8), Free T3 2.6 (2.3-4.2), reverse T3 12 (8-25) At the same time, she hadn't hemoglobin A1c 6.3, fasting insulin 16.6, vitamin D 66  Pt denies: - feeling nodules in neck - hoarseness - dysphagia - choking  No FH of thyroid disease or thyroid cancer. No h/o radiation tx to head or neck. No herbal supplements. No Biotin use. No recent steroids use.   She was exercising at [employer] but not during the coronavirus pandemic Husband has prostate cancer.  DM2: -Diagnosed in 2016  Reviewed HbA1c levels: Lab Results  Component Value Date   HGBA1C 6.7 (A) 09/02/2021   HGBA1C 6.1 (A) 03/03/2021   HGBA1C 6.8 (A) 09/03/2020   HGBA1C 6.3 (A) 04/30/2020   HGBA1C 6.7 (A) 07/25/2019   HGBA1C 6.4 (A) 03/28/2019   HGBA1C 6.5 08/28/2016   HGBA1C 6.4 12/05/2015   HGBA1C 6.6 (H) 08/27/2015   HGBA1C 6.4 06/04/2015  11/15/2018: HbA1c 6.8% 05/17/2018: HbA1c 6.9%  She is on: - Metformin ER 500 mg with lunch or dinner >> 1000 mg with dinner >> 2000 mg with dinner (increase 09/2020) - Farxiga 5 mg before breakfast  She checks sugars once a day: -  am:135-181 >> 106-148, 184 >> 113-145, 151, 161 >> 125-178 - 2h after b'fast: n/c >> 131 >> n/c - lunch: 99-110 >> 98-101 >> 138-184 >> 120, 159 >> n/c >> 128 - 2h after lunch: 113 >> n/c >> 175-185 >> n/c >> 79, 123 >> n/c - dinner: n/c >> 99-115 >> 115-112 >> 115>> n/c - 2h after dinner: 163, 166, 247 >> n/c >> 192, 240 >> n/c >> 134 >> n/c - bedtime: 104 >> n/c Lowest: 99 >> 90s >> 135 >> 106 >> 79 >> 125 Highest: 170s >> 240 >> 184 >> 161 >> 178  She and her husband started walking after the holidays-walking 30 to 40 minutes a day. Continues  walking.  No CKD: 11/24/2021: Glucose 70 - 110 mg/dL 103   Sodium 136 - 145 mmol/L 137   Potassium 3.6 - 5.1 mmol/L 4.5   Chloride 97 - 109 mmol/L 102   Carbon Dioxide (CO2) 22.0 - 32.0 mmol/L 27.8   Urea Nitrogen (BUN) 7 - 25 mg/dL 11   Creatinine 0.6 - 1.1 mg/dL 0.8   Glomerular Filtration Rate (eGFR), MDRD Estimate >60 mL/min/1.73sq m 70   Calcium 8.7 - 10.3 mg/dL 9.7   AST  8 - 39 U/L 18   ALT  5 - 38 U/L 16   Alk Phos (alkaline Phosphatase) 34 - 104 U/L 51   Albumin 3.5 - 4.8 g/dL 4.5   Bilirubin, Total 0.3 - 1.2 mg/dL 0.4   Protein, Total 6.1 - 7.9 g/dL 6.9   A/G Ratio 1.0 - 5.0 gm/dL 1.9   05/27/2021: 16/0.8, GFR 70, glucose 112 11/20/2020 ([facility]): Glu 109, BUN/Cr 15/0.9, GFR 61 06/03/2020: Glu 107, BUN/Cr 15/0.8 08/18/2019: Glucose 173, BUN/Cr 14/0.9 11/15/2018: Glucose 122, BUN/Cr 18/0.8 Lab Results  Component Value Date   BUN 20 08/28/2016   BUN 16 12/05/2015   Lab Results  Component Value Date   CREATININE 1.00 11/03/2021   CREATININE 0.83 08/28/2016  On losartan.  + HL: 11/24/2021:  Cholesterol, Total 100 - 200 mg/dL 214 High    Triglyceride 35 - 199 mg/dL 155   HDL (High Density Lipoprotein) Cholesterol 35.0 - 85.0 mg/dL 54.2   LDL Calculated 0 - 130 mg/dL 129   VLDL Cholesterol mg/dL 31   Cholesterol/HDL Ratio  3.9   05/27/2021: 195/152/45.5/119 11/20/2020: 202/123/52.3/125 06/03/2020: 206/127/52.7/128 05/11/2019: 188/248/42.7/96 11/15/2018: 185/144/46.5/106 Lab Results  Component Value Date   CHOL 195 08/28/2016   HDL 51.30 08/28/2016   LDLCALC 118 (H) 08/28/2016   LDLDIRECT 129.6 07/17/2011   TRIG 130.0 08/28/2016   CHOLHDL 4 08/28/2016  Not on a statin.  Last eye exam: 10/06/2021: No DR.  She had cataracts >> had surgery 05/2020.    She has sleep apnea - has a CPAP - does not like the machine.  ROS: + see HPI  I reviewed pt's medications, allergies, PMH, social hx, family hx, and changes were documented in the history of present illness.  Otherwise, unchanged from my initial visit note.  Past Medical History:  Diagnosis Date   Arthritis    Barrett's esophagus    Bone spur    Left foot / posterior   Diabetes mellitus without complication (HCC)    Diverticulosis    GERD (gastroesophageal reflux disease)    esophagitis with ulceration   Hypertension    Hypothyroidism    Menopausal symptoms    hot flashes   Osteopenia    Pure hypercholesterolemia    Sleep apnea 2007   AHI 45/hr in 2007  Past Surgical History:  Procedure Laterality Date   ABDOMINAL HYSTERECTOMY  1983   partial/endometriosis   BREAST EXCISIONAL BIOPSY Right    BREAST SURGERY  1970's   breast lumpectomy/benign   BREAST SURGERY  1994   breast implants/saline   CATARACT EXTRACTION     COLONOSCOPY WITH PROPOFOL N/A 04/20/2017   Procedure: COLONOSCOPY WITH PROPOFOL;  Surgeon: Lollie Sails, MD;  Location: [facility] ENDOSCOPY;  Service: Endoscopy;  Laterality: N/A;   COLONOSCOPY WITH PROPOFOL N/A 06/17/2017   Procedure: COLONOSCOPY WITH PROPOFOL;  Surgeon: Lollie Sails, MD;  Location: [facility] ENDOSCOPY;  Service: Endoscopy;  Laterality: N/A;   COLONOSCOPY WITH PROPOFOL N/A 01/16/2022   Procedure: COLONOSCOPY WITH PROPOFOL;  Surgeon: [name], MD;  Location: [facility] ENDOSCOPY;  Service: Endoscopy;  Laterality: N/A;  DM   DORSAL COMPARTMENT RELEASE  05/14/2011   Procedure: RELEASE DORSAL COMPARTMENT (DEQUERVAIN);  Surgeon: Cammie Sickle., MD;  Location: [facility];  Service: Orthopedics;  Laterality: Right;  right release dorsal compartment/ de quervain release with debridement of mucoid cyst   ESOPHAGOGASTRODUODENOSCOPY  03/2008   esophagitis with ulcerations/?barretts   ESOPHAGOGASTRODUODENOSCOPY (EGD) WITH PROPOFOL N/A 04/20/2017   Procedure: ESOPHAGOGASTRODUODENOSCOPY (EGD) WITH PROPOFOL;  Surgeon: Lollie Sails, MD;  Location: [facility] ENDOSCOPY;  Service: Endoscopy;  Laterality: N/A;   ESOPHAGOGASTRODUODENOSCOPY (EGD)  WITH PROPOFOL  06/17/2017   Procedure: ESOPHAGOGASTRODUODENOSCOPY (EGD) WITH PROPOFOL;  Surgeon: Lollie Sails, MD;  Location: [facility] ENDOSCOPY;  Service: Endoscopy;;   ESOPHAGOGASTRODUODENOSCOPY (EGD) WITH PROPOFOL N/A 01/16/2022   Procedure: ESOPHAGOGASTRODUODENOSCOPY (EGD) WITH PROPOFOL;  Surgeon: [name], MD;  Location: [facility] ENDOSCOPY;  Service: Endoscopy;  Laterality: N/A;   thumb nodule     removed   TONSILLECTOMY     History   Social History   Marital Status: Married    Spouse Name: N/A    Number of Children: 2   Occupational History   retired   Social History Main Topics   Smoking status: Never Smoker    Smokeless tobacco: Never Used   Alcohol Use: No   Drug Use: No   Current Outpatient Medications on File Prior to Visit  Medication Sig Dispense Refill   amLODipine (NORVASC) 5 MG tablet      calcium-vitamin D (OSCAL WITH D) 500-200 MG-UNIT tablet Take 1 tablet by mouth.     dapagliflozin propanediol (FARXIGA) 5 MG TABS tablet Take 1 tablet (5 mg total) by mouth daily before breakfast. 90 tablet 3   levothyroxine (SYNTHROID) 100 MCG tablet Take 1 tablet (100 mcg total) by mouth daily before breakfast. 90 tablet 3   losartan (COZAAR) 50 MG tablet Take 1 tablet (50 mg total) by mouth daily. (Patient taking differently: Take 50 mg by mouth 2 times daily at 12 noon and 4 pm.) 90 tablet 1   metFORMIN (GLUCOPHAGE-XR) 500 MG 24 hr tablet TAKE 4 TABLETS DAILY WITH SUPPER 360 tablet 3   Multiple Vitamin (MULTIVITAMIN) tablet Take 1 tablet by mouth daily.     pantoprazole (PROTONIX) 40 MG tablet TAKE 1 TABLET ONCE DAILY.  TAKE 30 TO 45 MINUTES BEFORE BREAKFAST     sucralfate (CARAFATE) 1 GM/10ML suspension Take 1 g by mouth 4 (four) times daily -  with meals and at bedtime.     No current facility-administered medications on file prior to visit.   Allergies  Allergen Reactions   Benzonatate     Hot flash   Dilaudid [Hydromorphone Hcl] Other (See Comments)    Face  turned red; bp   Family History  Problem Relation Age of Onset   Hypertension Mother    Hyperlipidemia Mother    Colon polyps Mother    Heart disease Mother    Migraines Mother    Cancer Father        lung CA   Migraines Brother    Cancer Maternal Aunt        breast CA   Breast cancer Maternal Aunt    PE: BP 128/80 (BP Location: Right Arm, Patient Position: Sitting, Cuff Size: Normal)   Pulse 81   Ht 5' 5"  (1.651 m)   Wt 185 lb 6.4 oz (84.1 kg)   SpO2 95%   BMI 30.85 kg/m  Wt Readings from Last 3 Encounters:  03/03/22 185 lb 6.4 oz (84.1 kg)  01/16/22 180 lb (81.6 kg)  10/13/21 186 lb (84.4 kg)   Constitutional: overweight, in NAD Eyes: no exophthalmos ENT: moist mucous membranes, no masses palpated in neck, no cervical lymphadenopathy Cardiovascular: RRR, No MRG Respiratory: CTA B Musculoskeletal: no deformities Skin: moist, warm, no rashes Neurological: no tremor with outstretched hands  ASSESSMENT: 1. Hypothyroidism - Due to Hashimoto's thyroiditis  2. DM2, non-insulin-dependent  3.  Obesity class I  PLAN:  1. Patient with longstanding Hashimoto's hypothyroidism, on LT4 therapy  - she was previously on Synthroid but this was not covered by insurance so we switched from this to levothyroxine generic.  She tolerates this well. - latest thyroid labs reviewed with pt. >> normal in 11/2021: 0.861 - she continues on LT4 100 mcg daily - pt feels good on this dose. - we discussed about taking the thyroid hormone every day, with water, >30 minutes before breakfast, separated by >4 hours from acid reflux medications, calcium, iron, multivitamins. Pt. is taking it correctly except for the fact that she started Protonix in the morning and she only takes this 1 hour after levothyroxine.  She does not feel that she could will be later in the day.  Therefore, I advised her to try to chew the tablets to hopefully get full absorption before she takes the Protonix.  After this,  plan to check a TSH and a free T4 in 1.5 months.  I did advise her to let me know if anything changes in how she takes the Protonix.  2.  DM2, non-insulin-dependent -Well-controlled on oral regimen with metformin and SGLT2 inhibitor only -At last visit, HbA1c was higher, but still at goal, at 6.7%. -At that time, sugars are mostly at goal with only occasional mild hyperglycemic spikes in the morning.  She was not usually checking later in the day.  I did advise her to start rotating the check times.  At that time, she was off [place] to reduce her pill burden but I recommended to restart it due to many benefits including weight loss, in which she was very interested.  I did advise her about how to take this correctly, the relationship with levothyroxine. -At today's visit, sugars are at or above target in the morning.  In the last few weeks, they have been slightly higher, up to 170s in the morning.  Since sugars are usually at goal later in the day (I did advise her to check more frequently), I will advise her to move [place] before dinner to hopefully improve the morning sugars. -She is very reticent to add any other medication for now.  We did discuss about possibly adding a GLP-1 receptor agonist, however, I am reticent to  do this until her investigation and possible esophageal surgery is coming 8 8. - I advised her to: Patient Instructions  Please continue: - Metformin 2000 mg with dinner - Farxiga 5 mg daily - but move it before dinner  Please continue Synthroid 100 mcg daily - Chew the tablet.  Take the thyroid hormone every day, with water, at least 30 minutes before breakfast, separated by at least 4 hours from: - acid reflux medications - calcium - iron - multivitamins  Please come back for a follow-up appointment in 4-6 months.  - we checked her HbA1c: 6.6% (lower) - advised to check sugars at different times of the day - 1x a day, rotating check times - advised for yearly eye  exams >> she is UTD - discussed about adding a statin but she is very reticent to do this for now.  We decided to discussed about this again after her Protonix dose is reduced to only once a day or even stopped, as the pill burden is very important for her. - return to clinic in 4-6 months  3.Obesity class I -We did discuss in the past about stopping snacking after dinner -She would like to lose 30 pounds to hopefully improve her sleep apnea -will continue Farxiga, which should also help with weight loss -I also recommended a weight management clinic at [facility], but did she did not pursue this -she lost 1 lbs since last OV  Orders Placed This Encounter  Procedures   TSH   T4, free   POCT glycosylated hemoglobin (Hb A1C)   Philemon Kingdom, MD PhD [facility]

## 2022-03-03 NOTE — Patient Instructions (Addendum)
Please continue: - Metformin 2000 mg with dinner - Farxiga 5 mg daily - but move it before dinner  Please continue Synthroid 100 mcg daily - Chew the tablet.  Take the thyroid hormone every day, with water, at least 30 minutes before breakfast, separated by at least 4 hours from: - acid reflux medications - calcium - iron - multivitamins  Please come back for a follow-up appointment in 4-6 months.

## 2022-03-04 ENCOUNTER — Other Ambulatory Visit: Payer: Self-pay

## 2022-03-04 ENCOUNTER — Telehealth: Payer: Self-pay

## 2022-03-04 DIAGNOSIS — E119 Type 2 diabetes mellitus without complications: Secondary | ICD-10-CM

## 2022-03-04 MED ORDER — DAPAGLIFLOZIN PROPANEDIOL 5 MG PO TABS: 5.0000 mg | ORAL_TABLET | Freq: Every day | ORAL | 0 refills | Status: DC

## 2022-03-04 NOTE — Telephone Encounter (Signed)
Pt called to advise she will run out of her Farxiga prescription before her mail order pharmacy will send another one. Pt is working with her insurance to be able to afford a bridge rx but it will cost her over $100 and she can not afford that. She is wondering if she can not figure something out with her insurance will it be ok for her to go without her medication for about a week.

## 2022-03-04 NOTE — Telephone Encounter (Signed)
Can we give her a sample of Jardiance for 1-2 weeks? Jardiance 10 should be fine (unless we have [place] 5).

## 2022-03-04 NOTE — Telephone Encounter (Signed)
Pt called back and she confirmed insurance will cover a bridge rx. Rx was sent to her local pharmacy.

## 2022-04-14 ENCOUNTER — Other Ambulatory Visit (INDEPENDENT_AMBULATORY_CARE_PROVIDER_SITE_OTHER): Payer: BC Managed Care – PPO

## 2022-04-14 DIAGNOSIS — E063 Autoimmune thyroiditis: Secondary | ICD-10-CM | POA: Diagnosis not present

## 2022-04-14 DIAGNOSIS — E038 Other specified hypothyroidism: Secondary | ICD-10-CM | POA: Diagnosis not present

## 2022-04-14 LAB — TSH: TSH: 1.37 u[IU]/mL (ref 0.35–5.50)

## 2022-04-14 LAB — T4, FREE: Free T4: 1.13 ng/dL (ref 0.60–1.60)

## 2022-04-23 ENCOUNTER — Encounter: Payer: Self-pay | Admitting: *Deleted

## 2022-04-24 ENCOUNTER — Encounter
Admission: RE | Disposition: A | Discharge status: Home / Self Care | Payer: Self-pay | Source: Home / Self Care | Attending: Gastroenterology

## 2022-04-24 ENCOUNTER — Ambulatory Visit: Payer: BC Managed Care – PPO | Admitting: Certified Registered Nurse Anesthetist

## 2022-04-24 ENCOUNTER — Encounter: Payer: Self-pay | Admitting: *Deleted

## 2022-04-24 ENCOUNTER — Ambulatory Visit
Admission: RE | Admit: 2022-04-24 | Discharge: 2022-04-24 | Disposition: A | Discharge status: Home / Self Care | Payer: BC Managed Care – PPO | Attending: Gastroenterology | Admitting: Gastroenterology

## 2022-04-24 DIAGNOSIS — E039 Hypothyroidism, unspecified: Secondary | ICD-10-CM | POA: Diagnosis not present

## 2022-04-24 DIAGNOSIS — I10 Essential (primary) hypertension: Secondary | ICD-10-CM | POA: Insufficient documentation

## 2022-04-24 DIAGNOSIS — E119 Type 2 diabetes mellitus without complications: Secondary | ICD-10-CM | POA: Diagnosis not present

## 2022-04-24 DIAGNOSIS — K2100 Gastro-esophageal reflux disease with esophagitis, without bleeding: Secondary | ICD-10-CM | POA: Insufficient documentation

## 2022-04-24 DIAGNOSIS — K449 Diaphragmatic hernia without obstruction or gangrene: Secondary | ICD-10-CM | POA: Insufficient documentation

## 2022-04-24 HISTORY — PX: ESOPHAGOGASTRODUODENOSCOPY (EGD) WITH PROPOFOL: SHX5813

## 2022-04-24 LAB — GLUCOSE, CAPILLARY: Glucose-Capillary: 108 mg/dL — ABNORMAL HIGH (ref 70–99)

## 2022-04-24 SURGERY — ESOPHAGOGASTRODUODENOSCOPY (EGD) WITH PROPOFOL
Anesthesia: General

## 2022-04-24 MED ORDER — PROPOFOL 10 MG/ML IV BOLUS
INTRAVENOUS | Status: DC | PRN
  Administered 2022-04-24: 80 mg via INTRAVENOUS

## 2022-04-24 MED ORDER — PROPOFOL 500 MG/50ML IV EMUL
INTRAVENOUS | Status: DC | PRN
  Administered 2022-04-24: 150 ug/kg/min via INTRAVENOUS

## 2022-04-24 MED ORDER — SODIUM CHLORIDE 0.9 % IV SOLN: INTRAVENOUS | Status: DC

## 2022-04-24 MED ORDER — LIDOCAINE HCL (CARDIAC) PF 100 MG/5ML IV SOSY
PREFILLED_SYRINGE | INTRAVENOUS | Status: DC | PRN
  Administered 2022-04-24: 50 mg via INTRAVENOUS

## 2022-04-24 NOTE — Op Note (Signed)
[facility] Patient Name: Rebecca Kramer Procedure Date: 04/24/2022 9:03 AM MRN: [id number] Account #: [id number] Date of Birth: Jun 15, 1946 Admit Type: Outpatient Age: 76 Room: [facility] ENDO ROOM 3 Gender: Female Note Status: Finalized Instrument Name: Upper Endoscope [id number] Procedure:             Upper GI endoscopy Indications:           Reflux esophagitis Providers:             Andrey Farmer MD, MD Referring MD:          Leonie Douglas. Doy Hutching, MD (Referring MD) Medicines:             Monitored Anesthesia Care Complications:         No immediate complications. Procedure:             Pre-Anesthesia Assessment:                        - Prior to the procedure, a History and Physical was                         performed, and patient medications and allergies were                         reviewed. The patient is competent. The risks and                         benefits of the procedure and the sedation options and                         risks were discussed with the patient. All questions                         were answered and informed consent was obtained.                         Patient identification and proposed procedure were                         verified by the physician, the nurse, the                         anesthesiologist, the anesthetist and the technician                         in the endoscopy suite. Mental Status Examination:                         alert and oriented. Airway Examination: normal                         oropharyngeal airway and neck mobility. Respiratory                         Examination: clear to auscultation. CV Examination:                         normal. Prophylactic Antibiotics: The patient does not  require prophylactic antibiotics. Prior                         Anticoagulants: The patient has taken no previous                         anticoagulant or antiplatelet agents. ASA Grade                          Assessment: II - A patient with mild systemic disease.                         After reviewing the risks and benefits, the patient                         was deemed in satisfactory condition to undergo the                         procedure. The anesthesia plan was to use monitored                         anesthesia care (MAC). Immediately prior to                         administration of medications, the patient was                         re-assessed for adequacy to receive sedatives. The                         heart rate, respiratory rate, oxygen saturations,                         blood pressure, adequacy of pulmonary ventilation, and                         response to care were monitored throughout the                         procedure. The physical status of the patient was                         re-assessed after the procedure.                        After obtaining informed consent, the endoscope was                         passed under direct vision. Throughout the procedure,                         the patient's blood pressure, pulse, and oxygen                         saturations were monitored continuously. The Endoscope                         was introduced through the mouth, and advanced to the  second part of duodenum. The upper GI endoscopy was                         accomplished without difficulty. The patient tolerated                         the procedure well. Findings:      There is no endoscopic evidence of esophagitis in the lower third of the       esophagus.      A 4 cm hiatal hernia was found. The proximal extent of the gastric folds       (end of tubular esophagus) was 35 cm from the incisors. The hiatal       narrowing was 39 cm from the incisors.      The entire examined stomach was normal.      The examined duodenum was normal. Impression:            - 4 cm hiatal hernia.                        - Normal  stomach.                        - Normal examined duodenum.                        - No specimens collected. Recommendation:        - Discharge patient to home.                        - Resume previous diet.                        - Continue present medications.                        - Return to referring physician as previously                         scheduled. Procedure Code(s):     --- Professional ---                        [phone], Esophagogastroduodenoscopy, flexible,                         transoral; diagnostic, including collection of                         specimen(s) by brushing or washing, when performed                         (separate procedure) Diagnosis Code(s):     --- Professional ---                        K44.9, Diaphragmatic hernia without obstruction or                         gangrene                        K21.00, Gastro-esophageal reflux disease with  esophagitis, without bleeding CPT copyright 2019 American Medical Association. All rights reserved. The codes documented in this report are preliminary and upon coder review may  be revised to meet current compliance requirements. Andrey Farmer MD, MD 04/24/2022 9:23:12 AM Number of Addenda: 0 Note Initiated On: 04/24/2022 9:03 AM Estimated Blood Loss:  Estimated blood loss: none.      [facility]

## 2022-04-24 NOTE — Anesthesia Procedure Notes (Signed)
Date/Time: 04/24/2022 9:10 AM  Performed by: Johnna Acosta, CRNAPre-anesthesia Checklist: Patient identified, Emergency Drugs available, Suction available, Patient being monitored and Timeout performed Patient Re-evaluated:Patient Re-evaluated prior to induction Oxygen Delivery Method: Nasal cannula Preoxygenation: Pre-oxygenation with 100% oxygen Induction Type: IV induction Ventilation: Mask ventilation without difficulty

## 2022-04-24 NOTE — Interval H&P Note (Signed)
History and Physical Interval Note:  04/24/2022 9:06 AM  Rebecca Kramer  has presented today for surgery, with the diagnosis of GERD.  The various methods of treatment have been discussed with the patient and family. After consideration of risks, benefits and other options for treatment, the patient has consented to  Procedure(s): ESOPHAGOGASTRODUODENOSCOPY (EGD) WITH PROPOFOL (N/A) as a surgical intervention.  The patient's history has been reviewed, patient examined, no change in status, stable for surgery.  I have reviewed the patient's chart and labs.  Questions were answered to the patient's satisfaction.     [name]  Ok to proceed with EGD

## 2022-04-24 NOTE — Transfer of Care (Signed)
Immediate Anesthesia Transfer of Care Note  Patient: Rebecca Kramer  Procedure(s) Performed: ESOPHAGOGASTRODUODENOSCOPY (EGD) WITH PROPOFOL  Patient Location: PACU  Anesthesia Type:General  Level of Consciousness: sedated  Airway & Oxygen Therapy: Patient Spontanous Breathing  Post-op Assessment: Report given to RN and Post -op Vital signs reviewed and stable  Post vital signs: Reviewed and stable  Last Vitals:  Vitals Value Taken Time  BP 120/73 04/24/22 0922  Temp    Pulse 75 04/24/22 0922  Resp 17 04/24/22 0922  SpO2 95 % 04/24/22 0922    Last Pain:  Vitals:   04/24/22 0840  TempSrc: Temporal  PainSc: 0-No pain         Complications: No notable events documented.

## 2022-04-24 NOTE — Anesthesia Postprocedure Evaluation (Signed)
Anesthesia Post Note  Patient: Rebecca Kramer  Procedure(s) Performed: ESOPHAGOGASTRODUODENOSCOPY (EGD) WITH PROPOFOL  Patient location during evaluation: Endoscopy Anesthesia Type: General Level of consciousness: awake and alert Pain management: pain level controlled Vital Signs Assessment: post-procedure vital signs reviewed and stable Respiratory status: spontaneous breathing, nonlabored ventilation, respiratory function stable and patient connected to nasal cannula oxygen Cardiovascular status: blood pressure returned to baseline and stable Postop Assessment: no apparent nausea or vomiting Anesthetic complications: no   No notable events documented.   Last Vitals:  Vitals:   04/24/22 0930 04/24/22 0940  BP: 122/76 133/86  Pulse: 76 69  Resp: (!) 23 14  Temp:    SpO2: 98% 100%    Last Pain:  Vitals:   04/24/22 0940  TempSrc:   PainSc: 0-No pain                 Dimas Millin

## 2022-04-24 NOTE — Anesthesia Preprocedure Evaluation (Signed)
Anesthesia Evaluation  Patient identified by MRN, date of birth, ID band Patient awake    Reviewed: Allergy & Precautions, NPO status , Patient's Chart, lab work & pertinent test results  History of Anesthesia Complications Negative for: history of anesthetic complications  Airway Mallampati: I   Neck ROM: Full    Dental   Crowns :   Pulmonary sleep apnea and Continuous Positive Airway Pressure Ventilation ,    Pulmonary exam normal breath sounds clear to auscultation       Cardiovascular hypertension, Normal cardiovascular exam Rhythm:Regular Rate:Normal     Neuro/Psych negative neurological ROS     GI/Hepatic PUD, GERD (Barrett esophagus)  ,Paraesophageal hernia   Endo/Other  diabetes, Type 2Hypothyroidism   Renal/GU negative Renal ROS     Musculoskeletal   Abdominal   Peds  Hematology negative hematology ROS (+)   Anesthesia Other Findings   Reproductive/Obstetrics                             Anesthesia Physical  Anesthesia Plan  ASA: 2  Anesthesia Plan: General   Post-op Pain Management:    Induction: Intravenous  PONV Risk Score and Plan: 3 and Propofol infusion, TIVA and Treatment may vary due to age or medical condition  Airway Management Planned: Natural Airway  Additional Equipment:   Intra-op Plan:   Post-operative Plan:   Informed Consent: I have reviewed the patients History and Physical, chart, labs and discussed the procedure including the risks, benefits and alternatives for the proposed anesthesia with the patient or authorized representative who has indicated his/her understanding and acceptance.       Plan Discussed with: CRNA  Anesthesia Plan Comments: (LMA/GETA backup discussed.  Patient consented for risks of anesthesia including but not limited to:  - adverse reactions to medications - damage to eyes, teeth, lips or other oral mucosa - nerve  damage due to positioning  - sore throat or hoarseness - damage to heart, brain, nerves, lungs, other parts of body or loss of life  Informed patient about role of CRNA in peri- and intra-operative care.  Patient voiced understanding.)        Anesthesia Quick Evaluation

## 2022-04-24 NOTE — H&P (Signed)
Outpatient short stay form Pre-procedure 04/24/2022  Rebecca Rubenstein, MD  Primary Physician: Idelle Crouch, MD  Reason for visit:  Esophagitis  History of present illness:    76 y/o lady with hypertension, hypothyroidism, and DM II here for EGD to assess healing of esophagitis. No dysphagia. No blood thinners. No neck surgeries. No family history of GI malignancies.    Current Facility-Administered Medications:    0.9 %  sodium chloride infusion, , Intravenous, Continuous, Jasmarie Coppock, Hilton Cork, MD, Last Rate: 20 mL/hr at 04/24/22 0855, New Bag at 04/24/22 0855  Medications Prior to Admission  Medication Sig Dispense Refill Last Dose   amLODipine (NORVASC) 5 MG tablet    04/24/2022 at 0730   levothyroxine (SYNTHROID) 100 MCG tablet Take 1 tablet (100 mcg total) by mouth daily before breakfast. 90 tablet 3 04/24/2022 at 0630   losartan (COZAAR) 50 MG tablet Take 1 tablet (50 mg total) by mouth daily. (Patient taking differently: Take 50 mg by mouth 2 times daily at 12 noon and 4 pm.) 90 tablet 1 04/24/2022 at 0730   metFORMIN (GLUCOPHAGE-XR) 500 MG 24 hr tablet TAKE 4 TABLETS DAILY WITH SUPPER 360 tablet 3 04/23/2022   calcium-vitamin D (OSCAL WITH D) 500-200 MG-UNIT tablet Take 1 tablet by mouth.      dapagliflozin propanediol (FARXIGA) 5 MG TABS tablet Take 1 tablet (5 mg total) by mouth daily before breakfast. 7 tablet 0    Multiple Vitamin (MULTIVITAMIN) tablet Take 1 tablet by mouth daily.      pantoprazole (PROTONIX) 40 MG tablet TAKE 1 TABLET ONCE DAILY.  TAKE 30 TO 45 MINUTES BEFORE BREAKFAST      sucralfate (CARAFATE) 1 GM/10ML suspension Take 1 g by mouth 4 (four) times daily -  with meals and at bedtime.        Allergies  Allergen Reactions   Benzonatate     Hot flash   Dilaudid [Hydromorphone Hcl] Other (See Comments)    Face turned red; bp     Past Medical History:  Diagnosis Date   Arthritis    Barrett's esophagus    Bone spur    Left foot / posterior    Diabetes mellitus without complication ([place])    Diverticulosis    GERD (gastroesophageal reflux disease)    esophagitis with ulceration   Hypertension    Hypothyroidism    Menopausal symptoms    hot flashes   Osteopenia    Pure hypercholesterolemia    Sleep apnea 2007   AHI 45/hr in 2007    Review of systems:  Otherwise negative.    Physical Exam  Gen: Alert, oriented. Appears stated age.  HEENT: PERRLA. Lungs: No respiratory distress CV: RRR Abd: soft, benign, no masses Ext: No edema    Planned procedures: Proceed with EGD. The patient understands the nature of the planned procedure, indications, risks, alternatives and potential complications including but not limited to bleeding, infection, perforation, damage to internal organs and possible oversedation/side effects from anesthesia. The patient agrees and gives consent to proceed.  Please refer to procedure notes for findings, recommendations and patient disposition/instructions.     Rebecca Rubenstein, MD [facility]

## 2022-05-06 HISTORY — PX: ESOPHAGOGASTRIC FUNDOPLICATION: SHX405

## 2022-05-12 ENCOUNTER — Ambulatory Visit: Payer: BC Managed Care – PPO | Admitting: Dermatology

## 2022-05-12 ENCOUNTER — Encounter: Payer: Self-pay | Admitting: Dermatology

## 2022-05-12 DIAGNOSIS — L578 Other skin changes due to chronic exposure to nonionizing radiation: Secondary | ICD-10-CM

## 2022-05-12 DIAGNOSIS — Z1283 Encounter for screening for malignant neoplasm of skin: Secondary | ICD-10-CM | POA: Diagnosis not present

## 2022-05-12 DIAGNOSIS — L814 Other melanin hyperpigmentation: Secondary | ICD-10-CM | POA: Diagnosis not present

## 2022-05-12 DIAGNOSIS — D229 Melanocytic nevi, unspecified: Secondary | ICD-10-CM

## 2022-05-12 DIAGNOSIS — L821 Other seborrheic keratosis: Secondary | ICD-10-CM

## 2022-05-12 DIAGNOSIS — L988 Other specified disorders of the skin and subcutaneous tissue: Secondary | ICD-10-CM

## 2022-05-12 NOTE — Patient Instructions (Signed)
Recommend taking Heliocare sun protection supplement daily in sunny weather for additional sun protection. For maximum protection on the sunniest days, you can take up to 2 capsules of regular Heliocare OR take 1 capsule of Heliocare Ultra. For prolonged exposure (such as a full day in the sun), you can repeat your dose of the supplement 4 hours after your first dose. Heliocare can be purchased at [facility], at some [facility] or at www.heliocare.com.    Recommend daily broad spectrum sunscreen SPF 30+ to sun-exposed areas, reapply every 2 hours as needed. Call for new or changing lesions.  Staying in the shade or wearing long sleeves, sun glasses (UVA+UVB protection) and wide brim hats (4-inch brim around the entire circumference of the hat) are also recommended for sun protection.    Melanoma ABCDEs  Melanoma is the most dangerous type of skin cancer, and is the leading cause of death from skin disease.  You are more likely to develop melanoma if you: Have light-colored skin, light-colored eyes, or red or blond hair Spend a lot of time in the sun Tan regularly, either outdoors or in a tanning bed Have had blistering sunburns, especially during childhood Have a close family member who has had a melanoma Have atypical moles or large birthmarks  Early detection of melanoma is key since treatment is typically straightforward and cure rates are extremely high if we catch it early.   The first sign of melanoma is often a change in a mole or a new dark spot.  The ABCDE system is a way of remembering the signs of melanoma.  A for asymmetry:  The two halves do not match. B for border:  The edges of the growth are irregular. C for color:  A mixture of colors are present instead of an even brown color. D for diameter:  Melanomas are usually (but not always) greater than 6mm - the size of a pencil eraser. E for evolution:  The spot keeps changing in size, shape, and color.  Please check  your skin once per month between visits. You can use a small mirror in front and a large mirror behind you to keep an eye on the back side or your body.   If you see any new or changing lesions before your next follow-up, please call to schedule a visit.  Please continue daily skin protection including broad spectrum sunscreen SPF 30+ to sun-exposed areas, reapplying every 2 hours as needed when you're outdoors.   Staying in the shade or wearing long sleeves, sun glasses (UVA+UVB protection) and wide brim hats (4-inch brim around the entire circumference of the hat) are also recommended for sun protection.    Due to recent changes in healthcare laws, you may see results of your pathology and/or laboratory studies on MyChart before the doctors have had a chance to review them. We understand that in some cases there may be results that are confusing or concerning to you. Please understand that not all results are received at the same time and often the doctors may need to interpret multiple results in order to provide you with the best plan of care or course of treatment. Therefore, we ask that you please give us 2 business days to thoroughly review all your results before contacting the office for clarification. Should we see a critical lab result, you will be contacted sooner.   If You Need Anything After Your Visit  If you have any questions or concerns for your doctor, please   call our main line at [phone] and press option 4 to reach your doctor's medical assistant. If no one answers, please leave a voicemail as directed and we will return your call as soon as possible. Messages left after 4 pm will be answered the following business day.   You may also send us a message via MyChart. We typically respond to MyChart messages within 1-2 business days.  For prescription refills, please ask your pharmacy to contact our office. Our fax number is [phone].  If you have an urgent issue when the  clinic is closed that cannot wait until the next business day, you can page your doctor at the number below.    Please note that while we do our best to be available for urgent issues outside of office hours, we are not available 24/7.   If you have an urgent issue and are unable to reach us, you may choose to seek medical care at your doctor's office, retail clinic, urgent care center, or emergency room.  If you have a medical emergency, please immediately call 911 or go to the emergency department.  Pager Numbers  - Dr. Kowalski: [phone]  - Dr. Moye: [phone]  - Dr. Stewart: [phone]  In the event of inclement weather, please call our main line at [phone] for an update on the status of any delays or closures.  Dermatology Medication Tips: Please keep the boxes that topical medications come in in order to help keep track of the instructions about where and how to use these. Pharmacies typically print the medication instructions only on the boxes and not directly on the medication tubes.   If your medication is too expensive, please contact our office at [phone] option 4 or send us a message through MyChart.   We are unable to tell what your co-pay for medications will be in advance as this is different depending on your insurance coverage. However, we may be able to find a substitute medication at lower cost or fill out paperwork to get insurance to cover a needed medication.   If a prior authorization is required to get your medication covered by your insurance company, please allow us 1-2 business days to complete this process.  Drug prices often vary depending on where the prescription is filled and some pharmacies may offer cheaper prices.  The website www.goodrx.com contains coupons for medications through different pharmacies. The prices here do not account for what the cost may be with help from insurance (it may be cheaper with your insurance), but the  website can give you the price if you did not use any insurance.  - You can print the associated coupon and take it with your prescription to the pharmacy.  - You may also stop by our office during regular business hours and pick up a GoodRx coupon card.  - If you need your prescription sent electronically to a different pharmacy, notify our office through [facility] MyChart or by phone at [phone] option 4.     Si Usted Necesita Algo Despus de Su Visita  Tambin puede enviarnos un mensaje a travs de MyChart. Por lo general respondemos a los mensajes de MyChart en el transcurso de 1 a 2 das hbiles.  Para renovar recetas, por favor pida a su farmacia que se ponga en contacto con nuestra oficina. Nuestro nmero de fax es el [phone].  Si tiene un asunto urgente cuando la clnica est cerrada y que no puede esperar hasta el siguiente da hbil,   puede llamar/localizar a su doctor(a) al nmero que aparece a continuacin.   Por favor, tenga en cuenta que aunque hacemos todo lo posible para estar disponibles para asuntos urgentes fuera del horario de oficina, no estamos disponibles las 24 horas del da, los 7 das de la semana.   Si tiene un problema urgente y no puede comunicarse con nosotros, puede optar por buscar atencin mdica  en el consultorio de su doctor(a), en una clnica privada, en un centro de atencin urgente o en una sala de emergencias.  Si tiene una emergencia mdica, por favor llame inmediatamente al 911 o vaya a la sala de emergencias.  Nmeros de bper  - Dr. Kowalski: [phone]  - Dra. Moye: [phone]  - Dra. Stewart: [phone]  En caso de inclemencias del tiempo, por favor llame a nuestra lnea principal al [phone] para una actualizacin sobre el estado de cualquier retraso o cierre.  Consejos para la medicacin en dermatologa: Por favor, guarde las cajas en las que vienen los medicamentos de uso tpico para ayudarle a seguir las  instrucciones sobre dnde y cmo usarlos. Las farmacias generalmente imprimen las instrucciones del medicamento slo en las cajas y no directamente en los tubos del medicamento.   Si su medicamento es muy caro, por favor, pngase en contacto con nuestra oficina llamando al [phone] y presione la opcin 4 o envenos un mensaje a travs de MyChart.   No podemos decirle cul ser su copago por los medicamentos por adelantado ya que esto es diferente dependiendo de la cobertura de su seguro. Sin embargo, es posible que podamos encontrar un medicamento sustituto a menor costo o llenar un formulario para que el seguro cubra el medicamento que se considera necesario.   Si se requiere una autorizacin previa para que su compaa de seguros cubra su medicamento, por favor permtanos de 1 a 2 das hbiles para completar este proceso.  Los precios de los medicamentos varan con frecuencia dependiendo del lugar de dnde se surte la receta y alguna farmacias pueden ofrecer precios ms baratos.  El sitio web www.goodrx.com tiene cupones para medicamentos de diferentes farmacias. Los precios aqu no tienen en cuenta lo que podra costar con la ayuda del seguro (puede ser ms barato con su seguro), pero el sitio web puede darle el precio si no utiliz ningn seguro.  - Puede imprimir el cupn correspondiente y llevarlo con su receta a la farmacia.  - Tambin puede pasar por nuestra oficina durante el horario de atencin regular y recoger una tarjeta de cupones de GoodRx.  - Si necesita que su receta se enve electrnicamente a una farmacia diferente, informe a nuestra oficina a travs de MyChart de [facility] o por telfono llamando al [phone] y presione la opcin 4.  

## 2022-05-12 NOTE — Progress Notes (Signed)
   New Patient Visit  Subjective  Rebecca Kramer is a 76 y.o. female who presents for the following: Annual Exam (Here for skin cancer screening and to establish care. No personal Hx of skin cancer or dysplastic nevi. Would like to discuss Botox).  The patient presents for Total-Body Skin Exam (TBSE) for skin cancer screening and mole check.  The patient has spots, moles and lesions to be evaluated, some may be new or changing and the patient has concerns that these could be cancer.   Review of Systems: No other skin or systemic complaints except as noted in HPI or Assessment and Plan.   Objective  Well appearing patient in no apparent distress; mood and affect are within normal limits.  A full examination was performed including scalp, head, eyes, ears, nose, lips, neck, chest, axillae, abdomen, back, buttocks, bilateral upper extremities, bilateral lower extremities, hands, feet, fingers, toes, fingernails, and toenails. All findings within normal limits unless otherwise noted below.  Head - Anterior (Face) Rhytides and volume loss.    Assessment & Plan   Lentigines - Scattered tan macules - Due to sun exposure - Benign-appearing, observe - Recommend daily broad spectrum sunscreen SPF 30+ to sun-exposed areas, reapply every 2 hours as needed. - Call for any changes  Seborrheic Keratoses - Stuck-on, waxy, tan-brown papules and/or plaques  - Benign-appearing - Discussed benign etiology and prognosis. - Observe - Call for any changes  Melanocytic Nevi - Tan-brown and/or pink-flesh-colored symmetric macules and papules - Benign appearing on exam today - Observation - Call clinic for new or changing moles - Recommend daily use of broad spectrum spf 30+ sunscreen to sun-exposed areas.   Hemangiomas - Red papules - Discussed benign nature - Observe - Call for any changes  Actinic Damage - Chronic condition, secondary to cumulative UV/sun exposure - diffuse scaly  erythematous macules with underlying dyspigmentation - Recommend daily broad spectrum sunscreen SPF 30+ to sun-exposed areas, reapply every 2 hours as needed.  - Staying in the shade or wearing long sleeves, sun glasses (UVA+UVB protection) and wide brim hats (4-inch brim around the entire circumference of the hat) are also recommended for sun protection.  - Call for new or changing lesions.  Skin cancer screening performed today.  Elastosis of skin Head - Anterior (Face)  Discussed Botox and filler   We will request records from [facility] to help with the correct dosing for Botox    Return in about 1 year (around 05/13/2023) for TBSE, Botox, Next Available.  I, Marye Round, CMA, am acting as scribe for Forest Gleason, MD .   Documentation: I have reviewed the above documentation for accuracy and completeness, and I agree with the above.  Forest Gleason, MD

## 2022-05-14 ENCOUNTER — Encounter: Payer: Self-pay | Admitting: Dermatology

## 2022-05-14 ENCOUNTER — Ambulatory Visit (INDEPENDENT_AMBULATORY_CARE_PROVIDER_SITE_OTHER): Payer: Self-pay | Admitting: Dermatology

## 2022-05-14 ENCOUNTER — Ambulatory Visit: Payer: BC Managed Care – PPO | Admitting: Dermatology

## 2022-05-14 DIAGNOSIS — L988 Other specified disorders of the skin and subcutaneous tissue: Secondary | ICD-10-CM

## 2022-05-14 NOTE — Progress Notes (Signed)
   Follow-Up Visit   Subjective  Rebecca Kramer is a 76 y.o. female who presents for the following: Facial Elastosis (Here for Botox).  The following portions of the chart were reviewed this encounter and updated as appropriate:  Tobacco  Allergies  Meds  Problems  Med Hx  Surg Hx  Fam Hx      Review of Systems: No other skin or systemic complaints except as noted in HPI or Assessment and Plan.   Objective  Well appearing patient in no apparent distress; mood and affect are within normal limits.  A focused examination was performed including face. Relevant physical exam findings are noted in the Assessment and Plan.  face Rhytides and volume loss.                     Assessment & Plan  Elastosis of skin face  Botox 46 units injected as noted below:  Botox Injection - face Location: See attached image  Informed consent: Discussed risks (infection, pain, bleeding, bruising, swelling, allergic reaction, paralysis of nearby muscles, eyelid droop, double vision, neck weakness, difficulty breathing, headache, undesirable cosmetic result, and need for additional treatment) and benefits of the procedure, as well as the alternatives.  Informed consent was obtained.  Preparation: The area was cleansed with alcohol.  Procedure Details:  Botox was injected into the dermis with a 30-gauge needle. Pressure applied to any bleeding. Ice packs offered for swelling.  Lot Number:  [id number] Expiration:  08/2024  Total Units Injected:  46  Plan: Patient was instructed to remain upright for 4 hours. Patient was instructed to avoid massaging the face and avoid vigorous exercise for the rest of the day. Tylenol may be used for headache.  Allow 2 weeks before returning to clinic for additional dosing as needed. Patient will call for any problems.    Return in about 2 weeks (around 05/28/2022) for Botox Follow Up.  I, Emelia Salisbury, CMA, am acting as scribe for Forest Gleason,  MD.  Documentation: I have reviewed the above documentation for accuracy and completeness, and I agree with the above.  Forest Gleason, MD

## 2022-05-14 NOTE — Patient Instructions (Signed)
Due to recent changes in healthcare laws, you may see results of your pathology and/or laboratory studies on MyChart before the doctors have had a chance to review them. We understand that in some cases there may be results that are confusing or concerning to you. Please understand that not all results are received at the same time and often the doctors may need to interpret multiple results in order to provide you with the best plan of care or course of treatment. Therefore, we ask that you please give us 2 business days to thoroughly review all your results before contacting the office for clarification. Should we see a critical lab result, you will be contacted sooner.   If You Need Anything After Your Visit  If you have any questions or concerns for your doctor, please call our main line at [phone] and press option 4 to reach your doctor's medical assistant. If no one answers, please leave a voicemail as directed and we will return your call as soon as possible. Messages left after 4 pm will be answered the following business day.   You may also send us a message via MyChart. We typically respond to MyChart messages within 1-2 business days.  For prescription refills, please ask your pharmacy to contact our office. Our fax number is [phone].  If you have an urgent issue when the clinic is closed that cannot wait until the next business day, you can page your doctor at the number below.    Please note that while we do our best to be available for urgent issues outside of office hours, we are not available 24/7.   If you have an urgent issue and are unable to reach us, you may choose to seek medical care at your doctor's office, retail clinic, urgent care center, or emergency room.  If you have a medical emergency, please immediately call 911 or go to the emergency department.  Pager Numbers  - Dr. Kowalski: [phone]  - Dr. Moye: [phone]  - Dr. Stewart:  [phone]  In the event of inclement weather, please call our main line at [phone] for an update on the status of any delays or closures.  Dermatology Medication Tips: Please keep the boxes that topical medications come in in order to help keep track of the instructions about where and how to use these. Pharmacies typically print the medication instructions only on the boxes and not directly on the medication tubes.   If your medication is too expensive, please contact our office at [phone] option 4 or send us a message through MyChart.   We are unable to tell what your co-pay for medications will be in advance as this is different depending on your insurance coverage. However, we may be able to find a substitute medication at lower cost or fill out paperwork to get insurance to cover a needed medication.   If a prior authorization is required to get your medication covered by your insurance company, please allow us 1-2 business days to complete this process.  Drug prices often vary depending on where the prescription is filled and some pharmacies may offer cheaper prices.  The website www.goodrx.com contains coupons for medications through different pharmacies. The prices here do not account for what the cost may be with help from insurance (it may be cheaper with your insurance), but the website can give you the price if you did not use any insurance.  - You can print the associated coupon and take it with   your prescription to the pharmacy.  - You may also stop by our office during regular business hours and pick up a GoodRx coupon card.  - If you need your prescription sent electronically to a different pharmacy, notify our office through [facility] MyChart or by phone at [phone] option 4.     Si Usted Necesita Algo Despus de Su Visita  Tambin puede enviarnos un mensaje a travs de MyChart. Por lo general respondemos a los mensajes de MyChart en el transcurso de 1 a 2  das hbiles.  Para renovar recetas, por favor pida a su farmacia que se ponga en contacto con nuestra oficina. Nuestro nmero de fax es el [phone].  Si tiene un asunto urgente cuando la clnica est cerrada y que no puede esperar hasta el siguiente da hbil, puede llamar/localizar a su doctor(a) al nmero que aparece a continuacin.   Por favor, tenga en cuenta que aunque hacemos todo lo posible para estar disponibles para asuntos urgentes fuera del horario de oficina, no estamos disponibles las 24 horas del da, los 7 das de la semana.   Si tiene un problema urgente y no puede comunicarse con nosotros, puede optar por buscar atencin mdica  en el consultorio de su doctor(a), en una clnica privada, en un centro de atencin urgente o en una sala de emergencias.  Si tiene una emergencia mdica, por favor llame inmediatamente al 911 o vaya a la sala de emergencias.  Nmeros de bper  - Dr. Kowalski: [phone]  - Dra. Moye: [phone]  - Dra. Stewart: [phone]  En caso de inclemencias del tiempo, por favor llame a nuestra lnea principal al [phone] para una actualizacin sobre el estado de cualquier retraso o cierre.  Consejos para la medicacin en dermatologa: Por favor, guarde las cajas en las que vienen los medicamentos de uso tpico para ayudarle a seguir las instrucciones sobre dnde y cmo usarlos. Las farmacias generalmente imprimen las instrucciones del medicamento slo en las cajas y no directamente en los tubos del medicamento.   Si su medicamento es muy caro, por favor, pngase en contacto con nuestra oficina llamando al [phone] y presione la opcin 4 o envenos un mensaje a travs de MyChart.   No podemos decirle cul ser su copago por los medicamentos por adelantado ya que esto es diferente dependiendo de la cobertura de su seguro. Sin embargo, es posible que podamos encontrar un medicamento sustituto a menor costo o llenar un formulario para que el  seguro cubra el medicamento que se considera necesario.   Si se requiere una autorizacin previa para que su compaa de seguros cubra su medicamento, por favor permtanos de 1 a 2 das hbiles para completar este proceso.  Los precios de los medicamentos varan con frecuencia dependiendo del lugar de dnde se surte la receta y alguna farmacias pueden ofrecer precios ms baratos.  El sitio web www.goodrx.com tiene cupones para medicamentos de diferentes farmacias. Los precios aqu no tienen en cuenta lo que podra costar con la ayuda del seguro (puede ser ms barato con su seguro), pero el sitio web puede darle el precio si no utiliz ningn seguro.  - Puede imprimir el cupn correspondiente y llevarlo con su receta a la farmacia.  - Tambin puede pasar por nuestra oficina durante el horario de atencin regular y recoger una tarjeta de cupones de GoodRx.  - Si necesita que su receta se enve electrnicamente a una farmacia diferente, informe a nuestra oficina a travs de MyChart de [facility]   o por telfono llamando al [phone] y presione la opcin 4.  

## 2022-05-18 ENCOUNTER — Encounter: Payer: Self-pay | Admitting: Dermatology

## 2022-05-27 ENCOUNTER — Ambulatory Visit (INDEPENDENT_AMBULATORY_CARE_PROVIDER_SITE_OTHER): Payer: Self-pay | Admitting: Dermatology

## 2022-05-27 DIAGNOSIS — L988 Other specified disorders of the skin and subcutaneous tissue: Secondary | ICD-10-CM

## 2022-05-27 NOTE — Progress Notes (Signed)
   Follow-Up Visit   Subjective  PAT S Rebecca Kramer is a 76 y.o. female who presents for the following: Facial Elastosis (Patient is here today for Botox follow up. She is pleased with results and would face checked to see if any additional recommendations).   The following portions of the chart were reviewed this encounter and updated as appropriate:   Tobacco  Allergies  Meds  Problems  Med Hx  Surg Hx  Fam Hx      Review of Systems:  No other skin or systemic complaints except as noted in HPI or Assessment and Plan.  Objective  Well appearing patient in no apparent distress; mood and affect are within normal limits.  A focused examination was performed including the face. Relevant physical exam findings are noted in the Assessment and Plan.  Face Rhytides and volume loss.                      Assessment & Plan  Elastosis of skin Face  Patient responded well to Botox, recommend adjusting and increasing slightly at next Botox appointment (see diagram).   Return in about 4 months (around 09/25/2022) for Botox injections.  Luther Redo, CMA, am acting as scribe for Forest Gleason, MD .  Documentation: I have reviewed the above documentation for accuracy and completeness, and I agree with the above.  Forest Gleason, MD

## 2022-05-27 NOTE — Patient Instructions (Signed)
Due to recent changes in healthcare laws, you may see results of your pathology and/or laboratory studies on MyChart before the doctors have had a chance to review them. We understand that in some cases there may be results that are confusing or concerning to you. Please understand that not all results are received at the same time and often the doctors may need to interpret multiple results in order to provide you with the best plan of care or course of treatment. Therefore, we ask that you please give us 2 business days to thoroughly review all your results before contacting the office for clarification. Should we see a critical lab result, you will be contacted sooner.   If You Need Anything After Your Visit  If you have any questions or concerns for your doctor, please call our main line at [phone] and press option 4 to reach your doctor's medical assistant. If no one answers, please leave a voicemail as directed and we will return your call as soon as possible. Messages left after 4 pm will be answered the following business day.   You may also send us a message via MyChart. We typically respond to MyChart messages within 1-2 business days.  For prescription refills, please ask your pharmacy to contact our office. Our fax number is [phone].  If you have an urgent issue when the clinic is closed that cannot wait until the next business day, you can page your doctor at the number below.    Please note that while we do our best to be available for urgent issues outside of office hours, we are not available 24/7.   If you have an urgent issue and are unable to reach us, you may choose to seek medical care at your doctor's office, retail clinic, urgent care center, or emergency room.  If you have a medical emergency, please immediately call 911 or go to the emergency department.  Pager Numbers  - Dr. Kowalski: [phone]  - Dr. Moye: [phone]  - Dr. Stewart:  [phone]  In the event of inclement weather, please call our main line at [phone] for an update on the status of any delays or closures.  Dermatology Medication Tips: Please keep the boxes that topical medications come in in order to help keep track of the instructions about where and how to use these. Pharmacies typically print the medication instructions only on the boxes and not directly on the medication tubes.   If your medication is too expensive, please contact our office at [phone] option 4 or send us a message through MyChart.   We are unable to tell what your co-pay for medications will be in advance as this is different depending on your insurance coverage. However, we may be able to find a substitute medication at lower cost or fill out paperwork to get insurance to cover a needed medication.   If a prior authorization is required to get your medication covered by your insurance company, please allow us 1-2 business days to complete this process.  Drug prices often vary depending on where the prescription is filled and some pharmacies may offer cheaper prices.  The website www.goodrx.com contains coupons for medications through different pharmacies. The prices here do not account for what the cost may be with help from insurance (it may be cheaper with your insurance), but the website can give you the price if you did not use any insurance.  - You can print the associated coupon and take it with   your prescription to the pharmacy.  - You may also stop by our office during regular business hours and pick up a GoodRx coupon card.  - If you need your prescription sent electronically to a different pharmacy, notify our office through [facility] MyChart or by phone at [phone] option 4.     Si Usted Necesita Algo Despus de Su Visita  Tambin puede enviarnos un mensaje a travs de MyChart. Por lo general respondemos a los mensajes de MyChart en el transcurso de 1 a 2  das hbiles.  Para renovar recetas, por favor pida a su farmacia que se ponga en contacto con nuestra oficina. Nuestro nmero de fax es el [phone].  Si tiene un asunto urgente cuando la clnica est cerrada y que no puede esperar hasta el siguiente da hbil, puede llamar/localizar a su doctor(a) al nmero que aparece a continuacin.   Por favor, tenga en cuenta que aunque hacemos todo lo posible para estar disponibles para asuntos urgentes fuera del horario de oficina, no estamos disponibles las 24 horas del da, los 7 das de la semana.   Si tiene un problema urgente y no puede comunicarse con nosotros, puede optar por buscar atencin mdica  en el consultorio de su doctor(a), en una clnica privada, en un centro de atencin urgente o en una sala de emergencias.  Si tiene una emergencia mdica, por favor llame inmediatamente al 911 o vaya a la sala de emergencias.  Nmeros de bper  - Dr. Kowalski: [phone]  - Dra. Moye: [phone]  - Dra. Stewart: [phone]  En caso de inclemencias del tiempo, por favor llame a nuestra lnea principal al [phone] para una actualizacin sobre el estado de cualquier retraso o cierre.  Consejos para la medicacin en dermatologa: Por favor, guarde las cajas en las que vienen los medicamentos de uso tpico para ayudarle a seguir las instrucciones sobre dnde y cmo usarlos. Las farmacias generalmente imprimen las instrucciones del medicamento slo en las cajas y no directamente en los tubos del medicamento.   Si su medicamento es muy caro, por favor, pngase en contacto con nuestra oficina llamando al [phone] y presione la opcin 4 o envenos un mensaje a travs de MyChart.   No podemos decirle cul ser su copago por los medicamentos por adelantado ya que esto es diferente dependiendo de la cobertura de su seguro. Sin embargo, es posible que podamos encontrar un medicamento sustituto a menor costo o llenar un formulario para que el  seguro cubra el medicamento que se considera necesario.   Si se requiere una autorizacin previa para que su compaa de seguros cubra su medicamento, por favor permtanos de 1 a 2 das hbiles para completar este proceso.  Los precios de los medicamentos varan con frecuencia dependiendo del lugar de dnde se surte la receta y alguna farmacias pueden ofrecer precios ms baratos.  El sitio web www.goodrx.com tiene cupones para medicamentos de diferentes farmacias. Los precios aqu no tienen en cuenta lo que podra costar con la ayuda del seguro (puede ser ms barato con su seguro), pero el sitio web puede darle el precio si no utiliz ningn seguro.  - Puede imprimir el cupn correspondiente y llevarlo con su receta a la farmacia.  - Tambin puede pasar por nuestra oficina durante el horario de atencin regular y recoger una tarjeta de cupones de GoodRx.  - Si necesita que su receta se enve electrnicamente a una farmacia diferente, informe a nuestra oficina a travs de MyChart de [facility]   o por telfono llamando al [phone] y presione la opcin 4.  

## 2022-06-02 ENCOUNTER — Encounter: Payer: Self-pay | Admitting: Dermatology

## 2022-06-04 ENCOUNTER — Ambulatory Visit: Payer: BC Managed Care – PPO | Admitting: Dermatology

## 2022-07-02 ENCOUNTER — Ambulatory Visit (INDEPENDENT_AMBULATORY_CARE_PROVIDER_SITE_OTHER): Payer: BC Managed Care – PPO | Admitting: Dermatology

## 2022-07-02 ENCOUNTER — Encounter: Payer: Self-pay | Admitting: Dermatology

## 2022-07-02 VITALS — BP 132/84 | HR 87

## 2022-07-02 DIAGNOSIS — L219 Seborrheic dermatitis, unspecified: Secondary | ICD-10-CM

## 2022-07-02 DIAGNOSIS — R21 Rash and other nonspecific skin eruption: Secondary | ICD-10-CM

## 2022-07-02 DIAGNOSIS — L259 Unspecified contact dermatitis, unspecified cause: Secondary | ICD-10-CM

## 2022-07-02 MED ORDER — CLOBETASOL PROPIONATE 0.05 % EX CREA: TOPICAL_CREAM | CUTANEOUS | 1 refills | Status: DC

## 2022-07-02 MED ORDER — FLUOCINOLONE ACETONIDE 0.01 % OT OIL: TOPICAL_OIL | OTIC | 2 refills | Status: DC

## 2022-07-02 NOTE — Progress Notes (Signed)
   Follow-Up Visit   Subjective  Rebecca Kramer is a 76 y.o. female who presents for the following: Rash (Surgery 6 weeks ago, November 17th. Rash started 2 weeks ago. Itches, not painful. Using Triamcinolone 0.1% ointment since 06/24/22, prescribed by GYN, not helping. Spreading. Was not using any topical medications to area for rash. Uses Ivory soap. Has not been outside, not working in yard, does not have any pets).    The following portions of the chart were reviewed this encounter and updated as appropriate:      Review of Systems: No other skin or systemic complaints except as noted in HPI or Assessment and Plan.   Objective  Well appearing patient in no apparent distress; mood and affect are within normal limits.  A focused examination was performed including chest, abdomen. Relevant physical exam findings are noted in the Assessment and Plan.  lower chest, upper abdomen, medial breasts Erythematous vesiculated patches at central upper abdomen, L>R medial breast  Right Ear Pink patches with greasy scale in concha and canal   Assessment & Plan  Rash lower chest, upper abdomen, medial breasts  Contact dermatitis, unclear etiology  Start Clobetasol 0.05% cream twice daily to rash until clear. Avoid applying to face, groin, breast folds and axilla. Use as directed. Long-term use can cause thinning of the skin.  Topical steroids (such as triamcinolone, fluocinolone, fluocinonide, mometasone, clobetasol, halobetasol, betamethasone, hydrocortisone) can cause thinning and lightening of the skin if they are used for too long in the same area. Your physician has selected the right strength medicine for your problem and area affected on the body. Please use your medication only as directed by your physician to prevent side effects.   Recommend OTC Gold Bond Rapid Relief Anti-Itch cream (pramoxine + menthol), CeraVe Anti-itch cream or lotion (pramoxine), Sarna lotion (Original- menthol  + camphor or Sensitive- pramoxine) or Eucerin 12 hour Itch Relief lotion (menthol) up to 3 times per day to areas on body that are itchy.   clobetasol cream (TEMOVATE) 0.05 % - lower chest, upper abdomen, medial breasts Apply twice daily to rash until clear. Avoid applying to face, groin, breast folds and axilla.  Seborrheic dermatitis Right Ear  Chronic and persistent condition with duration or expected duration over one year. Condition is bothersome/symptomatic for patient. Currently flared.   Start Fluocinolone oil 3 drops twice daily to affected ears as needed   Seborrheic Dermatitis  -  is a chronic persistent rash characterized by pinkness and scaling most commonly of the mid face but also can occur on the scalp (dandruff), ears; mid chest, mid back and groin.  It tends to be exacerbated by stress and cooler weather.  People who have neurologic disease may experience new onset or exacerbation of existing seborrheic dermatitis.  The condition is not curable but treatable and can be controlled.    Fluocinolone Acetonide 0.01 % OIL - Right Ear Apply 3 drops to affected ears twice daily as needed   Return in about 2 weeks (around 07/16/2022) for Rash Follow Up.  I, Emelia Salisbury, CMA, am acting as scribe for Brendolyn Patty, MD.  Documentation: I have reviewed the above documentation for accuracy and completeness, and I agree with the above.  Brendolyn Patty MD

## 2022-07-02 NOTE — Patient Instructions (Addendum)
Chest/Abdomen/Breast: Start Clobetasol 0.05% cream twice daily to rash until clear. Avoid applying to face, groin, breast folds and axilla. Use as directed. Long-term use can cause thinning of the skin.  Ears: Start Fluocinolone oil 3 drops twice daily to affected ears as needed  Topical steroids (such as triamcinolone, fluocinolone, fluocinonide, mometasone, clobetasol, halobetasol, betamethasone, hydrocortisone) can cause thinning and lightening of the skin if they are used for too long in the same area. Your physician has selected the right strength medicine for your problem and area affected on the body. Please use your medication only as directed by your physician to prevent side effects.   Recommend OTC [place] Rapid Relief Anti-Itch cream (pramoxine + menthol), CeraVe Anti-itch cream or lotion (pramoxine), Sarna lotion (Original- menthol + camphor or Sensitive- pramoxine) or Eucerin 12 hour Itch Relief lotion (menthol) up to 3 times per day to areas on body that are itchy.   Gentle Skin Care Guide  1. Bathe no more than once a day.  2. Avoid bathing in hot water  3. Use a mild soap like Dove for Sensitive Skin, Vanicream, Cetaphil, CeraVe. Can use Lever 2000 or Cetaphil antibacterial soap  4. Use soap only where you need it. On most days, use it under your arms, between your legs, and on your feet. Let the water rinse other areas unless visibly dirty.  5. When you get out of the bath/shower, use a towel to gently blot your skin dry, don't rub it.  6. While your skin is still a little damp, apply a moisturizing cream such as Vanicream, CeraVe, Cetaphil, Eucerin, Sarna lotion or plain Vaseline Jelly. For hands apply Neutrogena [place] Hand Cream or Excipial Hand Cream.  7. Reapply moisturizer any time you start to itch or feel dry.  8. Sometimes using free and clear laundry detergents can be helpful. Fabric softener sheets should be avoided. Downy Free & Gentle liquid, or any  liquid fabric softener that is free of dyes and perfumes, it acceptable to use  9. If your doctor has given you prescription creams you may apply moisturizers over them      Due to recent changes in healthcare laws, you may see results of your pathology and/or laboratory studies on MyChart before the doctors have had a chance to review them. We understand that in some cases there may be results that are confusing or concerning to you. Please understand that not all results are received at the same time and often the doctors may need to interpret multiple results in order to provide you with the best plan of care or course of treatment. Therefore, we ask that you please give [place] 2 business days to thoroughly review all your results before contacting the office for clarification. Should we see a critical lab result, you will be contacted sooner.   If You Need Anything After Your Visit  If you have any questions or concerns for your doctor, please call our main line at [phone] and press option 4 to reach your doctor's medical assistant. If no one answers, please leave a voicemail as directed and we will return your call as soon as possible. Messages left after 4 pm will be answered the following business day.   You may also send [place] a message via [place]. We typically respond to MyChart messages within 1-2 business days.  For prescription refills, please ask your pharmacy to contact our office. Our fax number is [phone].  If you have an urgent issue when the  clinic is closed that cannot wait until the next business day, you can page your doctor at the number below.    Please note that while we do our best to be available for urgent issues outside of office hours, we are not available 24/7.   If you have an urgent issue and are unable to reach [place], you may choose to seek medical care at your doctor's office, retail clinic, urgent care center, or emergency room.  If you have a medical  emergency, please immediately call 911 or go to the emergency department.  Pager Numbers  - Dr. Nehemiah Massed: [phone]  - Dr. Laurence Ferrari: [phone]  - Dr. Nicole Kindred: [phone]  In the event of inclement weather, please call our main line at [phone] for an update on the status of any delays or closures.  Dermatology Medication Tips: Please keep the boxes that topical medications come in in order to help keep track of the instructions about where and how to use these. Pharmacies typically print the medication instructions only on the boxes and not directly on the medication tubes.   If your medication is too expensive, please contact our office at [phone] option 4 or send [place] a message through [place].   We are unable to tell what your co-pay for medications will be in advance as this is different depending on your insurance coverage. However, we may be able to find a substitute medication at lower cost or fill out paperwork to get insurance to cover a needed medication.   If a prior authorization is required to get your medication covered by your insurance company, please allow [place] 1-2 business days to complete this process.  Drug prices often vary depending on where the prescription is filled and some pharmacies may offer cheaper prices.  The website www.goodrx.com contains coupons for medications through different pharmacies. The prices here do not account for what the cost may be with help from insurance (it may be cheaper with your insurance), but the website can give you the price if you did not use any insurance.  - You can print the associated coupon and take it with your prescription to the pharmacy.  - You may also stop by our office during regular business hours and pick up a GoodRx coupon card.  - If you need your prescription sent electronically to a different pharmacy, notify our office through [facility] or by phone at [phone] option 4.     Si Usted  Necesita Algo Despus de Su Visita  Tambin puede enviarnos un mensaje a travs de Pharmacist, community. Por lo general respondemos a los mensajes de MyChart en el transcurso de 1 a 2 das hbiles.  Para renovar recetas, por favor pida a su farmacia que se ponga en contacto con nuestra oficina. Harland Dingwall de fax es [place] [phone].  Si tiene un asunto urgente cuando la clnica est cerrada y que no puede esperar hasta el siguiente da hbil, puede llamar/localizar a su doctor(a) al nmero que aparece a continuacin.   Por favor, tenga en cuenta que aunque hacemos todo lo posible para estar disponibles para asuntos urgentes fuera del horario de [place], no estamos disponibles las 24 horas del da, los 7 das de la [place].   Si tiene un problema urgente y no puede comunicarse con nosotros, puede optar por buscar atencin mdica  en el consultorio de su doctor(a), en una clnica privada, en un centro de atencin urgente o en una sala de emergencias.  Si tiene [employer]  emergencia mdica, por favor llame inmediatamente al 911 o vaya a la sala de emergencias.  Nmeros de bper  - Dr. Nehemiah Massed: [phone]  - Dra. Moye: [phone]  - Dra. Nicole Kindred: [phone]  En caso de inclemencias del [place], por favor llame a Johnsie Kindred principal al [phone] para una actualizacin sobre el [place] de cualquier retraso o cierre.  Consejos para la medicacin en dermatologa: Por favor, guarde las cajas en las que vienen los medicamentos de uso tpico para ayudarle a seguir las instrucciones sobre dnde y cmo usarlos. Las farmacias generalmente imprimen las instrucciones del medicamento slo en las cajas y no directamente en los tubos del [place].   Si su medicamento es muy caro, por favor, pngase en contacto con Zigmund Daniel llamando al [phone] y presione la opcin 4 o envenos un mensaje a travs de Pharmacist, community.   No podemos decirle cul ser su copago por los medicamentos por adelantado ya que esto es  diferente dependiendo de la cobertura de su seguro. Sin embargo, es posible que podamos encontrar un medicamento sustituto a Electrical engineer un formulario para que el seguro cubra el medicamento que se considera necesario.   Si se requiere una autorizacin previa para que su compaa de seguros [place] su medicamento, por favor permtanos de 1 a 2 das hbiles para completar este proceso.  Los precios de los medicamentos varan con frecuencia dependiendo del Environmental consultant de dnde se surte la receta y alguna farmacias pueden ofrecer precios ms baratos.  El sitio web www.goodrx.com tiene cupones para medicamentos de Airline pilot. Los precios aqu no tienen en cuenta lo que podra costar con la ayuda del seguro (puede ser ms barato con su seguro), pero el sitio web puede darle el precio si no utiliz Research scientist (physical sciences).  - Puede imprimir el cupn correspondiente y llevarlo con su receta a la farmacia.  - Tambin puede pasar por nuestra oficina durante el horario de atencin regular y Charity fundraiser una tarjeta de cupones de GoodRx.  - Si necesita que su receta se enve electrnicamente a una farmacia diferente, informe a nuestra oficina a travs de MyChart de [place] o por telfono llamando al [phone] y presione la opcin 4.

## 2022-07-07 ENCOUNTER — Other Ambulatory Visit: Payer: Self-pay

## 2022-07-07 MED ORDER — PREDNISONE 5 MG PO TABS: ORAL_TABLET | ORAL | 0 refills | Status: DC

## 2022-07-07 NOTE — Telephone Encounter (Signed)
Patient called stating that rash is no better and is still spreading. She is currently using Clobetasol 0.05% cream BID and wants to know if there is anything she can take to improve rash. She called the physician's office where she had surgery a few weeks ago and they recommended taking oral Benadryl. Please advise.

## 2022-07-07 NOTE — Addendum Note (Signed)
Addended by: Rudell Cobb A on: 07/07/2022 02:23 PM   Modules accepted: Orders

## 2022-07-07 NOTE — Addendum Note (Signed)
Addended by: Rudell Cobb A on: 07/07/2022 02:32 PM   Modules accepted: Orders

## 2022-07-07 NOTE — Telephone Encounter (Signed)
Patient informed of information. When sending prescription to pharmacy alert popped up,   "Very High Allergy/Contraindication: predniSONENo reactions specified. No reaction type specified. User documented allergy severity: None specified. Drug Class Match with BENZONATATE (Class: PABA DERIVATIVES). "Hot flash" Details"  Please advise.

## 2022-07-07 NOTE — Telephone Encounter (Signed)
Patient informed Ok to use and to stop medication and contact us if rash worsens. Patient states that she prefers to wait a few more days and if rash worsens she will take the prednisone.

## 2022-07-07 NOTE — Addendum Note (Signed)
Addended by: Rudell Cobb A on: 07/07/2022 09:30 AM   Modules accepted: Orders

## 2022-07-15 ENCOUNTER — Ambulatory Visit (INDEPENDENT_AMBULATORY_CARE_PROVIDER_SITE_OTHER): Payer: BC Managed Care – PPO | Admitting: Dermatology

## 2022-07-15 VITALS — BP 138/79

## 2022-07-15 DIAGNOSIS — R21 Rash and other nonspecific skin eruption: Secondary | ICD-10-CM | POA: Diagnosis not present

## 2022-07-15 NOTE — Progress Notes (Signed)
   Follow-Up Visit   Subjective  Rebecca Kramer is a 77 y.o. female who presents for the following: Follow-up.  Patient presents for 2 week follow-up rash of the upper abdomen, medial breasts, chest area. She was not improving with clobetasol cream, so she called back and was sent in Prednisone 3 week taper. She is improving since starting about a week ago, but still having a few breakouts. Only itchy at times. Not using any moisturizer on aas chest/abdomen.  The following portions of the chart were reviewed this encounter and updated as appropriate:       Review of Systems:  No other skin or systemic complaints except as noted in HPI or Assessment and Plan.  Objective  Well appearing patient in no apparent distress; mood and affect are within normal limits.  A focused examination was performed including face, trunk. Relevant physical exam findings are noted in the Assessment and Plan.  chest, upper abdomen Few scattered pink excoriated papules of the central upper abdomen with associated mod/severe xerosis; mild erythema on the chest.    Assessment & Plan  Rash chest, upper abdomen  Contact Dermatitis, unclear etiology. Improving on prednisone.  Restart clobetasol cream Apply to AA rash QD/BID avoiding skin folds. Caution skin atrophy with long-term use.  Topical steroids (such as triamcinolone, fluocinolone, fluocinonide, mometasone, clobetasol, halobetasol, betamethasone, hydrocortisone) can cause thinning and lightening of the skin if they are used for too long in the same area. Your physician has selected the right strength medicine for your problem and area affected on the body. Please use your medication only as directed by your physician to prevent side effects.   Continue Prednisone '5mg'$  3 week taper as previously prescribed.  Risks of prednisone taper discussed including mood irritability, insomnia, weight gain, stomach ulcers, increased risk of infection, increased blood  sugar (diabetes), hypertension, osteoporosis with long-term or frequent use, and rare risk of avascular necrosis of the hip.   Recommend mild soap and moisturizing cream 1-2 times daily.  Gentle skin care handout provided.  Recommend moisturizing with VaniCream bid to chest/abd, info given. Vanicream body wash and ointment samples given.   Related Medications clobetasol cream (TEMOVATE) 0.05 % Apply twice daily to rash until clear. Avoid applying to face, groin, breast folds and axilla.   Return as scheduled with Dr Laurence Ferrari.  IJamesetta Orleans, CMA, am acting as scribe for Brendolyn Patty, MD .  Documentation: I have reviewed the above documentation for accuracy and completeness, and I agree with the above.  Brendolyn Patty MD

## 2022-07-15 NOTE — Patient Instructions (Addendum)
Restart clobetasol cream - Apply to affected areas rash 1-2 times a day for itch until improved. Avoid skin folds, face, groin, underarms. Topical steroids (such as triamcinolone, fluocinolone, fluocinonide, mometasone, clobetasol, halobetasol, betamethasone, hydrocortisone) can cause thinning and lightening of the skin if they are used for too long in the same area. Your physician has selected the right strength medicine for your problem and area affected on the body. Please use your medication only as directed by your physician to prevent side effects.   Continue Prednisone 3 week taper as prescribed. Risks of prednisone taper discussed including mood irritability, insomnia, weight gain, stomach ulcers, increased risk of infection, increased blood sugar (diabetes), hypertension, osteoporosis with long-term or frequent use, and rare risk of avascular necrosis of the hip.   Gentle Skin Care Guide  1. Bathe no more than once a day.  2. Avoid bathing in hot water  3. Use a mild soap like Dove, Vanicream, Cetaphil, CeraVe. Can use Lever 2000 or Cetaphil antibacterial soap  4. Use soap only where you need it. On most days, use it under your arms, between your legs, and on your feet. Let the water rinse other areas unless visibly dirty.  5. When you get out of the bath/shower, use a towel to gently blot your skin dry, don't rub it.  6. While your skin is still a little damp, apply a moisturizing cream such as Vanicream, CeraVe, Cetaphil, Eucerin, Sarna lotion or plain Vaseline Jelly. For hands apply Neutrogena [place] Hand Cream or Excipial Hand Cream.  7. Reapply moisturizer any time you start to itch or feel dry.  8. Sometimes using free and clear laundry detergents can be helpful. Fabric softener sheets should be avoided. Downy Free & Gentle liquid, or any liquid fabric softener that is free of dyes and perfumes, it acceptable to use  9. If your doctor has given you prescription creams you  may apply moisturizers over them    Due to recent changes in healthcare laws, you may see results of your pathology and/or laboratory studies on MyChart before the doctors have had a chance to review them. We understand that in some cases there may be results that are confusing or concerning to you. Please understand that not all results are received at the same time and often the doctors may need to interpret multiple results in order to provide you with the best plan of care or course of treatment. Therefore, we ask that you please give [place] 2 business days to thoroughly review all your results before contacting the office for clarification. Should we see a critical lab result, you will be contacted sooner.   If You Need Anything After Your Visit  If you have any questions or concerns for your doctor, please call our main line at [phone] and press option 4 to reach your doctor's medical assistant. If no one answers, please leave a voicemail as directed and we will return your call as soon as possible. Messages left after 4 pm will be answered the following business day.   You may also send [place] a message via [place]. We typically respond to MyChart messages within 1-2 business days.  For prescription refills, please ask your pharmacy to contact our office. Our fax number is [phone].  If you have an urgent issue when the clinic is closed that cannot wait until the next business day, you can page your doctor at the number below.    Please note that while we do our best  to be available for urgent issues outside of office hours, we are not available 24/7.   If you have an urgent issue and are unable to reach [place], you may choose to seek medical care at your doctor's office, retail clinic, urgent care center, or emergency room.  If you have a medical emergency, please immediately call 911 or go to the emergency department.  Pager Numbers  - Dr. Nehemiah Massed: [phone]  - Dr. Laurence Ferrari:  [phone]  - Dr. Nicole Kindred: [phone]  In the event of inclement weather, please call our main line at [phone] for an update on the status of any delays or closures.  Dermatology Medication Tips: Please keep the boxes that topical medications come in in order to help keep track of the instructions about where and how to use these. Pharmacies typically print the medication instructions only on the boxes and not directly on the medication tubes.   If your medication is too expensive, please contact our office at [phone] option 4 or send [place] a message through [place].   We are unable to tell what your co-pay for medications will be in advance as this is different depending on your insurance coverage. However, we may be able to find a substitute medication at lower cost or fill out paperwork to get insurance to cover a needed medication.   If a prior authorization is required to get your medication covered by your insurance company, please allow [place] 1-2 business days to complete this process.  Drug prices often vary depending on where the prescription is filled and some pharmacies may offer cheaper prices.  The website www.goodrx.com contains coupons for medications through different pharmacies. The prices here do not account for what the cost may be with help from insurance (it may be cheaper with your insurance), but the website can give you the price if you did not use any insurance.  - You can print the associated coupon and take it with your prescription to the pharmacy.  - You may also stop by our office during regular business hours and pick up a GoodRx coupon card.  - If you need your prescription sent electronically to a different pharmacy, notify our office through [facility] or by phone at [phone] option 4.     Si Usted Necesita Algo Despus de Su Visita  Tambin puede enviarnos un mensaje a travs de Pharmacist, community. Por lo general respondemos a los mensajes de  MyChart en el transcurso de 1 a 2 das hbiles.  Para renovar recetas, por favor pida a su farmacia que se ponga en contacto con nuestra oficina. Harland Dingwall de fax es [place] [phone].  Si tiene un asunto urgente cuando la clnica est cerrada y que no puede esperar hasta el siguiente da hbil, puede llamar/localizar a su doctor(a) al nmero que aparece a continuacin.   Por favor, tenga en cuenta que aunque hacemos todo lo posible para estar disponibles para asuntos urgentes fuera del horario de [place], no estamos disponibles las 24 horas del da, los 7 das de la [place].   Si tiene un problema urgente y no puede comunicarse con nosotros, puede optar por buscar atencin mdica  en el consultorio de su doctor(a), en una clnica privada, en un centro de atencin urgente o en una sala de emergencias.  Si tiene Engineering geologist, por favor llame inmediatamente al 911 o vaya a la sala de emergencias.  Nmeros de bper  - Dr. Nehemiah Massed: [phone]  - Dra. Moye: [phone]  -  Eliberto Ivory: [phone]  En caso de inclemencias del [place], por favor llame a nuestra lnea principal al [phone] para una actualizacin sobre el [place] de cualquier retraso o cierre.  Consejos para la medicacin en dermatologa: Por favor, guarde las cajas en las que vienen los medicamentos de uso tpico para ayudarle a seguir las instrucciones sobre dnde y cmo usarlos. Las farmacias generalmente imprimen las instrucciones del medicamento slo en las cajas y no directamente en los tubos del [place].   Si su medicamento es muy caro, por favor, pngase en contacto con Zigmund Daniel llamando al [phone] y presione la opcin 4 o envenos un mensaje a travs de Pharmacist, community.   No podemos decirle cul ser su copago por los medicamentos por adelantado ya que esto es diferente dependiendo de la cobertura de su seguro. Sin embargo, es posible que podamos encontrar un medicamento sustituto a Contractor un formulario para que el seguro cubra el medicamento que se considera necesario.   Si se requiere una autorizacin previa para que su compaa de seguros [place] su medicamento, por favor permtanos de 1 a 2 das hbiles para completar este proceso.  Los precios de los medicamentos varan con frecuencia dependiendo del Environmental consultant de dnde se surte la receta y alguna farmacias pueden ofrecer precios ms baratos.  El sitio web www.goodrx.com tiene cupones para medicamentos de Airline pilot. Los precios aqu no tienen en cuenta lo que podra costar con la ayuda del seguro (puede ser ms barato con su seguro), pero el sitio web puede darle el precio si no utiliz Research scientist (physical sciences).  - Puede imprimir el cupn correspondiente y llevarlo con su receta a la farmacia.  - Tambin puede pasar por nuestra oficina durante el horario de atencin regular y Charity fundraiser una tarjeta de cupones de GoodRx.  - Si necesita que su receta se enve electrnicamente a una farmacia diferente, informe a nuestra oficina a travs de MyChart de [place] o por telfono llamando al [phone] y presione la opcin 4.

## 2022-09-03 ENCOUNTER — Other Ambulatory Visit: Payer: Self-pay | Admitting: Internal Medicine

## 2022-09-03 ENCOUNTER — Ambulatory Visit: Payer: BC Managed Care – PPO | Admitting: Internal Medicine

## 2022-09-03 ENCOUNTER — Encounter: Payer: Self-pay | Admitting: Internal Medicine

## 2022-09-03 VITALS — BP 130/74 | HR 84 | Ht 65.0 in | Wt 175.0 lb

## 2022-09-03 DIAGNOSIS — E038 Other specified hypothyroidism: Secondary | ICD-10-CM | POA: Diagnosis not present

## 2022-09-03 DIAGNOSIS — E119 Type 2 diabetes mellitus without complications: Secondary | ICD-10-CM

## 2022-09-03 DIAGNOSIS — E663 Overweight: Secondary | ICD-10-CM | POA: Diagnosis not present

## 2022-09-03 DIAGNOSIS — E063 Autoimmune thyroiditis: Secondary | ICD-10-CM | POA: Diagnosis not present

## 2022-09-03 DIAGNOSIS — Z1231 Encounter for screening mammogram for malignant neoplasm of breast: Secondary | ICD-10-CM

## 2022-09-03 NOTE — Progress Notes (Signed)
Patient ID: Rebecca Kramer, female   DOB: 22-Jun-1946, 77 y.o.   MRN: [id number]   HPI  Rebecca Kramer is a 77 y.o.-year-old female, initially referred by her PCP, Dr.Tower, presenting for f/u for Hashimoto hypothyroidism and DM2, non-insulin-dependent, without long-term complications. She was seeing Dr Sharol Roussel [facility]) >> stopped seeing her 2/2 cost.  Last visit 6 months ago.  Interim history: No increased urination, blurry vision, nausea, chest pain. She has acid reflux and hiatal hernia >> she has more regurgitation. She had an Es manometry at [facility] and at last visit was contemplating esophageal surgery.  She was also started on PPIs. Now had surgery (fundoplication) [id number] >> changed diet >> now off PPIs. She was advised to cut down Metformin in half after the Sx >> she uses lower doses during the holidays and just increased it back to full dose 2 weeks ago.  She also developed a rash after the Sx >> started on Prednisone >> completed the course.  Hypothyroidism: - dx'ed "many years ago"  She tried: -Generic levothyroxine -Brand names -Then, Naturethroid 81.5 mg + Cytomel 5 mg bid (equivalent to ~180 mcg LT4)  -then, generic levothyroxine -Changed to brand name Synthroid 11/2019 >> but generic LT4 since 08/2020 per insurance preference.  She takes LT4 100 mcg daily: - in am - fasting - at least 30 min from b'fast - + calcium within >4 hours from levothyroxine - no iron - + multivitamins >4 hours of levothyroxine - >> 2x a day >> now stopped  - not on Biotin (large dose: 1500 mcg)  Reviewed TFTs: 08/27/2022: TSH 1.533 Lab Results  Component Value Date   TSH 1.37 04/14/2022   TSH 1.68 07/21/2018   TSH 1.24 08/19/2017   TSH 1.52 04/28/2016   TSH 2.02 01/16/2016   TSH 1.97 12/05/2015   TSH 1.27 11/19/2015   TSH 0.43 01/10/2015   TSH 1.12 11/26/2014   TSH 0.79 07/23/2014   FREET4 1.13 04/14/2022   FREET4 1.02 07/21/2018   FREET4 0.99 08/19/2017   FREET4  0.88 04/28/2016   FREET4 0.86 01/16/2016   FREET4 1.02 11/19/2015   FREET4 1.33 01/10/2015   FREET4 1.23 07/23/2014   FREET4 1.02 05/08/2014   FREET4 1.4 10/08/2008  11/24/2021: TSH 0.861 05/27/2021: TSH 2.251 11/20/2020: TSH 1.615 06/03/2020: TSH 1.554 08/18/2019: TSH 0.951 11/15/2018: 1.086 05/05/2017: TSH 1.233 total T4 9.4 11/02/2016: TSH 0.435, total T4 11.6  03/14/2014: ATA 313 (<2) TPO Abs 39 (<9) TSH 3.552 (0.35-4.5), free T4 0.82 (0.8-1.8), Free T3 2.6 (2.3-4.2), reverse T3 12 (8-25) At the same time, she hadn't hemoglobin A1c 6.3, fasting insulin 16.6, vitamin D 66  Pt denies: - feeling nodules in neck - hoarseness - dysphagia - choking  No FH of thyroid disease or thyroid cancer. No h/o radiation tx to head or neck. No herbal supplements. No Biotin use. No recent steroids use.   She was exercising at [employer] but not during the coronavirus pandemic Husband has prostate cancer.  DM2: -Diagnosed in 2016  Reviewed HbA1c levels: 08/27/2022: HbA1c 7.3% Lab Results  Component Value Date   HGBA1C 6.6 (A) 03/03/2022   HGBA1C 6.7 (A) 09/02/2021   HGBA1C 6.1 (A) 03/03/2021   HGBA1C 6.8 (A) 09/03/2020   HGBA1C 6.3 (A) 04/30/2020   HGBA1C 6.7 (A) 07/25/2019   HGBA1C 6.4 (A) 03/28/2019   HGBA1C 6.5 08/28/2016   HGBA1C 6.4 12/05/2015   HGBA1C 6.6 (H) 08/27/2015  11/15/2018: HbA1c 6.8% 05/17/2018: HbA1c 6.9%  She is on: -  Metformin ER 500 mg with lunch or dinner >> 1000 mg with dinner >> 2000 >> 1000 >> 2000 mg with dinner   - Farxiga 5 mg before breakfast >> moved before dinner  She checks sugars once a day: - am:135-181 >> 106-148, 184 >> 113-145, 151, 161 >> 125-178 >> 111, 126-185, 190 - 2h after b'fast: n/c >> 131 >> n/c - lunch: 99-110 >> 98-101 >> 138-184 >> 120, 159 >> n/c >> 128>> 116 - 2h after lunch: 113 >> n/c >> 175-185 >> n/c >> 79, 123 >> n/c - dinner: n/c >> 99-115 >> 115-112 >> 115>> n/c >> 111 - 2h after dinner: 163, 166, 247 >> n/c >>  192, 240 >> n/c >> 134 >> n/c - bedtime: 104 >> n/c Lowest: 99 >> 90s >> 135 >> 106 >> 79 >> 125 >> 111 Highest: 170s >> 240 >> 184 >> 161 >> 178 >> 190  She and her husband started walking after the holidays-walking 30 to 40 minutes a day. Started back walking.  No CKD: Component 08/27/22 05/27/22 05/23/22 05/05/22 11/24/21 05/27/21  Glucose 123 High  152 High  170 High   152 High   103 112 High   Sodium 142 138 134 Low  139 137 140  Potassium 4.6 4.3 4.0 4.2 4.5 4.2  Chloride 107 102 101 106 102 103  Carbon Dioxide (CO2) 31.8 26.'7 24 27 '$ 27.8 29.8  Urea Nitrogen (BUN) '14 17 9 13 11 16  '$ Creatinine 0.8 0.9 0.7 0.8 0.8 0.8  Glomerular Filtration Rate (eGFR) 76  66  -- -- 70 70  Calcium 9.3 9.5 8.9 9.4 9.7 9.4  AST 13 17 -- -- 18 20  ALT 9 25 -- -- 16 19  Alk Phos (alkaline Phosphatase) 53 49 -- -- 51 43  Albumin 4.1 4.3 -- -- 4.5 4.3  Bilirubin, Total 0.4 0.4 -- -- 0.4 0.4  Protein, Total 6.2 6.8 -- -- 6.9 6.3  A/G Ratio 2.0 1.7 -- -- 1.9 2.2  11/20/2020 ([facility]): Glu 109, BUN/Cr 15/0.9, GFR 61 06/03/2020: Glu 107, BUN/Cr 15/0.8 08/18/2019: Glucose 173, BUN/Cr 14/0.9 11/15/2018: Glucose 122, BUN/Cr 18/0.8 Lab Results  Component Value Date   BUN 20 08/28/2016   BUN 16 12/05/2015   Lab Results  Component Value Date   CREATININE 1.00 11/03/2021   CREATININE 0.83 08/28/2016  On losartan.  + HL: 08/27/2022:  Ref Range & Units 7 d ago   Cholesterol, Total 100 - 200 mg/dL 183  Triglyceride 35 - 199 mg/dL 186  HDL (High Density Lipoprotein) Cholesterol 35.0 - 85.0 mg/dL 48.1  LDL Calculated 0 - 130 mg/dL 98  VLDL Cholesterol mg/dL 37  Cholesterol/HDL Ratio  3.8  11/24/2021:  Cholesterol, Total 100 - 200 mg/dL 214 High    Triglyceride 35 - 199 mg/dL 155   HDL (High Density Lipoprotein) Cholesterol 35.0 - 85.0 mg/dL 54.2   LDL Calculated 0 - 130 mg/dL 129   VLDL Cholesterol mg/dL 31   Cholesterol/HDL Ratio  3.9   05/27/2021: 195/152/45.5/119 11/20/2020:  202/123/52.3/125 06/03/2020: 206/127/52.7/128 05/11/2019: 188/248/42.7/96 11/15/2018: 185/144/46.5/106 Lab Results  Component Value Date   CHOL 195 08/28/2016   HDL 51.30 08/28/2016   LDLCALC 118 (H) 08/28/2016   LDLDIRECT 129.6 07/17/2011   TRIG 130.0 08/28/2016   CHOLHDL 4 08/28/2016  Not on a statin.  Last eye exam: 10/06/2021: No DR.  She had cataracts >> had surgery 05/2020.    Last foot exam 03/03/2022.  She has sleep apnea - has a  CPAP - does not like the machine.  ROS: + see HPI  I reviewed pt's medications, allergies, PMH, social hx, family hx, and changes were documented in the history of present illness. Otherwise, unchanged from my initial visit note.  Past Medical History:  Diagnosis Date   Arthritis    Barrett's esophagus    Bone spur    Left foot / posterior   Diabetes mellitus without complication (HCC)    Diverticulosis    GERD (gastroesophageal reflux disease)    esophagitis with ulceration   Hypertension    Hypothyroidism    Menopausal symptoms    hot flashes   Osteopenia    Pure hypercholesterolemia    Sleep apnea 2007   AHI 45/hr in 2007   Past Surgical History:  Procedure Laterality Date   ABDOMINAL HYSTERECTOMY  1983   partial/endometriosis   BREAST EXCISIONAL BIOPSY Right    BREAST SURGERY  1970's   breast lumpectomy/benign   BREAST SURGERY  1994   breast implants/saline   CATARACT EXTRACTION     COLONOSCOPY WITH PROPOFOL N/A 04/20/2017   Procedure: COLONOSCOPY WITH PROPOFOL;  Surgeon: Lollie Sails, MD;  Location: [facility] ENDOSCOPY;  Service: Endoscopy;  Laterality: N/A;   COLONOSCOPY WITH PROPOFOL N/A 06/17/2017   Procedure: COLONOSCOPY WITH PROPOFOL;  Surgeon: Lollie Sails, MD;  Location: [facility] ENDOSCOPY;  Service: Endoscopy;  Laterality: N/A;   COLONOSCOPY WITH PROPOFOL N/A 01/16/2022   Procedure: COLONOSCOPY WITH PROPOFOL;  Surgeon: [name], MD;  Location: [facility] ENDOSCOPY;  Service: Endoscopy;  Laterality: N/A;  DM    DORSAL COMPARTMENT RELEASE  05/14/2011   Procedure: RELEASE DORSAL COMPARTMENT (DEQUERVAIN);  Surgeon: Cammie Sickle., MD;  Location: [facility];  Service: Orthopedics;  Laterality: Right;  right release dorsal compartment/ de quervain release with debridement of mucoid cyst   ESOPHAGOGASTRODUODENOSCOPY  03/2008   esophagitis with ulcerations/?barretts   ESOPHAGOGASTRODUODENOSCOPY (EGD) WITH PROPOFOL N/A 04/20/2017   Procedure: ESOPHAGOGASTRODUODENOSCOPY (EGD) WITH PROPOFOL;  Surgeon: Lollie Sails, MD;  Location: [facility] ENDOSCOPY;  Service: Endoscopy;  Laterality: N/A;   ESOPHAGOGASTRODUODENOSCOPY (EGD) WITH PROPOFOL  06/17/2017   Procedure: ESOPHAGOGASTRODUODENOSCOPY (EGD) WITH PROPOFOL;  Surgeon: Lollie Sails, MD;  Location: [facility] ENDOSCOPY;  Service: Endoscopy;;   ESOPHAGOGASTRODUODENOSCOPY (EGD) WITH PROPOFOL N/A 01/16/2022   Procedure: ESOPHAGOGASTRODUODENOSCOPY (EGD) WITH PROPOFOL;  Surgeon: [name], MD;  Location: [facility] ENDOSCOPY;  Service: Endoscopy;  Laterality: N/A;   ESOPHAGOGASTRODUODENOSCOPY (EGD) WITH PROPOFOL N/A 04/24/2022   Procedure: ESOPHAGOGASTRODUODENOSCOPY (EGD) WITH PROPOFOL;  Surgeon: [name], MD;  Location: [facility] ENDOSCOPY;  Service: Endoscopy;  Laterality: N/A;   EYE SURGERY     thumb nodule     removed   TONSILLECTOMY     History   Social History   Marital Status: Married    Spouse Name: N/A    Number of Children: 2   Occupational History   retired   Social History Main Topics   Smoking status: Never Smoker    Smokeless tobacco: Never Used   Alcohol Use: No   Drug Use: No   Current Outpatient Medications on File Prior to Visit  Medication Sig Dispense Refill   amLODipine (NORVASC) 5 MG tablet      calcium-vitamin D (OSCAL WITH D) 500-200 MG-UNIT tablet Take 1 tablet by mouth.     clobetasol cream (TEMOVATE) 0.05 % Apply twice daily to rash until clear. Avoid applying to face, groin, breast folds and  axilla. 60 g 1   dapagliflozin propanediol (  FARXIGA) 5 MG TABS tablet Take 1 tablet (5 mg total) by mouth daily before breakfast. 7 tablet 0   Fluocinolone Acetonide 0.01 % OIL Apply 3 drops to affected ears twice daily as needed 20 mL 2   levothyroxine (SYNTHROID) 100 MCG tablet Take 1 tablet (100 mcg total) by mouth daily before breakfast. 90 tablet 3   losartan (COZAAR) 50 MG tablet Take 1 tablet (50 mg total) by mouth daily. (Patient taking differently: Take 50 mg by mouth 2 times daily at 12 noon and 4 pm.) 90 tablet 1   metFORMIN (GLUCOPHAGE-XR) 500 MG 24 hr tablet TAKE 4 TABLETS DAILY WITH SUPPER 360 tablet 3   Multiple Vitamin (MULTIVITAMIN) tablet Take 1 tablet by mouth daily.     pantoprazole (PROTONIX) 40 MG tablet TAKE 1 TABLET ONCE DAILY.  TAKE 30 TO 45 MINUTES BEFORE BREAKFAST     predniSONE (DELTASONE) 5 MG tablet Take prednisone by mouth daily in morning with food starting at 60 mg dosage and gradually decreasing daily dose as directed until gone for a three week taper. Patient was given written instructions. Potential side effects reviewed. 150 tablet 0   No current facility-administered medications on file prior to visit.   Allergies  Allergen Reactions   Benzonatate     Hot flash   Dilaudid [Hydromorphone Hcl] Other (See Comments)    Face turned red; bp   Family History  Problem Relation Age of Onset   Hypertension Mother    Hyperlipidemia Mother    Colon polyps Mother    Heart disease Mother    Migraines Mother    Cancer Father        lung CA   Migraines Brother    Cancer Maternal Aunt        breast CA   Breast cancer Maternal Aunt    PE: BP 130/74 (BP Location: Left Arm, Patient Position: Sitting, Cuff Size: Normal)   Pulse 84   Ht '5\' 5"'$  (1.651 m)   Wt 175 lb (79.4 kg)   SpO2 95%   BMI 29.12 kg/m  Wt Readings from Last 3 Encounters:  09/03/22 175 lb (79.4 kg)  04/24/22 180 lb (81.6 kg)  03/03/22 185 lb 6.4 oz (84.1 kg)   Constitutional: overweight,  in NAD Eyes: no exophthalmos ENT: no masses palpated in neck, no cervical lymphadenopathy Cardiovascular: RRR, No MRG Respiratory: CTA B Musculoskeletal: no deformities Skin: no rashes Neurological: no tremor with outstretched hands  ASSESSMENT: 1. Hypothyroidism - Due to Hashimoto's thyroiditis  2. DM2, non-insulin-dependent  3.  Overweight  PLAN:  1. Patient with longstanding Hashimoto's hypothyroidism on LT4 therapy  - she was previously on Synthroid but this was not covered by insurance so we switched to generic, now tolerated well - latest thyroid labs reviewed with pt. >> normal earlier this month (see HPI) - she continues on LT4 100 mcg daily - pt feels good on this dose. - we discussed about taking the thyroid hormone every day, with water, >30 minutes before breakfast, separated by >4 hours from acid reflux medications, calcium, iron, multivitamins. Pt. is taking it correctly.  Before last visit she started Protonix which she was taking 1 hour before levothyroxine.  We discussed about trying to move the PPI later in the day but she was not able to do so so in that case I advised her to chew LT4 for better absorption.  2.  DM2, non-insulin-dependent -Well-controlled, on oral antidiabetic regimen with metformin and SGLT2 inhibitor -At last visit,  HbA1c was lower, at 6.6%.  However, she had another HbA1c since then which is higher, at 7.3% 1 week ago -At last visit, sugars were at or above target in the morning but higher right before the visit.  They were usually at goal later in the day.  We discussed about moving [place] before dinner to hopefully improve the morning sugars.  She was very reticent to add another medication.  We did discuss about possibly adding a GLP-1 receptor agonist but I was reticent to start this that she was contemplating esophageal surgery. -At today's visit, sugars are higher in the morning, possibly for several reasons including her esophageal surgery,  the holidays, being less active, reducing the metformin dose, and prednisone treatment.  2 weeks ago, she increase the metformin dose back but sugars are still elevated in the morning.  She did not check consistently later in the day to understand trends.  She has 2 values in the last 2 months checked before lunch and before dinner and these are at goal.  At today's visit I again advised her to check some blood sugars later in the day, especially after dinner.  For now, I advised her to continue the current regimen with the above factors improving.  I did advise her to move [place] before dinner, since she is not taking it after dinner. - I advised her to: Patient Instructions  Please continue: - Metformin 2000 mg with dinner - Farxiga 5 mg - move this before dinner  Please continue Synthroid 100 mcg daily - Chew the tablet.  Take the thyroid hormone every day, with water, at least 30 minutes before breakfast, separated by at least 4 hours from: - acid reflux medications - calcium - iron - multivitamins  Please come back for a follow-up appointment in 4 months.  - advised to check sugars at different times of the day - 1x a day, rotating check times - advised for yearly eye exams >> she is UTD - return to clinic in 4 months -At last visit, we discussed about adding a statin but she was very reticent to do this due to pill burden.  3.  Overweight -We discussed in the past about stopping snacking after dinner -She would like to lose 30 pounds to hopefully improve her sleep apnea -Will continue Farxiga, which should also help with weight loss -I also recommended a weight management clinic at [facility], but did she did not pursue this -Before last visit, she lost 1 pound -She lost 10 pounds since then  Philemon Kingdom, MD PhD [facility] Endocrinology

## 2022-09-03 NOTE — Patient Instructions (Addendum)
Please continue: - Metformin 2000 mg with dinner - Farxiga 5 mg - move this before dinner  Please continue Synthroid 100 mcg daily - Chew the tablet.  Take the thyroid hormone every day, with water, at least 30 minutes before breakfast, separated by at least 4 hours from: - acid reflux medications - calcium - iron - multivitamins  Please come back for a follow-up appointment in 4 months.

## 2022-09-07 ENCOUNTER — Other Ambulatory Visit: Payer: Self-pay | Admitting: Internal Medicine

## 2022-09-07 DIAGNOSIS — E119 Type 2 diabetes mellitus without complications: Secondary | ICD-10-CM

## 2022-09-23 ENCOUNTER — Ambulatory Visit: Payer: BC Managed Care – PPO | Admitting: Dermatology

## 2022-09-23 VITALS — BP 127/81 | HR 85

## 2022-09-23 DIAGNOSIS — L988 Other specified disorders of the skin and subcutaneous tissue: Secondary | ICD-10-CM

## 2022-09-23 DIAGNOSIS — L650 Telogen effluvium: Secondary | ICD-10-CM | POA: Diagnosis not present

## 2022-09-23 DIAGNOSIS — L649 Androgenic alopecia, unspecified: Secondary | ICD-10-CM | POA: Diagnosis not present

## 2022-09-23 DIAGNOSIS — Z7189 Other specified counseling: Secondary | ICD-10-CM | POA: Diagnosis not present

## 2022-09-23 MED ORDER — DUTASTERIDE 0.5 MG PO CAPS: 0.5000 mg | ORAL_CAPSULE | Freq: Every day | ORAL | 5 refills | Status: DC

## 2022-09-23 NOTE — Progress Notes (Signed)
Follow-Up Visit   Subjective  Rebecca Kramer is a 77 y.o. female who presents for the following: Botox for facial elastosis patient would like to discuss hair thinning which has been happening for years, but recently over the past couple of months has increased. She does have thyroid issues, has had Covid, and had surgery in November.  The following portions of the chart were reviewed this encounter and updated as appropriate: medications, allergies, medical history  Review of Systems:  No other skin or systemic complaints except as noted in HPI or Assessment and Plan.  Objective  Well appearing patient in no apparent distress; mood and affect are within normal limits.  A focused examination was performed of the face.    Scalp Diffuse thinning of the crown and widening of the midline part with retention of the frontal hairline           Scalp Diffuse thinning of hair, positive hair pull test.     Assessment & Plan   Androgenic alopecia Scalp  Chronic and persistent condition with duration or expected duration over one year. Condition is symptomatic / bothersome to patient. Not to goal.  Also with telogen effluvium likely secondary to recent surgery in November -   Female Androgenic Alopecia is a chronic condition related to genetics and/or hormonal changes.  In women androgenetic alopecia is commonly associated with menopause but may occur any time after puberty.  It causes hair thinning primarily on the crown with widening of the part and temporal hairline recession.  Can use OTC Rogaine (minoxidil) 5% solution/foam as directed.  Oral treatments in female patients who have no contraindication may include : - Low dose oral minoxidil 1.25 - 5mg  daily - Spironolactone 50 - 100mg  bid - Finasteride 2.5 - 5 mg daily Adjunctive therapies include: - Low Level Laser Light Therapy (LLLT) - Platelet-rich plasma injections (PRP) - Hair Transplants or scalp reduction    Start Dutasteride 0.5 mg po QD. If too expensive patient to contact office and we will send in Finasteride instead. Discussed Ketoconazole 2% shampoo and Rosemary oil based shampoos.  Minoxidil deferred.  Telogen effluvium Scalp  Telogen Effluvium Counseling Telogen effluvium is a benign, self-limited condition causing increased hair shedding usually for several months. It does not progress to baldness, and the hair eventually grows back on its own. It can be triggered by recent illness, recent surgery, thyroid disease, low iron stores, vitamin D deficiency, fad diets or rapid weight loss, hormonal changes such as pregnancy or birth control pills, and some medication. Usually the hair loss starts 2-3 months after the illness or health change. Rarely, it can continue for longer than a year. Treatments options may include oral or topical Minoxidil; Red Light scalp treatments; Biotin 2.5 mg daily and other options.  Minoxidil deferred.  If this does not resolve in a few months, consider blood work. TSH from February wnl.   Facial Elastosis  Patient not concerned about injecting Botox into the forehead, but I do recommend injecting the lat forehead to prevent "Spocking" that can occur from injecting the frown complex.   Botox 40 units injected as marked: - Frown complex 16 units - Crow's feet 10 units each for a total of 20 units - DAO's 3 units each for a total of 6 units - Forehead 0.5 units each for a total of 1 units  Location: See attached image  Informed consent: Discussed risks (infection, pain, bleeding, bruising, swelling, allergic reaction, paralysis of nearby muscles, eyelid  droop, double vision, neck weakness, difficulty breathing, headache, undesirable cosmetic result, and need for additional treatment) and benefits of the procedure, as well as the alternatives.  Informed consent was obtained.  Preparation: The area was cleansed with alcohol.  Procedure Details:  Botox  was injected into the dermis with a 30-gauge needle. Pressure applied to any bleeding. Ice packs offered for swelling.  Lot Number:  [id number] Expiration:  09/2024  Total Units Injected:  43  Plan: Tylenol may be used for headache.  Allow 2 weeks before returning to clinic for additional dosing as needed. Patient will call for any problems.   Return in about 3 months (around 12/24/2022) for Botox injections.  Luther Redo, CMA, am acting as scribe for Forest Gleason, MD .   Documentation: I have reviewed the above documentation for accuracy and completeness, and I agree with the above.  Forest Gleason, MD

## 2022-09-23 NOTE — Patient Instructions (Signed)
Due to recent changes in healthcare laws, you may see results of your pathology and/or laboratory studies on MyChart before the doctors have had a chance to review them. We understand that in some cases there may be results that are confusing or concerning to you. Please understand that not all results are received at the same time and often the doctors may need to interpret multiple results in order to provide you with the best plan of care or course of treatment. Therefore, we ask that you please give us 2 business days to thoroughly review all your results before contacting the office for clarification. Should we see a critical lab result, you will be contacted sooner.   If You Need Anything After Your Visit  If you have any questions or concerns for your doctor, please call our main line at [phone] and press option 4 to reach your doctor's medical assistant. If no one answers, please leave a voicemail as directed and we will return your call as soon as possible. Messages left after 4 pm will be answered the following business day.   You may also send us a message via MyChart. We typically respond to MyChart messages within 1-2 business days.  For prescription refills, please ask your pharmacy to contact our office. Our fax number is [phone].  If you have an urgent issue when the clinic is closed that cannot wait until the next business day, you can page your doctor at the number below.    Please note that while we do our best to be available for urgent issues outside of office hours, we are not available 24/7.   If you have an urgent issue and are unable to reach us, you may choose to seek medical care at your doctor's office, retail clinic, urgent care center, or emergency room.  If you have a medical emergency, please immediately call 911 or go to the emergency department.  Pager Numbers  - Dr. Kowalski: [phone]  - Dr. Moye: [phone]  - Dr. Stewart:  [phone]  In the event of inclement weather, please call our main line at [phone] for an update on the status of any delays or closures.  Dermatology Medication Tips: Please keep the boxes that topical medications come in in order to help keep track of the instructions about where and how to use these. Pharmacies typically print the medication instructions only on the boxes and not directly on the medication tubes.   If your medication is too expensive, please contact our office at [phone] option 4 or send us a message through MyChart.   We are unable to tell what your co-pay for medications will be in advance as this is different depending on your insurance coverage. However, we may be able to find a substitute medication at lower cost or fill out paperwork to get insurance to cover a needed medication.   If a prior authorization is required to get your medication covered by your insurance company, please allow us 1-2 business days to complete this process.  Drug prices often vary depending on where the prescription is filled and some pharmacies may offer cheaper prices.  The website www.goodrx.com contains coupons for medications through different pharmacies. The prices here do not account for what the cost may be with help from insurance (it may be cheaper with your insurance), but the website can give you the price if you did not use any insurance.  - You can print the associated coupon and take it with   your prescription to the pharmacy.  - You may also stop by our office during regular business hours and pick up a GoodRx coupon card.  - If you need your prescription sent electronically to a different pharmacy, notify our office through [facility] MyChart or by phone at [phone] option 4.     Si Usted Necesita Algo Despus de Su Visita  Tambin puede enviarnos un mensaje a travs de MyChart. Por lo general respondemos a los mensajes de MyChart en el transcurso de 1 a 2  das hbiles.  Para renovar recetas, por favor pida a su farmacia que se ponga en contacto con nuestra oficina. Nuestro nmero de fax es el [phone].  Si tiene un asunto urgente cuando la clnica est cerrada y que no puede esperar hasta el siguiente da hbil, puede llamar/localizar a su doctor(a) al nmero que aparece a continuacin.   Por favor, tenga en cuenta que aunque hacemos todo lo posible para estar disponibles para asuntos urgentes fuera del horario de oficina, no estamos disponibles las 24 horas del da, los 7 das de la semana.   Si tiene un problema urgente y no puede comunicarse con nosotros, puede optar por buscar atencin mdica  en el consultorio de su doctor(a), en una clnica privada, en un centro de atencin urgente o en una sala de emergencias.  Si tiene una emergencia mdica, por favor llame inmediatamente al 911 o vaya a la sala de emergencias.  Nmeros de bper  - Dr. Kowalski: [phone]  - Dra. Moye: [phone]  - Dra. Stewart: [phone]  En caso de inclemencias del tiempo, por favor llame a nuestra lnea principal al [phone] para una actualizacin sobre el estado de cualquier retraso o cierre.  Consejos para la medicacin en dermatologa: Por favor, guarde las cajas en las que vienen los medicamentos de uso tpico para ayudarle a seguir las instrucciones sobre dnde y cmo usarlos. Las farmacias generalmente imprimen las instrucciones del medicamento slo en las cajas y no directamente en los tubos del medicamento.   Si su medicamento es muy caro, por favor, pngase en contacto con nuestra oficina llamando al [phone] y presione la opcin 4 o envenos un mensaje a travs de MyChart.   No podemos decirle cul ser su copago por los medicamentos por adelantado ya que esto es diferente dependiendo de la cobertura de su seguro. Sin embargo, es posible que podamos encontrar un medicamento sustituto a menor costo o llenar un formulario para que el  seguro cubra el medicamento que se considera necesario.   Si se requiere una autorizacin previa para que su compaa de seguros cubra su medicamento, por favor permtanos de 1 a 2 das hbiles para completar este proceso.  Los precios de los medicamentos varan con frecuencia dependiendo del lugar de dnde se surte la receta y alguna farmacias pueden ofrecer precios ms baratos.  El sitio web www.goodrx.com tiene cupones para medicamentos de diferentes farmacias. Los precios aqu no tienen en cuenta lo que podra costar con la ayuda del seguro (puede ser ms barato con su seguro), pero el sitio web puede darle el precio si no utiliz ningn seguro.  - Puede imprimir el cupn correspondiente y llevarlo con su receta a la farmacia.  - Tambin puede pasar por nuestra oficina durante el horario de atencin regular y recoger una tarjeta de cupones de GoodRx.  - Si necesita que su receta se enve electrnicamente a una farmacia diferente, informe a nuestra oficina a travs de MyChart de [facility]   o por telfono llamando al [phone] y presione la opcin 4.  

## 2022-09-28 ENCOUNTER — Other Ambulatory Visit: Payer: Self-pay

## 2022-09-28 ENCOUNTER — Encounter: Payer: Self-pay | Admitting: Dermatology

## 2022-09-28 DIAGNOSIS — L650 Telogen effluvium: Secondary | ICD-10-CM

## 2022-09-28 MED ORDER — DUTASTERIDE 0.5 MG PO CAPS: 0.5000 mg | ORAL_CAPSULE | Freq: Every day | ORAL | 5 refills | Status: DC

## 2022-10-06 ENCOUNTER — Ambulatory Visit
Admission: RE | Admit: 2022-10-06 | Discharge: 2022-10-06 | Disposition: A | Discharge status: Home / Self Care | Payer: BC Managed Care – PPO | Source: Ambulatory Visit | Attending: Internal Medicine | Admitting: Internal Medicine

## 2022-10-06 DIAGNOSIS — Z1231 Encounter for screening mammogram for malignant neoplasm of breast: Secondary | ICD-10-CM | POA: Insufficient documentation

## 2022-10-07 ENCOUNTER — Ambulatory Visit (INDEPENDENT_AMBULATORY_CARE_PROVIDER_SITE_OTHER): Payer: Self-pay | Admitting: Dermatology

## 2022-10-07 DIAGNOSIS — L988 Other specified disorders of the skin and subcutaneous tissue: Secondary | ICD-10-CM

## 2022-10-07 NOTE — Progress Notes (Signed)
   Follow-Up Visit   Subjective  PAT S Cotler is a 77 y.o. female who presents for the following: Botox follow up   The following portions of the chart were reviewed this encounter and updated as appropriate: medications, allergies, medical history  Review of Systems:  No other skin or systemic complaints except as noted in HPI or Assessment and Plan.  Objective  Well appearing patient in no apparent distress; mood and affect are within normal limits.  A focused examination was performed of the following areas: face   Relevant exam findings are noted in the Assessment and Plan.   Assessment & Plan   FACIAL ELASTOSIS Exam: Rhytides and volume loss.  Treatment Plan:  Recommend daily broad spectrum sunscreen SPF 30+ to sun-exposed areas, reapply every 2 hours as needed. Call for new or changing lesions.  Staying in the shade or wearing long sleeves, sun glasses (UVA+UVB protection) and wide brim hats (4-inch brim around the entire circumference of the hat) are also recommended for sun protection.   Good result with Botox injections. Consider adding an additional 2 units to each crow's feet at next Botox injection.   Patient concerned about upper eyelid and lower eyelids. Discussed blepharoplasty with a Psychiatric nurse. Will contact patient on MyChart with physician we recommend. As she has no lowering of the brow with squinting, I do not think she will get appreciable lift with botox.               Return for appointment as scheduled.    Documentation: I have reviewed the above documentation for accuracy and completeness, and I agree with the above.  Forest Gleason, MD

## 2022-10-07 NOTE — Patient Instructions (Signed)
Due to recent changes in healthcare laws, you may see results of your pathology and/or laboratory studies on MyChart before the doctors have had a chance to review them. We understand that in some cases there may be results that are confusing or concerning to you. Please understand that not all results are received at the same time and often the doctors may need to interpret multiple results in order to provide you with the best plan of care or course of treatment. Therefore, we ask that you please give us 2 business days to thoroughly review all your results before contacting the office for clarification. Should we see a critical lab result, you will be contacted sooner.   If You Need Anything After Your Visit  If you have any questions or concerns for your doctor, please call our main line at [phone] and press option 4 to reach your doctor's medical assistant. If no one answers, please leave a voicemail as directed and we will return your call as soon as possible. Messages left after 4 pm will be answered the following business day.   You may also send us a message via MyChart. We typically respond to MyChart messages within 1-2 business days.  For prescription refills, please ask your pharmacy to contact our office. Our fax number is [phone].  If you have an urgent issue when the clinic is closed that cannot wait until the next business day, you can page your doctor at the number below.    Please note that while we do our best to be available for urgent issues outside of office hours, we are not available 24/7.   If you have an urgent issue and are unable to reach us, you may choose to seek medical care at your doctor's office, retail clinic, urgent care center, or emergency room.  If you have a medical emergency, please immediately call 911 or go to the emergency department.  Pager Numbers  - Dr. Kowalski: [phone]  - Dr. Moye: [phone]  - Dr. Stewart:  [phone]  In the event of inclement weather, please call our main line at [phone] for an update on the status of any delays or closures.  Dermatology Medication Tips: Please keep the boxes that topical medications come in in order to help keep track of the instructions about where and how to use these. Pharmacies typically print the medication instructions only on the boxes and not directly on the medication tubes.   If your medication is too expensive, please contact our office at [phone] option 4 or send us a message through MyChart.   We are unable to tell what your co-pay for medications will be in advance as this is different depending on your insurance coverage. However, we may be able to find a substitute medication at lower cost or fill out paperwork to get insurance to cover a needed medication.   If a prior authorization is required to get your medication covered by your insurance company, please allow us 1-2 business days to complete this process.  Drug prices often vary depending on where the prescription is filled and some pharmacies may offer cheaper prices.  The website www.goodrx.com contains coupons for medications through different pharmacies. The prices here do not account for what the cost may be with help from insurance (it may be cheaper with your insurance), but the website can give you the price if you did not use any insurance.  - You can print the associated coupon and take it with   your prescription to the pharmacy.  - You may also stop by our office during regular business hours and pick up a GoodRx coupon card.  - If you need your prescription sent electronically to a different pharmacy, notify our office through [facility] MyChart or by phone at [phone] option 4.     Si Usted Necesita Algo Despus de Su Visita  Tambin puede enviarnos un mensaje a travs de MyChart. Por lo general respondemos a los mensajes de MyChart en el transcurso de 1 a 2  das hbiles.  Para renovar recetas, por favor pida a su farmacia que se ponga en contacto con nuestra oficina. Nuestro nmero de fax es el [phone].  Si tiene un asunto urgente cuando la clnica est cerrada y que no puede esperar hasta el siguiente da hbil, puede llamar/localizar a su doctor(a) al nmero que aparece a continuacin.   Por favor, tenga en cuenta que aunque hacemos todo lo posible para estar disponibles para asuntos urgentes fuera del horario de oficina, no estamos disponibles las 24 horas del da, los 7 das de la semana.   Si tiene un problema urgente y no puede comunicarse con nosotros, puede optar por buscar atencin mdica  en el consultorio de su doctor(a), en una clnica privada, en un centro de atencin urgente o en una sala de emergencias.  Si tiene una emergencia mdica, por favor llame inmediatamente al 911 o vaya a la sala de emergencias.  Nmeros de bper  - Dr. Kowalski: [phone]  - Dra. Moye: [phone]  - Dra. Stewart: [phone]  En caso de inclemencias del tiempo, por favor llame a nuestra lnea principal al [phone] para una actualizacin sobre el estado de cualquier retraso o cierre.  Consejos para la medicacin en dermatologa: Por favor, guarde las cajas en las que vienen los medicamentos de uso tpico para ayudarle a seguir las instrucciones sobre dnde y cmo usarlos. Las farmacias generalmente imprimen las instrucciones del medicamento slo en las cajas y no directamente en los tubos del medicamento.   Si su medicamento es muy caro, por favor, pngase en contacto con nuestra oficina llamando al [phone] y presione la opcin 4 o envenos un mensaje a travs de MyChart.   No podemos decirle cul ser su copago por los medicamentos por adelantado ya que esto es diferente dependiendo de la cobertura de su seguro. Sin embargo, es posible que podamos encontrar un medicamento sustituto a menor costo o llenar un formulario para que el  seguro cubra el medicamento que se considera necesario.   Si se requiere una autorizacin previa para que su compaa de seguros cubra su medicamento, por favor permtanos de 1 a 2 das hbiles para completar este proceso.  Los precios de los medicamentos varan con frecuencia dependiendo del lugar de dnde se surte la receta y alguna farmacias pueden ofrecer precios ms baratos.  El sitio web www.goodrx.com tiene cupones para medicamentos de diferentes farmacias. Los precios aqu no tienen en cuenta lo que podra costar con la ayuda del seguro (puede ser ms barato con su seguro), pero el sitio web puede darle el precio si no utiliz ningn seguro.  - Puede imprimir el cupn correspondiente y llevarlo con su receta a la farmacia.  - Tambin puede pasar por nuestra oficina durante el horario de atencin regular y recoger una tarjeta de cupones de GoodRx.  - Si necesita que su receta se enve electrnicamente a una farmacia diferente, informe a nuestra oficina a travs de MyChart de [facility]   o por telfono llamando al [phone] y presione la opcin 4.  

## 2022-11-01 ENCOUNTER — Other Ambulatory Visit: Payer: Self-pay

## 2022-11-01 ENCOUNTER — Emergency Department (HOSPITAL_BASED_OUTPATIENT_CLINIC_OR_DEPARTMENT_OTHER)
Admission: EM | Admit: 2022-11-01 | Discharge: 2022-11-01 | Disposition: A | Discharge status: Other Acute Inpatient Hospital | Payer: BC Managed Care – PPO | Attending: Emergency Medicine | Admitting: Emergency Medicine

## 2022-11-01 ENCOUNTER — Emergency Department (HOSPITAL_BASED_OUTPATIENT_CLINIC_OR_DEPARTMENT_OTHER): Payer: BC Managed Care – PPO | Admitting: Radiology

## 2022-11-01 ENCOUNTER — Emergency Department (HOSPITAL_BASED_OUTPATIENT_CLINIC_OR_DEPARTMENT_OTHER): Payer: BC Managed Care – PPO

## 2022-11-01 ENCOUNTER — Encounter (HOSPITAL_BASED_OUTPATIENT_CLINIC_OR_DEPARTMENT_OTHER): Payer: Self-pay

## 2022-11-01 DIAGNOSIS — K311 Adult hypertrophic pyloric stenosis: Secondary | ICD-10-CM | POA: Diagnosis not present

## 2022-11-01 DIAGNOSIS — E860 Dehydration: Secondary | ICD-10-CM | POA: Diagnosis not present

## 2022-11-01 DIAGNOSIS — K449 Diaphragmatic hernia without obstruction or gangrene: Secondary | ICD-10-CM | POA: Diagnosis not present

## 2022-11-01 DIAGNOSIS — Z7984 Long term (current) use of oral hypoglycemic drugs: Secondary | ICD-10-CM | POA: Insufficient documentation

## 2022-11-01 DIAGNOSIS — Z79899 Other long term (current) drug therapy: Secondary | ICD-10-CM | POA: Diagnosis not present

## 2022-11-01 DIAGNOSIS — I10 Essential (primary) hypertension: Secondary | ICD-10-CM | POA: Insufficient documentation

## 2022-11-01 DIAGNOSIS — E119 Type 2 diabetes mellitus without complications: Secondary | ICD-10-CM | POA: Insufficient documentation

## 2022-11-01 DIAGNOSIS — E039 Hypothyroidism, unspecified: Secondary | ICD-10-CM | POA: Insufficient documentation

## 2022-11-01 DIAGNOSIS — R112 Nausea with vomiting, unspecified: Secondary | ICD-10-CM | POA: Diagnosis present

## 2022-11-01 LAB — CBC
HCT: 45 % (ref 36.0–46.0)
Hemoglobin: 14.4 g/dL (ref 12.0–15.0)
MCH: 28.1 pg (ref 26.0–34.0)
MCHC: 32 g/dL (ref 30.0–36.0)
MCV: 87.7 fL (ref 80.0–100.0)
Platelets: 362 10*3/uL (ref 150–400)
RBC: 5.13 MIL/uL — ABNORMAL HIGH (ref 3.87–5.11)
RDW: 14 % (ref 11.5–15.5)
WBC: 13.6 10*3/uL — ABNORMAL HIGH (ref 4.0–10.5)
nRBC: 0 % (ref 0.0–0.2)

## 2022-11-01 LAB — I-STAT VENOUS BLOOD GAS, ED
Acid-Base Excess: 3 mmol/L — ABNORMAL HIGH (ref 0.0–2.0)
Bicarbonate: 29.3 mmol/L — ABNORMAL HIGH (ref 20.0–28.0)
Calcium, Ion: 1.21 mmol/L (ref 1.15–1.40)
HCT: 42 % (ref 36.0–46.0)
Hemoglobin: 14.3 g/dL (ref 12.0–15.0)
O2 Saturation: 55 %
Patient temperature: 98.1
Potassium: 3.8 mmol/L (ref 3.5–5.1)
Sodium: 138 mmol/L (ref 135–145)
TCO2: 31 mmol/L (ref 22–32)
pCO2, Ven: 47.8 mmHg (ref 44–60)
pH, Ven: 7.394 (ref 7.25–7.43)
pO2, Ven: 29 mmHg — CL (ref 32–45)

## 2022-11-01 LAB — URINALYSIS, ROUTINE W REFLEX MICROSCOPIC
Bacteria, UA: NONE SEEN
Bilirubin Urine: NEGATIVE
Glucose, UA: >1000 mg/dL — AB
Ketones, ur: 40 mg/dL — AB
Leukocytes,Ua: NEGATIVE
Nitrite: NEGATIVE
Protein, ur: 100 mg/dL — AB
Specific Gravity, Urine: >1.046 — ABNORMAL HIGH (ref 1.005–1.030)
pH: 6 (ref 5.0–8.0)

## 2022-11-01 LAB — TROPONIN I (HIGH SENSITIVITY)
Troponin I (High Sensitivity): 5 ng/L (ref <18)
Troponin I (High Sensitivity): 5 ng/L (ref <18)

## 2022-11-01 LAB — COMPREHENSIVE METABOLIC PANEL
ALT: 10 U/L (ref 0–44)
AST: 11 U/L — ABNORMAL LOW (ref 15–41)
Albumin: 4.9 g/dL (ref 3.5–5.0)
Alkaline Phosphatase: 50 U/L (ref 38–126)
Anion gap: 14 (ref 5–15)
BUN: 33 mg/dL — ABNORMAL HIGH (ref 8–23)
CO2: 28 mmol/L (ref 22–32)
Calcium: 9.9 mg/dL (ref 8.9–10.3)
Chloride: 97 mmol/L — ABNORMAL LOW (ref 98–111)
Creatinine, Ser: 1.13 mg/dL — ABNORMAL HIGH (ref 0.44–1.00)
GFR, Estimated: 50 mL/min — ABNORMAL LOW (ref >60)
Glucose, Bld: 212 mg/dL — ABNORMAL HIGH (ref 70–99)
Potassium: 3.8 mmol/L (ref 3.5–5.1)
Sodium: 139 mmol/L (ref 135–145)
Total Bilirubin: 0.6 mg/dL (ref 0.3–1.2)
Total Protein: 7.9 g/dL (ref 6.5–8.1)

## 2022-11-01 LAB — LIPASE, BLOOD: Lipase: <10 U/L — ABNORMAL LOW (ref 11–51)

## 2022-11-01 MED ORDER — DIPHENHYDRAMINE HCL 50 MG/ML IJ SOLN
25.0000 mg | Freq: Once | INTRAMUSCULAR | Status: AC
  Administered 2022-11-01: 25 mg via INTRAVENOUS
  Filled 2022-11-01: qty 1

## 2022-11-01 MED ORDER — LIDOCAINE VISCOUS HCL 2 % MT SOLN
15.0000 mL | Freq: Once | OROMUCOSAL | Status: DC
  Filled 2022-11-01: qty 15

## 2022-11-01 MED ORDER — MORPHINE SULFATE (PF) 4 MG/ML IV SOLN
4.0000 mg | Freq: Once | INTRAVENOUS | Status: AC
  Administered 2022-11-01: 4 mg via INTRAVENOUS
  Filled 2022-11-01: qty 1

## 2022-11-01 MED ORDER — IOHEXOL 300 MG/ML  SOLN
100.0000 mL | Freq: Once | INTRAMUSCULAR | Status: AC | PRN
  Administered 2022-11-01: 80 mL via INTRAVENOUS

## 2022-11-01 MED ORDER — LACTATED RINGERS IV BOLUS
1000.0000 mL | Freq: Once | INTRAVENOUS | Status: AC
  Administered 2022-11-01: 1000 mL via INTRAVENOUS

## 2022-11-01 MED ORDER — ONDANSETRON HCL 4 MG/2ML IJ SOLN
4.0000 mg | Freq: Once | INTRAMUSCULAR | Status: AC
  Administered 2022-11-01: 4 mg via INTRAVENOUS
  Filled 2022-11-01: qty 2

## 2022-11-01 NOTE — ED Notes (Signed)
Patient transported to CT 

## 2022-11-01 NOTE — ED Notes (Signed)
Spoke with lab to add on trop

## 2022-11-01 NOTE — ED Triage Notes (Signed)
In for eval of constant nausea x3 days and vomiting or spitting up every time she drinks or eats anything. Fundoplication 5.5 months ago. Chest pain and epigastric pain intermittently. Pain worse with carbonated drink. Denies diarrhea.

## 2022-11-01 NOTE — ED Notes (Signed)
Per pt placement, pt to be transported to [facility] bed 1610, Veleta Miners MD accepting

## 2022-11-01 NOTE — ED Notes (Signed)
She tolerated placement of a #16 N.G. tube via right nare. Returns cloudy light drainage. It returns 500 ml immediately. Pt. And her husband are aware of plan to transport to [facility] (that is where she underwent Nissen fundoplication less than a year ago).

## 2022-11-01 NOTE — ED Provider Notes (Signed)
[place] [facility] AT [facility] Provider Note   CSN: [id number] Arrival date & time: 11/01/22  1028     History  Chief Complaint  Patient presents with   Nausea    Rebecca Kramer is a 77 y.o. female with PMH type 2 diabetes, GERD, hypertension, hypothyroidism, hyperlipidemia, previous fundoplication in November 2023 without complication who presents to the ED complaining of nausea and vomiting for the last 4 days.  States that every time she tries to eat or drink she becomes nauseated and vomits.  No hematemesis.  No associated diarrhea, chest pain, fever, shortness of breath, urinary symptoms, or back pain. Associated cramping to epigastric and LUQ of abdomen. No known sick contacts. No new medications. No history of ACS. No alcohol use.       Home Medications Prior to Admission medications   Medication Sig Start Date End Date Taking? Authorizing Provider  amLODipine (NORVASC) 5 MG tablet  11/17/18   [provider]  calcium-vitamin D (OSCAL WITH D) 500-200 MG-UNIT tablet Take 1 tablet by mouth.    [provider]  clobetasol cream (TEMOVATE) 0.05 % Apply twice daily to rash until clear. Avoid applying to face, groin, breast folds and axilla. 07/02/22   Willeen Niece, MD  dapagliflozin propanediol (FARXIGA) 5 MG TABS tablet Take 1 tablet (5 mg total) by mouth daily before breakfast. 03/04/22   Carlus Pavlov, MD  dutasteride (AVODART) 0.5 MG capsule Take 1 capsule (0.5 mg total) by mouth daily. 09/28/22   Moye, [place], MD  Fluocinolone Acetonide 0.01 % OIL Apply 3 drops to affected ears twice daily as needed 07/02/22   Willeen Niece, MD  levothyroxine (SYNTHROID) 100 MCG tablet TAKE 1 TABLET DAILY BEFORE BREAKFAST 09/07/22   Carlus Pavlov, MD  losartan (COZAAR) 50 MG tablet Take 1 tablet (50 mg total) by mouth daily. Patient taking differently: Take 50 mg by mouth 2 times daily at 12 noon and 4 pm. 05/18/16   Tower, Audrie Gallus, MD   metFORMIN (GLUCOPHAGE-XR) 500 MG 24 hr tablet TAKE 4 TABLETS DAILY WITH SUPPER 10/29/21   Carlus Pavlov, MD  Multiple Vitamin (MULTIVITAMIN) tablet Take 1 tablet by mouth daily.    [provider]  pantoprazole (PROTONIX) 40 MG tablet TAKE 1 TABLET ONCE DAILY.  TAKE 30 TO 45 MINUTES BEFORE BREAKFAST 06/01/18   [provider]  predniSONE (DELTASONE) 5 MG tablet Take prednisone by mouth daily in morning with food starting at 60 mg dosage and gradually decreasing daily dose as directed until gone for a three week taper. Patient was given written instructions. Potential side effects reviewed. 07/07/22   Willeen Niece, MD      Allergies    Benzonatate and Dilaudid [hydromorphone hcl]    Review of Systems   Review of Systems  All other systems reviewed and are negative.   Physical Exam Updated Vital Signs BP (!) 156/87 (BP Location: Right Arm)   Pulse 77   Temp 98.3 F (36.8 C) (Oral)   Resp 16   Ht 5\' 6"  (1.676 m)   Wt 77.1 kg   SpO2 96%   BMI 27.44 kg/m  Physical Exam Vitals and nursing note reviewed.  Constitutional:      General: She is not in acute distress.    Appearance: Normal appearance. She is not ill-appearing, toxic-appearing or diaphoretic.  HENT:     Head: Normocephalic and atraumatic.     Mouth/Throat:     Mouth: Mucous membranes are dry.  Eyes:  General: No scleral icterus.    Extraocular Movements: Extraocular movements intact.     Conjunctiva/sclera: Conjunctivae normal.     Pupils: Pupils are equal, round, and reactive to light.  Cardiovascular:     Rate and Rhythm: Normal rate and regular rhythm.     Heart sounds: No murmur heard. Pulmonary:     Effort: Pulmonary effort is normal. No respiratory distress.     Breath sounds: Normal breath sounds. No stridor. No wheezing, rhonchi or rales.  Abdominal:     General: Abdomen is flat. There is no distension.     Palpations: Abdomen is soft. There is no mass.     Tenderness: There is  abdominal tenderness (mild epigastric, LUQ). There is no right CVA tenderness, left CVA tenderness, guarding or rebound.  Musculoskeletal:        General: Normal range of motion.     Cervical back: Normal range of motion and neck supple.     Right lower leg: No edema.     Left lower leg: No edema.  Skin:    General: Skin is warm and dry.     Capillary Refill: Capillary refill takes 2 to 3 seconds.     Coloration: Skin is not jaundiced or pale.     Findings: No rash.  Neurological:     Mental Status: She is alert. Mental status is at baseline.  Psychiatric:        Mood and Affect: Mood normal.        Behavior: Behavior normal.     ED Results / Procedures / Treatments   Labs (all labs ordered are listed, but only abnormal results are displayed) Labs Reviewed  LIPASE, BLOOD - Abnormal; Notable for the following components:      Result Value   Lipase <10 (*)    All other components within normal limits  COMPREHENSIVE METABOLIC PANEL - Abnormal; Notable for the following components:   Chloride 97 (*)    Glucose, Bld 212 (*)    BUN 33 (*)    Creatinine, Ser 1.13 (*)    AST 11 (*)    GFR, Estimated 50 (*)    All other components within normal limits  CBC - Abnormal; Notable for the following components:   WBC 13.6 (*)    RBC 5.13 (*)    All other components within normal limits  URINALYSIS, ROUTINE W REFLEX MICROSCOPIC - Abnormal; Notable for the following components:   Specific Gravity, Urine >1.046 (*)    Glucose, UA >1,000 (*)    Hgb urine dipstick SMALL (*)    Ketones, ur 40 (*)    Protein, ur 100 (*)    All other components within normal limits  I-STAT VENOUS BLOOD GAS, ED - Abnormal; Notable for the following components:   pO2, Ven 29 (*)    Bicarbonate 29.3 (*)    Acid-Base Excess 3.0 (*)    All other components within normal limits  TROPONIN I (HIGH SENSITIVITY)  TROPONIN I (HIGH SENSITIVITY)    EKG EKG Interpretation  Date/Time:  Sunday November 01 2022  11:14:56 EDT Ventricular Rate:  93 PR Interval:  130 QRS Duration: 78 QT Interval:  408 QTC Calculation: 507 R Axis:   -58 Text Interpretation: Normal sinus rhythm Possible Left atrial enlargement Left axis deviation Possible Anterior infarct , age undetermined Abnormal ECG No significant change since last tracing Confirmed by Alvira Monday ([id number]) on 11/01/2022 3:13:35 PM  Radiology DG Chest 2 View  Result Date: 11/01/2022 CLINICAL  DATA:  Nissen fundoplication 5.5 months ago. EXAM: CHEST - 2 VIEW COMPARISON:  CT scan of the abdomen November 01, 2022 FINDINGS: Air-fluid levels in the lower chest both to the right and left of midline are consistent with the large hiatal hernia better assessed on CT imaging. On the CT scan, the portion of stomach above the diaphragm contains fluid and air. There is decompression of the stomach below the diaphragm. Small right pleural effusion. The heart, hila, mediastinum, lungs, and pleura are otherwise unremarkable. IMPRESSION: 1. Large hiatal hernia better assessed on CT imaging. Given the air in fluid in the distended stomach above the diaphragm and the decompressed stomach below the diaphragm, a gastric outlet obstruction is suspected. 2. Small right pleural effusion. Electronically Signed   By: Gerome Sam III M.D.   On: 11/01/2022 16:11   CT ABDOMEN PELVIS W CONTRAST  Result Date: 11/01/2022 CLINICAL DATA:  Abdominal pain. EXAM: CT ABDOMEN AND PELVIS WITH CONTRAST TECHNIQUE: Multidetector CT imaging of the abdomen and pelvis was performed using the standard protocol following bolus administration of intravenous contrast. RADIATION DOSE REDUCTION: This exam was performed according to the departmental dose-optimization program which includes automated exposure control, adjustment of the mA and/or kV according to patient size and/or use of iterative reconstruction technique. CONTRAST:  80mL OMNIPAQUE IOHEXOL 300 MG/ML  SOLN COMPARISON:  Nov 03, 2021 FINDINGS:  Lower chest: Enlarged hiatal hernia which contains most of the stomach. Hepatobiliary: No focal liver abnormality is seen. No gallstones, gallbladder wall thickening, or biliary dilatation. Pancreas: Unremarkable. No pancreatic ductal dilatation or surrounding inflammatory changes. Spleen: Normal in size without focal abnormality. Adrenals/Urinary Tract: Normal adrenal glands. Normal left kidney. 9 mm indeterminate mass in the outer cortex of the right kidney. Normal urinary bladder. Stomach/Bowel: Stomach is within normal limits. No evidence of appendicitis. No evidence of bowel wall thickening, distention, or inflammatory changes. Vascular/Lymphatic: Aortic atherosclerosis. No enlarged abdominal or pelvic lymph nodes. Reproductive: Status post hysterectomy. No adnexal masses. Other: No abdominal wall hernia or abnormality. No abdominopelvic ascites. Musculoskeletal: No acute or significant osseous findings. IMPRESSION: 1. Enlarged hiatal hernia which contains most of the stomach. 2. 9 mm indeterminate mass in the outer cortex of the right kidney, stable from May 2023. Follow-up with ultrasound may be considered. 3. Aortic atherosclerosis. Aortic Atherosclerosis (ICD10-I70.0). Electronically Signed   By: Ted Mcalpine M.D.   On: 11/01/2022 16:02    Procedures Procedures    Medications Ordered in ED Medications  lidocaine (XYLOCAINE) 2 % viscous mouth solution 15 mL (has no administration in time range)  lactated ringers bolus 1,000 mL (1,000 mLs Intravenous New Bag/Given 11/01/22 1510)  ondansetron (ZOFRAN) injection 4 mg (4 mg Intravenous Given 11/01/22 1510)  morphine (PF) 4 MG/ML injection 4 mg (4 mg Intravenous Given 11/01/22 1510)  diphenhydrAMINE (BENADRYL) injection 25 mg (25 mg Intravenous Given 11/01/22 1510)  iohexol (OMNIPAQUE) 300 MG/ML solution 100 mL (80 mLs Intravenous Contrast Given 11/01/22 1553)    ED Course/ Medical Decision Making/ A&P                             Medical  Decision Making Amount and/or Complexity of Data Reviewed Labs: ordered. Decision-making details documented in ED Course. Radiology: ordered. Decision-making details documented in ED Course. ECG/medicine tests: ordered. Decision-making details documented in ED Course.  Risk Prescription drug management.   Medical Decision Making:   MONTGOMERY ROTHLISBERGER is a 77 y.o. female who presented to  the ED today with abdominal pain detailed above.    Additional history discussed with patient's family/caregivers.  Patient's presentation is complicated by their history of previous fundoplication.  Complete initial physical exam performed, notably the patient  was in no acute distress.  She had mild left upper quadrant epigastric pain.  She appears clinically dehydrated.   No rebound, guarding.  No CVA tenderness.  Neurologically intact. Reviewed and confirmed nursing documentation for past medical history, family history, social history.    Initial Assessment:   With the patient's presentation of abdominal pain, differential diagnosis includes but is not limited to AAA, mesenteric ischemia, appendicitis, diverticulitis, DKA, gastritis, gastroenteritis, AMI, nephrolithiasis, pancreatitis, peritonitis, adrenal insufficiency, intestinal ischemia, constipation, UTI, SBO/LBO, splenic rupture, biliary disease, IBD, IBS, PUD, hepatitis, STD, ovarian/testicular torsion, electrolyte disturbance, DKA, dehydration, acute kidney injury, renal failure, cholecystitis, cholelithiasis, choledocholithiasis, abdominal pain of  unknown etiology.   Initial Plan:  Screening labs including CBC and Metabolic panel to evaluate for infectious or metabolic etiology of disease.  Lipase to evaluate for pancreatitis Urinalysis with reflex culture ordered to evaluate for UTI or relevant urologic/nephrologic pathology.  CT abd/pelvis to evaluate for intra-abdominal pathology EKG and troponin to evaluate for cardiac  pathology Symptomatic management Objective evaluation as reviewed   Initial Study Results:   Laboratory  All laboratory results reviewed without evidence of clinically relevant pathology.   Exceptions include: Glucose 212, BUN 33, creatinine 1.13, WBC 13.6  EKG EKG was reviewed independently. ST segments without concerns for elevations.   EKG: normal sinus rhythm.   Radiology:  All images reviewed independently. Agree with radiology report at this time.   DG Chest 2 View  Result Date: 11/01/2022 CLINICAL DATA:  Nissen fundoplication 5.5 months ago. EXAM: CHEST - 2 VIEW COMPARISON:  CT scan of the abdomen November 01, 2022 FINDINGS: Air-fluid levels in the lower chest both to the right and left of midline are consistent with the large hiatal hernia better assessed on CT imaging. On the CT scan, the portion of stomach above the diaphragm contains fluid and air. There is decompression of the stomach below the diaphragm. Small right pleural effusion. The heart, hila, mediastinum, lungs, and pleura are otherwise unremarkable. IMPRESSION: 1. Large hiatal hernia better assessed on CT imaging. Given the air in fluid in the distended stomach above the diaphragm and the decompressed stomach below the diaphragm, a gastric outlet obstruction is suspected. 2. Small right pleural effusion. Electronically Signed   By: Gerome Sam III M.D.   On: 11/01/2022 16:11   CT ABDOMEN PELVIS W CONTRAST  Result Date: 11/01/2022 CLINICAL DATA:  Abdominal pain. EXAM: CT ABDOMEN AND PELVIS WITH CONTRAST TECHNIQUE: Multidetector CT imaging of the abdomen and pelvis was performed using the standard protocol following bolus administration of intravenous contrast. RADIATION DOSE REDUCTION: This exam was performed according to the departmental dose-optimization program which includes automated exposure control, adjustment of the mA and/or kV according to patient size and/or use of iterative reconstruction technique. CONTRAST:   80mL OMNIPAQUE IOHEXOL 300 MG/ML  SOLN COMPARISON:  Nov 03, 2021 FINDINGS: Lower chest: Enlarged hiatal hernia which contains most of the stomach. Hepatobiliary: No focal liver abnormality is seen. No gallstones, gallbladder wall thickening, or biliary dilatation. Pancreas: Unremarkable. No pancreatic ductal dilatation or surrounding inflammatory changes. Spleen: Normal in size without focal abnormality. Adrenals/Urinary Tract: Normal adrenal glands. Normal left kidney. 9 mm indeterminate mass in the outer cortex of the right kidney. Normal urinary bladder. Stomach/Bowel: Stomach is within normal limits. No evidence  of appendicitis. No evidence of bowel wall thickening, distention, or inflammatory changes. Vascular/Lymphatic: Aortic atherosclerosis. No enlarged abdominal or pelvic lymph nodes. Reproductive: Status post hysterectomy. No adnexal masses. Other: No abdominal wall hernia or abnormality. No abdominopelvic ascites. Musculoskeletal: No acute or significant osseous findings. IMPRESSION: 1. Enlarged hiatal hernia which contains most of the stomach. 2. 9 mm indeterminate mass in the outer cortex of the right kidney, stable from May 2023. Follow-up with ultrasound may be considered. 3. Aortic atherosclerosis. Aortic Atherosclerosis (ICD10-I70.0). Electronically Signed   By: Ted Mcalpine M.D.   On: 11/01/2022 16:02   MM 3D SCREEN BREAST W/IMPLANT BILATERAL  Result Date: 10/07/2022 CLINICAL DATA:  Screening. EXAM: DIGITAL SCREENING BILATERAL MAMMOGRAM WITH IMPLANTS, CAD AND TOMOSYNTHESIS TECHNIQUE: Bilateral screening digital craniocaudal and mediolateral oblique mammograms were obtained. Bilateral screening digital breast tomosynthesis was performed. The images were evaluated with computer-aided detection. Standard and/or implant displaced views were performed. COMPARISON:  Previous exam(s). ACR Breast Density Category b: There are scattered areas of fibroglandular density. FINDINGS: The patient has  retropectoral implants. There are no findings suspicious for malignancy. IMPRESSION: No mammographic evidence of malignancy. A result letter of this screening mammogram will be mailed directly to the patient. RECOMMENDATION: Screening mammogram in one year. (Code:[id number]-B-01Y) BI-RADS CATEGORY  1: Negative. Electronically Signed   By: Baird Lyons M.D.   On: 10/07/2022 11:00      Consults: Case discussed with Dr. Minus Breeding with surgery who recommended NG tube and transfer to [facility].  Case discussed with [facility] transfer team at 1700 and they will present case to team.  Case discussed with Dr. Julien Nordmann, bariatric surgeon on call at [facility], accepts patient for transfer to [facility].  Final Assessment and Plan:   77 year old female presents to the ED complaining of multiple days of nausea, vomiting, and upper abdominal pain.  History of previous fundoplication at [facility] in November 2023.  No immediate complications following the surgery.  Inability to tolerate p.o. since onset of symptoms this past week.  On exam, she has mild tenderness to the epigastric region and left upper quadrant. She did appear dehydrated.  No CVA tenderness.  Symptoms are inconsistent with ACS but did obtain cardiac enzymes to rule this out in the setting of epigastric pain.  Troponin negative x 2.  No acute EKG changes.  Further workup as above for evaluation of patient's symptoms.  No significant electrolyte disturbance.  Creatinine at 1.13 just minimally elevated from previous.  Unremarkable LFTs.  Minimal leukocytosis at 13.6.  Chest x-ray showing a hiatal hernia with likely gastric outlet obstruction.  Contacted general surgery on-call to discuss findings.  These findings were confirmed on CT abdomen pelvis.  Surgeon recommended NG tube and transfer to [facility] where patient's surgery was performed.  Contacted bariatric surgeon on-call at [facility] as above and discussed case.  He reviewed images and they will accept  patient as transfer to their facility.  NG tube placed without complication.  Pending films to confirm placement.  Patient's pain well-managed without active vomiting following initial dose of medications given in the ED.  Patient has remained stable throughout ED stay.  Pending transport but stable at time of disposition and will remain monitored until EMS transfer.    Clinical Impression:  1. Gastric outlet obstruction   2. Dehydration   3. Hiatal hernia      Transfer via Transport           Final Clinical Impression(s) / ED Diagnoses Final diagnoses:  Dehydration  Gastric outlet obstruction  Hiatal hernia    Rx / DC Orders ED Discharge Orders     None         Tonette Lederer, PA-C 11/01/22 1905    Virgina Norfolk, DO 11/01/22 2016

## 2022-11-01 NOTE — ED Notes (Signed)
[facility]; spoke to receptionist; forwarded the call to Ysidro Evert, PA, for possible pt tx

## 2022-11-01 NOTE — ED Notes (Signed)
ED Provider at bedside. 

## 2022-11-01 NOTE — ED Notes (Signed)
Called [facility] tx; spoke to Meggett; she informed me that once a bed assignment is ready for pt, they will call back and give [place] the information for tx.

## 2022-11-01 NOTE — ED Notes (Signed)
[facility] at bedside to transport pt to [facility]

## 2022-11-03 ENCOUNTER — Encounter: Payer: Self-pay | Admitting: Dermatology

## 2022-11-15 ENCOUNTER — Encounter: Payer: Self-pay | Admitting: Dermatology

## 2022-12-04 ENCOUNTER — Telehealth: Payer: Self-pay

## 2022-12-04 NOTE — Telephone Encounter (Signed)
Pt called to advise recent labs show her thyroid levels are low. Pt wants to know if she needs to make any adjustments to her current dose.

## 2022-12-07 ENCOUNTER — Other Ambulatory Visit: Payer: Self-pay | Admitting: "Endocrinology

## 2022-12-07 MED ORDER — LEVOTHYROXINE SODIUM 88 MCG PO TABS
88.0000 ug | ORAL_TABLET | Freq: Every day | ORAL | 0 refills | Status: DC
Start: 2022-12-07 — End: 2023-01-06

## 2022-12-07 NOTE — Telephone Encounter (Signed)
LMTRC  JMiller,RMA 

## 2022-12-07 NOTE — Telephone Encounter (Signed)
I called spoke with the patient she states that her PCP advised her to take 1/2 tablet of her Levothyroxine 100 mcg dosage (50 mcg daily). The patient would like to know how does she need to proceed because [facility] will send her a 90 day supply of the 88 mcg and if she does not need it she will have a lot of medication left over. Please advise,

## 2022-12-09 NOTE — Telephone Encounter (Signed)
Pt has been notified and voices understanding.  

## 2023-01-01 ENCOUNTER — Encounter: Payer: Self-pay | Admitting: Internal Medicine

## 2023-01-01 ENCOUNTER — Ambulatory Visit: Payer: BC Managed Care – PPO | Admitting: Internal Medicine

## 2023-01-01 VITALS — BP 122/84 | HR 79 | Ht 66.0 in | Wt 164.4 lb

## 2023-01-01 DIAGNOSIS — E038 Other specified hypothyroidism: Secondary | ICD-10-CM | POA: Diagnosis not present

## 2023-01-01 DIAGNOSIS — E063 Autoimmune thyroiditis: Secondary | ICD-10-CM

## 2023-01-01 DIAGNOSIS — E119 Type 2 diabetes mellitus without complications: Secondary | ICD-10-CM

## 2023-01-01 DIAGNOSIS — Z7984 Long term (current) use of oral hypoglycemic drugs: Secondary | ICD-10-CM

## 2023-01-01 LAB — TSH: TSH: 24.64 u[IU]/mL — ABNORMAL HIGH (ref 0.35–5.50)

## 2023-01-01 LAB — T4, FREE: Free T4: 0.58 ng/dL — ABNORMAL LOW (ref 0.60–1.60)

## 2023-01-01 NOTE — Addendum Note (Signed)
Addended by: Carlus Pavlov on: 01/01/2023 04:39 PM   Modules accepted: Orders

## 2023-01-01 NOTE — Patient Instructions (Addendum)
Please continue: - Metformin 1000 mg with dinner  Please continue Synthroid 50 mcg daily but we may need to increase to 88 mcg daily  Take the thyroid hormone every day, with water, at least 30 minutes before breakfast, separated by at least 4 hours from: - acid reflux medications - calcium - iron - multivitamins  Please come back for a follow-up appointment in 4 months.

## 2023-01-01 NOTE — Progress Notes (Addendum)
Patient ID: VILA LARR, female   DOB: Jul 21, 1945, 77 y.o.   MRN: [id number]   HPI  FREADA ALSTON is a 77 y.o.-year-old female, initially referred by her PCP, Dr.Tower, presenting for f/u for Hashimoto hypothyroidism and DM2, non-insulin-dependent, without long-term complications. She was seeing Dr Alessandra Bevels [facility]) >> stopped seeing her 2/2 cost.  Last visit 6 months ago.  Interim history: No increased urination, blurry vision, nausea, chest pain. She had acid reflux before and was on PPIs.  She had fundoplication 05/2022 and she changed her diet afterwards.  She came off PPIs.  However, since last visit, she was admitted 11/01/2022 with gastric outlet obstruction, dehydration, hiatal hernia. At that time, she developed nausea with food, then water. She had to have repeated surgery. Symptoms are much better. Now fork cuttable diet.  Hypothyroidism: - dx'ed "many years ago"  She tried: -Generic levothyroxine -Brand names -Then, Naturethroid 81.5 mg + Cytomel 5 mg bid (equivalent to ~180 mcg LT4)  -then, generic levothyroxine -Changed to brand name Synthroid 11/2019 >> but generic LT4 since 08/2020 per insurance preference.  She takes LT4 50 mcg daily(decreased to 50 mcg daily by PCP 1 mo ago): - in am - fasting - at least 30 min from b'fast - + calcium within >4 hours from levothyroxine - no iron - + multivitamins >4 hours of levothyroxine -  stopped  - not on Biotin   Reviewed TFTs: 12/02/2022: TSH 0.229 08/27/2022: TSH 1.533 Lab Results  Component Value Date   TSH 1.37 04/14/2022   TSH 1.68 07/21/2018   TSH 1.24 08/19/2017   TSH 1.52 04/28/2016   TSH 2.02 01/16/2016   TSH 1.97 12/05/2015   TSH 1.27 11/19/2015   TSH 0.43 01/10/2015   TSH 1.12 11/26/2014   TSH 0.79 07/23/2014   FREET4 1.13 04/14/2022   FREET4 1.02 07/21/2018   FREET4 0.99 08/19/2017   FREET4 0.88 04/28/2016   FREET4 0.86 01/16/2016   FREET4 1.02 11/19/2015   FREET4 1.33 01/10/2015    FREET4 1.23 07/23/2014   FREET4 1.02 05/08/2014   FREET4 1.4 10/08/2008  11/24/2021: TSH 0.861 05/27/2021: TSH 2.251 11/20/2020: TSH 1.615 06/03/2020: TSH 1.554 08/18/2019: TSH 0.951 11/15/2018: 1.086 05/05/2017: TSH 1.233 total T4 9.4 11/02/2016: TSH 0.435, total T4 11.6  03/14/2014: ATA 313 (<2) TPO Abs 39 (<9) TSH 3.552 (0.35-4.5), free T4 0.82 (0.8-1.8), Free T3 2.6 (2.3-4.2), reverse T3 12 (8-25) At the same time, she hadn't hemoglobin A1c 6.3, fasting insulin 16.6, vitamin D 66  Pt denies: - feeling nodules in neck - hoarseness - dysphagia - choking  No FH of thyroid disease or thyroid cancer. No h/o radiation tx to head or neck. No herbal supplements. No Biotin use. No recent po steroids use - but on steroid drops.  She was exercising at [employer] but not during the coronavirus pandemic Husband has prostate cancer.  DM2: -Diagnosed in 2016  Reviewed HbA1c levels: 12/02/2022: HbA1c 6.9% 08/27/2022: HbA1c 7.3% Lab Results  Component Value Date   HGBA1C 6.6 (A) 03/03/2022   HGBA1C 6.7 (A) 09/02/2021   HGBA1C 6.1 (A) 03/03/2021   HGBA1C 6.8 (A) 09/03/2020   HGBA1C 6.3 (A) 04/30/2020   HGBA1C 6.7 (A) 07/25/2019   HGBA1C 6.4 (A) 03/28/2019   HGBA1C 6.5 08/28/2016   HGBA1C 6.4 12/05/2015   HGBA1C 6.6 (H) 08/27/2015  11/15/2018: HbA1c 6.8% 05/17/2018: HbA1c 6.9%  She is on: - Metformin ER 2000 >> 1000 mg with D - Farxiga 5 mg before breakfast >> moved before  dinner >> off for 2 months after her hospitalization (she did not restart)  She checks sugars once a day: - am: 113-145, 151, 161 >> 125-178 >> 111, 126-185, 190 >> 92, 116-143, 153 - 2h after b'fast: n/c >> 131 >> n/c - lunch: 138-184 >> 120, 159 >> n/c >> 128 >> 116 >> 98, 120, 131 - 2h after lunch: 113 >> n/c >> 175-185 >> n/c >> 79, 123 >> n/c - dinner: n/c >> 99-115 >> 115-112 >> 115>> n/c >> 111 - 2h after dinner: 163, 166, 247 >> n/c >> 192, 240 >> n/c >> 134 >> n/c >> 125 - bedtime: 104 >>  n/c Lowest: 79 >> 125 >> 111 >> 92 Highest: 240 >> .Marland Kitchen.[email or web address] >> 153  She and her husband started walking after the holidays-walking 30 to 40 minutes a day. Started back walking.  No CKD: 12/02/2022: 28/0.7, GFR 89, glucose 80 Lab Results  Component Value Date   BUN 33 (H) 11/01/2022   BUN 20 08/28/2016   Lab Results  Component Value Date   CREATININE 1.13 (H) 11/01/2022   CREATININE 1.00 11/03/2021  On losartan.  + HL: 08/27/2022:  Ref Range & Units 7 d ago   Cholesterol, Total 100 - 200 mg/dL 308  Triglyceride 35 - 199 mg/dL 657  HDL (High Density Lipoprotein) Cholesterol 35.0 - 85.0 mg/dL 84.6  LDL Calculated 0 - 130 mg/dL 98  VLDL Cholesterol mg/dL 37  Cholesterol/HDL Ratio  3.8  11/24/2021:  Cholesterol, Total 100 - 200 mg/dL 962 High    Triglyceride 35 - 199 mg/dL 952   HDL (High Density Lipoprotein) Cholesterol 35.0 - 85.0 mg/dL 84.1   LDL Calculated 0 - 130 mg/dL 324   VLDL Cholesterol mg/dL 31   Cholesterol/HDL Ratio  3.9   05/27/2021: 195/152/45.5/119 11/20/2020: 202/123/52.3/125 06/03/2020: 206/127/52.7/128 05/11/2019: 188/248/42.7/96 11/15/2018: 185/144/46.5/106 Lab Results  Component Value Date   CHOL 195 08/28/2016   HDL 51.30 08/28/2016   LDLCALC 118 (H) 08/28/2016   LDLDIRECT 129.6 07/17/2011   TRIG 130.0 08/28/2016   CHOLHDL 4 08/28/2016  Not on a statin.  Last eye exam: 2024: No DR.  She had cataracts >> had surgery 05/2020.    Last foot exam 03/03/2022.  She has sleep apnea - has a CPAP - does not like the machine.  ROS: + see HPI  I reviewed pt's medications, allergies, PMH, social hx, family hx, and changes were documented in the history of present illness. Otherwise, unchanged from my initial visit note.  Past Medical History:  Diagnosis Date   Arthritis    Barrett's esophagus    Bone spur    Left foot / posterior   Diabetes mellitus without complication (HCC)    Diverticulosis    GERD (gastroesophageal reflux disease)    esophagitis  with ulceration   Hypertension    Hypothyroidism    Menopausal symptoms    hot flashes   Osteopenia    Pure hypercholesterolemia    Sleep apnea 2007   AHI 45/hr in 2007   Past Surgical History:  Procedure Laterality Date   ABDOMINAL HYSTERECTOMY  1983   partial/endometriosis   BREAST EXCISIONAL BIOPSY Right    BREAST SURGERY  1970's   breast lumpectomy/benign   BREAST SURGERY  1994   breast implants/saline   CATARACT EXTRACTION     COLONOSCOPY WITH PROPOFOL N/A 04/20/2017   Procedure: COLONOSCOPY WITH PROPOFOL;  Surgeon: Christena Deem, MD;  Location: [facility] ENDOSCOPY;  Service: Endoscopy;  Laterality: N/A;  COLONOSCOPY WITH PROPOFOL N/A 06/17/2017   Procedure: COLONOSCOPY WITH PROPOFOL;  Surgeon: Christena Deem, MD;  Location: [facility] ENDOSCOPY;  Service: Endoscopy;  Laterality: N/A;   COLONOSCOPY WITH PROPOFOL N/A 01/16/2022   Procedure: COLONOSCOPY WITH PROPOFOL;  Surgeon: Regis Bill, MD;  Location: [facility] ENDOSCOPY;  Service: Endoscopy;  Laterality: N/A;  DM   DORSAL COMPARTMENT RELEASE  05/14/2011   Procedure: RELEASE DORSAL COMPARTMENT (DEQUERVAIN);  Surgeon: Wyn Forster., MD;  Location: [facility];  Service: Orthopedics;  Laterality: Right;  right release dorsal compartment/ de quervain release with debridement of mucoid cyst   ESOPHAGOGASTRIC FUNDOPLICATION  05/2022   ESOPHAGOGASTRODUODENOSCOPY  03/2008   esophagitis with ulcerations/?barretts   ESOPHAGOGASTRODUODENOSCOPY (EGD) WITH PROPOFOL N/A 04/20/2017   Procedure: ESOPHAGOGASTRODUODENOSCOPY (EGD) WITH PROPOFOL;  Surgeon: Christena Deem, MD;  Location[facility] ENDOSCOPY;  Service: Endoscopy;  Laterality: N/A;   ESOPHAGOGASTRODUODENOSCOPY (EGD) WITH PROPOFOL  06/17/2017   Procedure: ESOPHAGOGASTRODUODENOSCOPY (EGD) WITH PROPOFOL;  Surgeon: Christena Deem, MD;  Location: [facility] ENDOSCOPY;  Service: Endoscopy;;   ESOPHAGOGASTRODUODENOSCOPY (EGD) WITH PROPOFOL N/A 01/16/2022   Procedure:  ESOPHAGOGASTRODUODENOSCOPY (EGD) WITH PROPOFOL;  Surgeon: Regis Bill, MD;  Location: [facility] ENDOSCOPY;  Service: Endoscopy;  Laterality: N/A;   ESOPHAGOGASTRODUODENOSCOPY (EGD) WITH PROPOFOL N/A 04/24/2022   Procedure: ESOPHAGOGASTRODUODENOSCOPY (EGD) WITH PROPOFOL;  Surgeon: Regis Bill, MD;  Location: [facility] ENDOSCOPY;  Service: Endoscopy;  Laterality: N/A;   EYE SURGERY     thumb nodule     removed   TONSILLECTOMY     History   Social History   Marital Status: Married    Spouse Name: N/A    Number of Children: 2   Occupational History   retired   Social History Main Topics   Smoking status: Never Smoker    Smokeless tobacco: Never Used   Alcohol Use: No   Drug Use: No   Current Outpatient Medications on File Prior to Visit  Medication Sig Dispense Refill   levothyroxine (SYNTHROID) 88 MCG tablet Take 1 tablet (88 mcg total) by mouth daily. 30 tablet 0   amLODipine (NORVASC) 5 MG tablet      calcium-vitamin D (OSCAL WITH D) 500-200 MG-UNIT tablet Take 1 tablet by mouth.     clobetasol cream (TEMOVATE) 0.05 % Apply twice daily to rash until clear. Avoid applying to face, groin, breast folds and axilla. 60 g 1   dapagliflozin propanediol (FARXIGA) 5 MG TABS tablet Take 1 tablet (5 mg total) by mouth daily before breakfast. 7 tablet 0   dutasteride (AVODART) 0.5 MG capsule Take 1 capsule (0.5 mg total) by mouth daily. 30 capsule 5   Fluocinolone Acetonide 0.01 % OIL Apply 3 drops to affected ears twice daily as needed 20 mL 2   losartan (COZAAR) 50 MG tablet Take 1 tablet (50 mg total) by mouth daily. (Patient taking differently: Take 50 mg by mouth 2 times daily at 12 noon and 4 pm.) 90 tablet 1   metFORMIN (GLUCOPHAGE-XR) 500 MG 24 hr tablet TAKE 4 TABLETS DAILY WITH SUPPER 360 tablet 3   Multiple Vitamin (MULTIVITAMIN) tablet Take 1 tablet by mouth daily.     pantoprazole (PROTONIX) 40 MG tablet TAKE 1 TABLET ONCE DAILY.  TAKE 30 TO 45 MINUTES BEFORE BREAKFAST      predniSONE (DELTASONE) 5 MG tablet Take prednisone by mouth daily in morning with food starting at 60 mg dosage and gradually decreasing daily dose as directed until gone for a three week taper. Patient was  given written instructions. Potential side effects reviewed. 150 tablet 0   No current facility-administered medications on file prior to visit.   Allergies  Allergen Reactions   Benzonatate     Hot flash   Dilaudid [Hydromorphone Hcl] Other (See Comments)    Face turned red; bp   Family History  Problem Relation Age of Onset   Hypertension Mother    Hyperlipidemia Mother    Colon polyps Mother    Heart disease Mother    Migraines Mother    Cancer Father        lung CA   Migraines Brother    Cancer Maternal Aunt        breast CA   Breast cancer Maternal Aunt    PE: BP 122/84   Pulse 79   Ht 5\' 6"  (1.676 m)   Wt 164 lb 6.4 oz (74.6 kg)   SpO2 97%   BMI 26.53 kg/m  Wt Readings from Last 3 Encounters:  01/01/23 164 lb 6.4 oz (74.6 kg)  11/01/22 170 lb (77.1 kg)  09/03/22 175 lb (79.4 kg)   Constitutional: overweight, in NAD Eyes: no exophthalmos ENT: no masses palpated in neck, no cervical lymphadenopathy Cardiovascular: RRR, No MRG Respiratory: CTA B Musculoskeletal: no deformities Skin: no rashes Neurological: no tremor with outstretched hands Diabetic Foot Exam - Simple   Simple Foot Form Diabetic Foot exam was performed with the following findings: Yes 01/01/2023  1:51 PM  Visual Inspection No deformities, no ulcerations, no other skin breakdown bilaterally: Yes Sensation Testing Intact to touch and monofilament testing bilaterally: Yes Pulse Check Posterior Tibialis and Dorsalis pulse intact bilaterally: Yes Comments    ASSESSMENT: 1. Hypothyroidism - Due to Hashimoto's thyroiditis  2. DM2, non-insulin-dependent  3.  Overweight  PLAN:  1. Patient with longstanding Hashimoto's hypothyroidism, on levothyroxine therapy; she was previously on  Synthroid but per insurance preference she is switched to levothyroxine generic, which she tolerates well. - latest thyroid labs reviewed with pt. >> TSH low 0.229 - she was on 100 mcg LT4 daily, but this was decreased by PCP 1 month ago to LT4 50 mcg daily - pt feels good on this dose.  She did lose a significant amount of weight since last visit due to not being able to eat well.  She now started to eat better and started to gain weight. - we discussed about taking the thyroid hormone every day, with water, >30 minutes before breakfast, separated by >4 hours from acid reflux medications, calcium, iron, multivitamins. Pt. is taking it correctly.  Since she has to take PPIs 1 hour before levothyroxine, I did advise her to move PPIs later in the day but if not able, to try to chew the tablet for better/faster absorption.  She did stop PPI before last visit. She is now on Pepcid 2x a day (L and D). - will check thyroid tests today: TSH and fT4 and may need to increase the dose of levothyroxine.  She does have 88 mcg LT4 at home - If labs are abnormal, she will need to return for repeat TFTs in 1.5 months  2.  DM2, non-insulin-dependent -Well-controlled, on oral antidiabetic regimen with metformin and SGLT2 inhibitor. -At last visit, HbA1c was 6.6%, at goal, but since then, she had another HbA1c of 6.9% on 12/02/2022 -At last visit, sugars were higher in the morning and she was not checking later in the day consistently.  I advised her to do so so that we understand trends.  Sugars were still elevated in the morning.  She was taking [place] after dinner.  We discussed that this is normally taken before breakfast, but we decided to move it before dinner to see if this would help with her morning sugars. -At today's visit, she is mostly checking blood sugars in the morning and they are at or slightly above target.  During the rest of the day, she rarely checks, but whenever checked, sugars are at goal.  For  now, especially in the setting of her improved HbA1c, it would be ok to continue without [place] but we may need to add it back at next visit.  She has a lower dose of metformin, reduced since last visit, which we can continue for now. - I advised her to: Patient Instructions  Please continue: - Metformin 1000 mg with dinner  Please continue Synthroid 50 mcg daily but we may need to increase to 88 mcg daily  Take the thyroid hormone every day, with water, at least 30 minutes before breakfast, separated by at least 4 hours from: - acid reflux medications - calcium - iron - multivitamins  Please come back for a follow-up appointment in 4 months.  - advised to check sugars at different times of the day - 1x a day, rotating check times - advised for yearly eye exams >> she is UTD - we previously discussed about adding a statin and she was reticent to do so due to her pill burden - I we will see her back in 4 months  3.  Overweight -Currently off Marcelline Deist  -in the past, I recommended the weight management clinic at [facility], but did she did not pursue this -However, she lost 10 pounds before last visit and almost 10 pounds since then  Component     Latest Ref Rng 01/01/2023  T4,Free(Direct)     0.60 - 1.60 ng/dL 1.61 (L)   TSH     0.96 - 5.50 uIU/mL 24.64 (H)   TSH is very high.  Will increase the dose of levothyroxine to 88 mcg daily and repeat the tests in 1.5 months.  Carlus Pavlov, MD PhD [facility] Endocrinology

## 2023-01-06 ENCOUNTER — Ambulatory Visit: Payer: BC Managed Care – PPO | Admitting: Dermatology

## 2023-01-06 ENCOUNTER — Other Ambulatory Visit: Payer: Self-pay

## 2023-01-06 MED ORDER — LEVOTHYROXINE SODIUM 88 MCG PO TABS: 88.0000 ug | ORAL_TABLET | Freq: Every day | ORAL | 0 refills | Status: DC

## 2023-01-25 ENCOUNTER — Other Ambulatory Visit: Payer: Self-pay | Admitting: Internal Medicine

## 2023-01-25 DIAGNOSIS — E119 Type 2 diabetes mellitus without complications: Secondary | ICD-10-CM

## 2023-02-18 ENCOUNTER — Other Ambulatory Visit (INDEPENDENT_AMBULATORY_CARE_PROVIDER_SITE_OTHER): Payer: BC Managed Care – PPO

## 2023-02-18 DIAGNOSIS — E063 Autoimmune thyroiditis: Secondary | ICD-10-CM | POA: Diagnosis not present

## 2023-02-18 DIAGNOSIS — E038 Other specified hypothyroidism: Secondary | ICD-10-CM | POA: Diagnosis not present

## 2023-02-18 LAB — TSH: TSH: 2.69 u[IU]/mL (ref 0.35–5.50)

## 2023-02-18 LAB — T4, FREE: Free T4: 0.84 ng/dL (ref 0.60–1.60)

## 2023-03-19 ENCOUNTER — Other Ambulatory Visit: Payer: Self-pay | Admitting: Dermatology

## 2023-03-19 DIAGNOSIS — L650 Telogen effluvium: Secondary | ICD-10-CM

## 2023-03-30 ENCOUNTER — Other Ambulatory Visit: Payer: Self-pay | Admitting: Internal Medicine

## 2023-03-31 ENCOUNTER — Ambulatory Visit: Payer: BC Managed Care – PPO | Admitting: Dermatology

## 2023-05-06 ENCOUNTER — Encounter: Payer: Self-pay | Admitting: Internal Medicine

## 2023-05-06 ENCOUNTER — Ambulatory Visit (INDEPENDENT_AMBULATORY_CARE_PROVIDER_SITE_OTHER): Payer: BC Managed Care – PPO | Admitting: Internal Medicine

## 2023-05-06 VITALS — BP 124/76 | HR 87 | Ht 66.0 in | Wt 168.8 lb

## 2023-05-06 DIAGNOSIS — E663 Overweight: Secondary | ICD-10-CM

## 2023-05-06 DIAGNOSIS — E063 Autoimmune thyroiditis: Secondary | ICD-10-CM | POA: Diagnosis not present

## 2023-05-06 DIAGNOSIS — Z7984 Long term (current) use of oral hypoglycemic drugs: Secondary | ICD-10-CM | POA: Diagnosis not present

## 2023-05-06 DIAGNOSIS — E1159 Type 2 diabetes mellitus with other circulatory complications: Secondary | ICD-10-CM

## 2023-05-06 LAB — TSH: TSH: 5.8 u[IU]/mL — ABNORMAL HIGH (ref 0.35–5.50)

## 2023-05-06 LAB — MICROALBUMIN / CREATININE URINE RATIO
Creatinine,U: 122.9 mg/dL
Microalb Creat Ratio: 1.4 mg/g (ref 0.0–30.0)
Microalb, Ur: 1.7 mg/dL (ref 0.0–1.9)

## 2023-05-06 LAB — HEMOGLOBIN A1C: Hemoglobin A1C: 6.3

## 2023-05-06 LAB — T4, FREE: Free T4: 0.99 ng/dL (ref 0.60–1.60)

## 2023-05-06 NOTE — Patient Instructions (Addendum)
Please continue: - Metformin 1000 mg with dinner  Please continue levothyroxine 88 mcg daily.  Take the thyroid hormone every day, with water, at least 30 minutes before breakfast, separated by at least 4 hours from: - acid reflux medications - calcium - iron - multivitamins  Please stop at the lab.  Please come back for a follow-up appointment in 4 months.

## 2023-05-06 NOTE — Progress Notes (Signed)
Patient ID: Rebecca Kramer, female   DOB: 1945-08-19, 77 y.o.   MRN: [id number]   HPI  [name] is a 77 y.o.-year-old female, initially referred by her PCP, Dr.Tower, presenting for f/u for Hashimoto hypothyroidism and DM2, non-insulin-dependent, with complications (Aortic atherosclerosis). She was seeing Dr Alessandra Bevels [facility]) >> stopped seeing her 2/2 cost.  Last visit 4 months ago.  Interim history: No increased urination, blurry vision, nausea, chest pain. She eats small meals as she cannot eat large meals any more due to hiatal hernia. (She is s/p 2 surgeries for this).  Hypothyroidism: - dx'ed "many years ago"  She tried: -Generic levothyroxine -Brand names -Then, Naturethroid 81.5 mg + Cytomel 5 mg bid (equivalent to ~180 mcg LT4)  -then, generic levothyroxine -Changed to brand name Synthroid 11/2019 >> but generic LT4 since 08/2020 per insurance preference.  At last visit she was on LT4 50 mcg daily (decreased to 50 mcg daily by PCP 1 mo prior to the visit), but currently on 88 mcg daily: - in am - fasting - at least 30 min from b'fast - + calcium within >4 hours from levothyroxine - no iron - + multivitamins >4 hours of levothyroxine -  stopped; on Pepcid - 2x a day - first dose ~4h later - not on Biotin   Reviewed TFTs: Lab Results  Component Value Date   TSH 2.69 02/18/2023   TSH 24.64 (H) 01/01/2023   TSH 1.37 04/14/2022   TSH 1.68 07/21/2018   TSH 1.24 08/19/2017   TSH 1.52 04/28/2016   TSH 2.02 01/16/2016   TSH 1.97 12/05/2015   TSH 1.27 11/19/2015   TSH 0.43 01/10/2015   FREET4 0.84 02/18/2023   FREET4 0.58 (L) 01/01/2023   FREET4 1.13 04/14/2022   FREET4 1.02 07/21/2018   FREET4 0.99 08/19/2017   FREET4 0.88 04/28/2016   FREET4 0.86 01/16/2016   FREET4 1.02 11/19/2015   FREET4 1.33 01/10/2015   FREET4 1.23 07/23/2014  12/02/2022: TSH 0.229 08/27/2022: TSH 1.533 11/24/2021: TSH 0.861 05/27/2021: TSH 2.251 11/20/2020: TSH  1.615 06/03/2020: TSH 1.554 08/18/2019: TSH 0.951 11/15/2018: 1.086 05/05/2017: TSH 1.233 total T4 9.4 11/02/2016: TSH 0.435, total T4 11.6  03/14/2014: ATA 313 (<2) TPO Abs 39 (<9) TSH 3.552 (0.35-4.5), free T4 0.82 (0.8-1.8), Free T3 2.6 (2.3-4.2), reverse T3 12 (8-25) At the same time, she hadn't hemoglobin A1c 6.3, fasting insulin 16.6, vitamin D 66  Pt denies: - feeling nodules in neck - hoarseness - dysphagia - choking  No FH of thyroid disease or thyroid cancer. No h/o radiation tx to head or neck. No herbal supplements. No Biotin use. No recent po steroids use - but on steroid drops.  She was exercising at [employer] but not during the coronavirus pandemic  DM2: -Diagnosed in 2016  She had acid reflux before and was on PPIs.  She had fundoplication 05/2022 and she changed her diet afterwards.  She came off PPIs.  However, since last visit, she was admitted 11/01/2022 with gastric outlet obstruction, dehydration, hiatal hernia. At that time, she developed nausea with food, then water. She had to have repeated surgery 11/2022.  Reviewed HbA1c levels: 12/02/2022: HbA1c 6.9% 08/27/2022: HbA1c 7.3% Lab Results  Component Value Date   HGBA1C 6.6 (A) 03/03/2022   HGBA1C 6.7 (A) 09/02/2021   HGBA1C 6.1 (A) 03/03/2021   HGBA1C 6.8 (A) 09/03/2020   HGBA1C 6.3 (A) 04/30/2020   HGBA1C 6.7 (A) 07/25/2019   HGBA1C 6.4 (A) 03/28/2019   HGBA1C 6.5 08/28/2016  HGBA1C 6.4 12/05/2015   HGBA1C 6.6 (H) 08/27/2015  11/15/2018: HbA1c 6.8% 05/17/2018: HbA1c 6.9%  She is on: - Metformin ER 2000 >> 1000 mg with D She was previously on Farxiga 5 mg before breakfast >> moved before dinner >> off after hospitalization - admitted 11/01/2022 with gastric outlet obstruction, dehydration, hiatal hernia.  She checks sugars once a day: - am: 111, 126-185, 190 >> 92, 116-143, 153 >> 110-133, 158 - 2h after b'fast: n/c >> 131 >> n/c >> 108 - lunch: 120, 159 >> n/c >> 128 >> 116 >> 98, 120,  131 >> n/c - 2h after lunch: 175-185 >> n/c >> 79, 123 >> n/c - dinner: 99-115 >> 115-112 >> 115>> n/c >> 111 >> 148 - 2h after dinner: 192, 240 >> n/c >> 134 >> n/c >> 125 - bedtime: 104 >> n/c Lowest: 79 >> 125 >> 111 >> 92 >> 110 Highest: 240 >> .Marland Kitchen.190 >> 153 >> 158  She and her husband started walking after the holidays-walking 30 to 40 minutes a day. Started back walking.  No CKD: 12/02/2022: 28/0.7, GFR 89, glucose 80 Lab Results  Component Value Date   BUN 33 (H) 11/01/2022   BUN 20 08/28/2016   Lab Results  Component Value Date   CREATININE 1.13 (H) 11/01/2022   CREATININE 1.00 11/03/2021  No results found for: "MICRALBCREAT" On losartan 100 mg daily.  + HL: 08/27/2022: 183/186/48.1/98 11/24/2021: 214/155/54.2/129 05/27/2021: 195/152/45.5/119 11/20/2020: 202/123/52.3/125 06/03/2020: 206/127/52.7/128 05/11/2019: 188/248/42.7/96 11/15/2018: 185/144/46.5/106 Lab Results  Component Value Date   CHOL 195 08/28/2016   HDL 51.30 08/28/2016   LDLCALC 118 (H) 08/28/2016   LDLDIRECT 129.6 07/17/2011   TRIG 130.0 08/28/2016   CHOLHDL 4 08/28/2016  Not on a statin.  Last eye exam: 2024: No DR.  She had cataracts >> had surgery 05/2020.    Last foot exam 01/01/2023, here in clinic.  She has sleep apnea - has a CPAP - does not like the machine.  ROS: + see HPI  I reviewed pt's medications, allergies, PMH, social hx, family hx, and changes were documented in the history of present illness. Otherwise, unchanged from my initial visit note.  Past Medical History:  Diagnosis Date   Arthritis    Barrett's esophagus    Bone spur    Left foot / posterior   Diabetes mellitus without complication (HCC)    Diverticulosis    GERD (gastroesophageal reflux disease)    esophagitis with ulceration   Hypertension    Hypothyroidism    Menopausal symptoms    hot flashes   Osteopenia    Pure hypercholesterolemia    Sleep apnea 2007   AHI 45/hr in 2007   Past Surgical History:   Procedure Laterality Date   ABDOMINAL HYSTERECTOMY  1983   partial/endometriosis   BREAST EXCISIONAL BIOPSY Right    BREAST SURGERY  1970's   breast lumpectomy/benign   BREAST SURGERY  1994   breast implants/saline   CATARACT EXTRACTION     COLONOSCOPY WITH PROPOFOL N/A 04/20/2017   Procedure: COLONOSCOPY WITH PROPOFOL;  Surgeon: Christena Deem, MD;  Location: [facility] ENDOSCOPY;  Service: Endoscopy;  Laterality: N/A;   COLONOSCOPY WITH PROPOFOL N/A 06/17/2017   Procedure: COLONOSCOPY WITH PROPOFOL;  Surgeon: Christena Deem, MD;  Location: [facility] ENDOSCOPY;  Service: Endoscopy;  Laterality: N/A;   COLONOSCOPY WITH PROPOFOL N/A 01/16/2022   Procedure: COLONOSCOPY WITH PROPOFOL;  Surgeon: Regis Bill, MD;  Location: [facility] ENDOSCOPY;  Service: Endoscopy;  Laterality: N/A;  DM  DORSAL COMPARTMENT RELEASE  05/14/2011   Procedure: RELEASE DORSAL COMPARTMENT (DEQUERVAIN);  Surgeon: Wyn Forster., MD;  Location: [facility];  Service: Orthopedics;  Laterality: Right;  right release dorsal compartment/ de quervain release with debridement of mucoid cyst   ESOPHAGOGASTRIC FUNDOPLICATION  05/2022   ESOPHAGOGASTRODUODENOSCOPY  03/2008   esophagitis with ulcerations/?barretts   ESOPHAGOGASTRODUODENOSCOPY (EGD) WITH PROPOFOL N/A 04/20/2017   Procedure: ESOPHAGOGASTRODUODENOSCOPY (EGD) WITH PROPOFOL;  Surgeon: Christena Deem, MD;  Location: [facility] ENDOSCOPY;  Service: Endoscopy;  Laterality: N/A;   ESOPHAGOGASTRODUODENOSCOPY (EGD) WITH PROPOFOL  06/17/2017   Procedure: ESOPHAGOGASTRODUODENOSCOPY (EGD) WITH PROPOFOL;  Surgeon: Christena Deem, MD;  Location: [facility] ENDOSCOPY;  Service: Endoscopy;;   ESOPHAGOGASTRODUODENOSCOPY (EGD) WITH PROPOFOL N/A 01/16/2022   Procedure: ESOPHAGOGASTRODUODENOSCOPY (EGD) WITH PROPOFOL;  Surgeon: Regis Bill, MD;  Location: [facility] ENDOSCOPY;  Service: Endoscopy;  Laterality: N/A;   ESOPHAGOGASTRODUODENOSCOPY (EGD) WITH PROPOFOL N/A  04/24/2022   Procedure: ESOPHAGOGASTRODUODENOSCOPY (EGD) WITH PROPOFOL;  Surgeon: Regis Bill, MD;  Location: [facility] ENDOSCOPY;  Service: Endoscopy;  Laterality: N/A;   EYE SURGERY     thumb nodule     removed   TONSILLECTOMY     History   Social History   Marital Status: Married    Spouse Name: N/A    Number of Children: 2   Occupational History   retired   Social History Main Topics   Smoking status: Never Smoker    Smokeless tobacco: Never Used   Alcohol Use: No   Drug Use: No   Current Outpatient Medications on File Prior to Visit  Medication Sig Dispense Refill   amLODipine (NORVASC) 5 MG tablet      calcium-vitamin D (OSCAL WITH D) 500-200 MG-UNIT tablet Take 1 tablet by mouth.     dapagliflozin propanediol (FARXIGA) 5 MG TABS tablet Take 1 tablet (5 mg total) by mouth daily before breakfast. 7 tablet 0   dutasteride (AVODART) 0.5 MG capsule Take 1 capsule (0.5 mg total) by mouth daily. 30 capsule 5   levothyroxine (SYNTHROID) 88 MCG tablet TAKE 1 TABLET DAILY 90 tablet 3   losartan (COZAAR) 50 MG tablet Take 1 tablet (50 mg total) by mouth daily. (Patient taking differently: Take 50 mg by mouth 2 times daily at 12 noon and 4 pm.) 90 tablet 1   metFORMIN (GLUCOPHAGE-XR) 500 MG 24 hr tablet TAKE 4 TABLETS DAILY WITH SUPPER 360 tablet 3   Multiple Vitamin (MULTIVITAMIN) tablet Take 1 tablet by mouth daily.     No current facility-administered medications on file prior to visit.   Allergies  Allergen Reactions   Benzonatate     Hot flash   Dilaudid [Hydromorphone Hcl] Other (See Comments)    Face turned red; bp   Family History  Problem Relation Age of Onset   Hypertension Mother    Hyperlipidemia Mother    Colon polyps Mother    Heart disease Mother    Migraines Mother    Cancer Father        lung CA   Migraines Brother    Cancer Maternal Aunt        breast CA   Breast cancer Maternal Aunt    PE: BP 124/76   Pulse 87   Ht 5\' 6"  (1.676 m)   Wt 168  lb 12.8 oz (76.6 kg)   SpO2 99%   BMI 27.25 kg/m  Wt Readings from Last 3 Encounters:  05/06/23 168 lb 12.8 oz (76.6 kg)  01/01/23 164 lb  6.4 oz (74.6 kg)  11/01/22 170 lb (77.1 kg)   Constitutional: overweight, in NAD Eyes: no exophthalmos ENT: no masses palpated in neck, no cervical lymphadenopathy Cardiovascular: RRR, No MRG Respiratory: CTA B Musculoskeletal: no deformities Skin: no rashes Neurological: no tremor with outstretched hands  ASSESSMENT: 1. Hypothyroidism - Due to Hashimoto's thyroiditis  2. DM2, non-insulin-dependent - Ao ATS - per CT abd (11/01/2022)  3.  Overweight  PLAN:  1. Patient with longstanding Hashimoto's hypothyroidism, on levothyroxine therapy; she was previously on Synthroid but per insurance regimen she was switched to levothyroxine generic, which she tolerates well -At last visit, she returned on a much lower dose of levothyroxine, 50 mcg daily, halved by PCP after a TSH returned slightly low, at 0.229. -As expected, her TSH at last visit returned very high, and 24.5 so we increased her levothyroxine dose to 88 mcg daily. - latest thyroid labs reviewed with pt. >> normal: Lab Results  Component Value Date   TSH 2.69 02/18/2023  - she continues on LT4 88 mcg daily - pt feels good on this dose. - we discussed about taking the thyroid hormone every day, with water, >30 minutes before breakfast, separated by >4 hours from acid reflux medications, calcium, iron, multivitamins. Pt. is taking it correctly. - will check thyroid tests today: TSH and fT4 - If labs are abnormal, she will need to return for repeat TFTs in 1.5 months  2.  DM2, non-insulin-dependent -Well-controlled, on oral antidiabetic regimen with metformin only -Latest HbA1c obtained 2 months ago was 6.6%, lower. -At last visit she was mostly checking blood sugars in the morning and they were at or slightly above target.  She rarely checks later in the day but whenever she did, sugars  were still at goal.  We did not change her regimen but I did advise him to rotate the blood sugar checks more. -At today's visit, sugars are mostly at goal with only few mild hyperglycemic exceptions.  However, she is still only checking in the morning and we again discussed about the importance of checking some blood sugars later in the day, rotating check times.  Otherwise, we do not need to change her regimen for now. - I advised her to: Patient Instructions  Please continue: - Metformin 1000 mg with dinner  Please continue levothyroxine 88 mcg daily.  Take the thyroid hormone every day, with water, at least 30 minutes before breakfast, separated by at least 4 hours from: - acid reflux medications - calcium - iron - multivitamins  Please stop at the lab.  Please come back for a follow-up appointment in 4 months.  - HbA1c today is lower: 6.3% - advised to check sugars at different times of the day - 1x a day, rotating check times - advised for yearly eye exams >> she is UTD - we previously discussed about adding a statin and she was reticent to do so due to her pill burden - I we will see her back in 4 months  3.  Overweight -Currently off Farxiga but on metformin which has a mild appetite suppressant effect -in the past, I recommended the weight management clinic at [facility], but did she did not pursue this -She lost 10 pounds before last visit and 10 pounds before the previous visit -he gained 4 lbs since last OV  Component     Latest Ref Rng 05/06/2023  T4,Free(Direct)     0.60 - 1.60 ng/dL 5.40   TSH     9.81 -  5.50 uIU/mL 5.80 (H)   TSH is a little high.  Due to previous normal test on this dose, I would suggest to recheck the test in 1.5 months before changing the dose.  Will discuss to try to make sure that she is taking Pepcid at least 3-4 hours after levothyroxine.  Carlus Pavlov, MD PhD [facility] End[facility]

## 2023-05-19 ENCOUNTER — Encounter: Payer: BC Managed Care – PPO | Admitting: Dermatology

## 2023-06-18 ENCOUNTER — Telehealth: Payer: Self-pay

## 2023-06-18 MED ORDER — LEVOTHYROXINE SODIUM 100 MCG PO TABS: 100.0000 ug | ORAL_TABLET | Freq: Every day | ORAL | 2 refills | Status: DC

## 2023-06-18 NOTE — Telephone Encounter (Signed)
J, I reviewed the labs.  The TSH is 7.7, high.  Let's increase the dose of levothyroxine to 100 mcg daily and recheck the labs in 1.5 months.  Can you please send this to the pharmacy (for 45 days with 2 refills) and take off the 88 mcg dose from her medication list.  Also, lets order a TSH and free T4 for her. Ty! C

## 2023-06-18 NOTE — Telephone Encounter (Addendum)
Pt has been notified and voices understanding. Also she states that she has some 100 mcg tabs at home so she is going to take those until they are gone. Rx has been sent to [facility].   Also patient is going to call back in January to let me know where she is going to get her labs done at.

## 2023-06-18 NOTE — Telephone Encounter (Signed)
Patient has labs done at another office. They have been placed under her Media tab. Pt is wanting to know if she needs to change her medication.   Please advise,

## 2023-08-04 ENCOUNTER — Telehealth: Payer: Self-pay

## 2023-08-04 NOTE — Telephone Encounter (Signed)
Pt called stating that her TSH result was 3.161 done on 08/03/23

## 2023-09-06 ENCOUNTER — Ambulatory Visit: Payer: BC Managed Care – PPO | Admitting: Internal Medicine

## 2023-09-06 ENCOUNTER — Encounter: Payer: Self-pay | Admitting: Internal Medicine

## 2023-09-06 VITALS — BP 120/70 | HR 58 | Ht 66.0 in | Wt 169.4 lb

## 2023-09-06 DIAGNOSIS — E119 Type 2 diabetes mellitus without complications: Secondary | ICD-10-CM | POA: Diagnosis not present

## 2023-09-06 DIAGNOSIS — Z7984 Long term (current) use of oral hypoglycemic drugs: Secondary | ICD-10-CM

## 2023-09-06 LAB — POCT GLYCOSYLATED HEMOGLOBIN (HGB A1C): Hemoglobin A1C: 6.4 % — AB (ref 4.0–5.6)

## 2023-09-06 MED ORDER — METFORMIN HCL ER 500 MG PO TB24: 1000.0000 mg | ORAL_TABLET | Freq: Every day | ORAL | Status: DC

## 2023-09-06 MED ORDER — LEVOTHYROXINE SODIUM 100 MCG PO TABS: 100.0000 ug | ORAL_TABLET | Freq: Every day | ORAL | 3 refills | Status: DC

## 2023-09-06 NOTE — Progress Notes (Signed)
 Patient ID: Rebecca Kramer, female   DOB: 1946-07-02, 78 y.o.   MRN: [id number]   HPI  Rebecca Kramer is a 78 y.o.-year-old female, initially referred by her PCP, Dr.Tower, presenting for f/u for Hashimoto hypothyroidism and DM2, non-insulin-dependent, with complications (Aortic atherosclerosis). She was seeing Dr Alessandra Bevels [facility]) >> stopped seeing her 2/2 cost.  Last visit 4 months ago.  Interim history: No increased urination, blurry vision, nausea, chest pain.  She is eating small meals as she could not eat large meals any more due to hiatal hernia. (She is s/p 2 surgeries for this).  Hypothyroidism: - dx'ed "many years ago"  She tried: -Generic levothyroxine -Brand names -Then, Naturethroid 81.5 mg + Cytomel 5 mg bid (equivalent to ~180 mcg LT4)  -then, generic levothyroxine -Changed to brand name Synthroid 11/2019 >> but generic LT4 since 08/2020 per insurance preference.  She is on 100 mcg LT4 daily (dose increased 06/2023) - chewing it: - in am - fasting - coffee + artificial sweetener + creamer - at least 30 min from b'fast - + calcium within >4 hours from levothyroxine - no iron - + multivitamins >4 hours of levothyroxine  -  stopped; on Pepcid prn - 2x a day - first dose 2h later (does this every am) - not on Biotin   Reviewed TFTs: 08/03/2023: TSH 3.161  06/09/2023: TSH 7.7 Lab Results  Component Value Date   TSH 5.80 (H) 05/06/2023   TSH 2.69 02/18/2023   TSH 24.64 (H) 01/01/2023   TSH 1.37 04/14/2022   TSH 1.68 07/21/2018   TSH 1.24 08/19/2017   TSH 1.52 04/28/2016   TSH 2.02 01/16/2016   TSH 1.97 12/05/2015   TSH 1.27 11/19/2015   FREET4 0.99 05/06/2023   FREET4 0.84 02/18/2023   FREET4 0.58 (L) 01/01/2023   FREET4 1.13 04/14/2022   FREET4 1.02 07/21/2018   FREET4 0.99 08/19/2017   FREET4 0.88 04/28/2016   FREET4 0.86 01/16/2016   FREET4 1.02 11/19/2015   FREET4 1.33 01/10/2015  12/02/2022: TSH 0.229 08/27/2022: TSH 1.533 11/24/2021:  TSH 0.861 05/27/2021: TSH 2.251 11/20/2020: TSH 1.615 06/03/2020: TSH 1.554 08/18/2019: TSH 0.951 11/15/2018: 1.086 05/05/2017: TSH 1.233 total T4 9.4 11/02/2016: TSH 0.435, total T4 11.6  03/14/2014: ATA 313 (<2) TPO Abs 39 (<9) TSH 3.552 (0.35-4.5), free T4 0.82 (0.8-1.8), Free T3 2.6 (2.3-4.2), reverse T3 12 (8-25) At the same time, she hadn't hemoglobin A1c 6.3, fasting insulin 16.6, vitamin D 66  Pt denies: - feeling nodules in neck - hoarseness - dysphagia - choking  No FH of thyroid disease or thyroid cancer. No h/o radiation tx to head or neck. No herbal supplements. No Biotin use. No recent po steroids use - but on steroid drops.  She was exercising at [employer] but not during the coronavirus pandemic  DM2: -Diagnosed in 2016  She had acid reflux before and was on PPIs.  She had fundoplication 05/2022 and she changed her diet afterwards.  She came off PPIs.  However, since last visit, she was admitted 11/01/2022 with gastric outlet obstruction, dehydration, hiatal hernia. At that time, she developed nausea with food, then water. She had to have repeated surgery 11/2022.  Reviewed HbA1c levels: 06/09/2023: HbA1c 6.9% 05/06/2023: HbA1c 6.3% 12/02/2022: HbA1c 6.9% 08/27/2022: HbA1c 7.3% Lab Results  Component Value Date   HGBA1C 6.6 (A) 03/03/2022   HGBA1C 6.7 (A) 09/02/2021   HGBA1C 6.1 (A) 03/03/2021   HGBA1C 6.8 (A) 09/03/2020   HGBA1C 6.3 (A) 04/30/2020   HGBA1C  6.7 (A) 07/25/2019   HGBA1C 6.4 (A) 03/28/2019   HGBA1C 6.5 08/28/2016   HGBA1C 6.4 12/05/2015   HGBA1C 6.6 (H) 08/27/2015  11/15/2018: HbA1c 6.8% 05/17/2018: HbA1c 6.9%  She is on: - Metformin ER 2000 >> 1000 mg with D She was previously on Farxiga 5 mg before breakfast >> moved before dinner >> off after hospitalization - admitted 11/01/2022 with gastric outlet obstruction, dehydration, hiatal hernia.  She checks sugars once a day: - am: 92, 116-143, 153 >> 110-133, 158 >> 116, 126-156, 163 -  2h after b'fast: n/c >> 131 >> n/c >> 108 >> n/c - lunch: 128 >> 116 >> 98, 120, 131 >> n/c - 2h after lunch: 175-185 >> n/c >> 79, 123 >> n/c - dinner: 115-112 >> 115>> n/c >> 111 >> 148 >> n/c - 2h after dinner: 192, 240 >> n/c >> 134 >> n/c >> 125 >> n/c - bedtime: 104 >> n/c Lowest: 79 >> 125 >> 111 >> 92 >> 110 >> 116 Highest: 240 >> .Marland Kitchen.190 >> 153 >> 158 >> 163  She and her husband started walking after the holidays-walking 30 to 40 minutes a day. Started back walking.  No CKD: 06/09/2023:  12/02/2022: 28/0.7, GFR 89, glucose 80 Lab Results  Component Value Date   BUN 33 (H) 11/01/2022   BUN 20 08/28/2016   Lab Results  Component Value Date   CREATININE 1.13 (H) 11/01/2022   CREATININE 1.00 11/03/2021   Lab Results  Component Value Date   MICRALBCREAT 1.4 05/06/2023   On losartan 100 mg daily.  + HL:  08/27/2022: 183/186/48.1/98 11/24/2021: 214/155/54.2/129 ... Lab Results  Component Value Date   CHOL 195 08/28/2016   HDL 51.30 08/28/2016   LDLCALC 118 (H) 08/28/2016   LDLDIRECT 129.6 07/17/2011   TRIG 130.0 08/28/2016   CHOLHDL 4 08/28/2016  She declined statins due to pill burden.  Last eye exam: 2024: No DR.  She had cataracts >> had surgery 05/2020.    Last foot exam 01/01/2023, here in clinic.  She has sleep apnea - has a CPAP - does not like the machine.  ROS: + see HPI  I reviewed pt's medications, allergies, PMH, social hx, family hx, and changes were documented in the history of present illness. Otherwise, unchanged from my initial visit note.  Past Medical History:  Diagnosis Date   Arthritis    Barrett's esophagus    Bone spur    Left foot / posterior   Diabetes mellitus without complication (HCC)    Diverticulosis    GERD (gastroesophageal reflux disease)    esophagitis with ulceration   Hypertension    Hypothyroidism    Menopausal symptoms    hot flashes   Osteopenia    Pure hypercholesterolemia    Sleep apnea 2007   AHI 45/hr in  2007   Past Surgical History:  Procedure Laterality Date   ABDOMINAL HYSTERECTOMY  1983   partial/endometriosis   BREAST EXCISIONAL BIOPSY Right    BREAST SURGERY  1970's   breast lumpectomy/benign   BREAST SURGERY  1994   breast implants/saline   CATARACT EXTRACTION     COLONOSCOPY WITH PROPOFOL N/A 04/20/2017   Procedure: COLONOSCOPY WITH PROPOFOL;  Surgeon: Christena Deem, MD;  Location: [facility] ENDOSCOPY;  Service: Endoscopy;  Laterality: N/A;   COLONOSCOPY WITH PROPOFOL N/A 06/17/2017   Procedure: COLONOSCOPY WITH PROPOFOL;  Surgeon: Christena Deem, MD;  Location: [facility] ENDOSCOPY;  Service: Endoscopy;  Laterality: N/A;   COLONOSCOPY WITH PROPOFOL N/A  01/16/2022   Procedure: COLONOSCOPY WITH PROPOFOL;  Surgeon: Regis Bill, MD;  Location: [facility] ENDOSCOPY;  Service: Endoscopy;  Laterality: N/A;  DM   DORSAL COMPARTMENT RELEASE  05/14/2011   Procedure: RELEASE DORSAL COMPARTMENT (DEQUERVAIN);  Surgeon: Wyn Forster., MD;  Location: [facility];  Service: Orthopedics;  Laterality: Right;  right release dorsal compartment/ de quervain release with debridement of mucoid cyst   ESOPHAGOGASTRIC FUNDOPLICATION  05/2022   ESOPHAGOGASTRODUODENOSCOPY  03/2008   esophagitis with ulcerations/?barretts   ESOPHAGOGASTRODUODENOSCOPY (EGD) WITH PROPOFOL N/A 04/20/2017   Procedure: ESOPHAGOGASTRODUODENOSCOPY (EGD) WITH PROPOFOL;  Surgeon: Christena Deem, MD;  Location: [facility] ENDOSCOPY;  Service: Endoscopy;  Laterality: N/A;   ESOPHAGOGASTRODUODENOSCOPY (EGD) WITH PROPOFOL  06/17/2017   Procedure: ESOPHAGOGASTRODUODENOSCOPY (EGD) WITH PROPOFOL;  Surgeon: Christena Deem, MD;  Location: [facility] ENDOSCOPY;  Service: Endoscopy;;   ESOPHAGOGASTRODUODENOSCOPY (EGD) WITH PROPOFOL N/A 01/16/2022   Procedure: ESOPHAGOGASTRODUODENOSCOPY (EGD) WITH PROPOFOL;  Surgeon: Regis Bill, MD;  Location: [facility] ENDOSCOPY;  Service: Endoscopy;  Laterality: N/A;    ESOPHAGOGASTRODUODENOSCOPY (EGD) WITH PROPOFOL N/A 04/24/2022   Procedure: ESOPHAGOGASTRODUODENOSCOPY (EGD) WITH PROPOFOL;  Surgeon: Regis Bill, MD;  Location: [facility] ENDOSCOPY;  Service: Endoscopy;  Laterality: N/A;   EYE SURGERY     thumb nodule     removed   TONSILLECTOMY     History   Social History   Marital Status: Married    Spouse Name: N/A    Number of Children: 2   Occupational History   retired   Social History Main Topics   Smoking status: Never Smoker    Smokeless tobacco: Never Used   Alcohol Use: No   Drug Use: No   Current Outpatient Medications on File Prior to Visit  Medication Sig Dispense Refill   amLODipine (NORVASC) 5 MG tablet      calcium-vitamin D (OSCAL WITH D) 500-200 MG-UNIT tablet Take 1 tablet by mouth.     dutasteride (AVODART) 0.5 MG capsule Take 1 capsule (0.5 mg total) by mouth daily. 30 capsule 5   levothyroxine (SYNTHROID) 100 MCG tablet Take 1 tablet (100 mcg total) by mouth daily. 45 tablet 2   losartan (COZAAR) 50 MG tablet Take 1 tablet (50 mg total) by mouth daily. (Patient taking differently: Take 50 mg by mouth 2 times daily at 12 noon and 4 pm.) 90 tablet 1   metFORMIN (GLUCOPHAGE-XR) 500 MG 24 hr tablet TAKE 4 TABLETS DAILY WITH SUPPER 360 tablet 3   Multiple Vitamin (MULTIVITAMIN) tablet Take 1 tablet by mouth daily.     No current facility-administered medications on file prior to visit.   Allergies  Allergen Reactions   Benzonatate     Hot flash   Dilaudid [Hydromorphone Hcl] Other (See Comments)    Face turned red; bp   Family History  Problem Relation Age of Onset   Hypertension Mother    Hyperlipidemia Mother    Colon polyps Mother    Heart disease Mother    Migraines Mother    Cancer Father        lung CA   Migraines Brother    Cancer Maternal Aunt        breast CA   Breast cancer Maternal Aunt    PE: BP 120/70   Pulse (!) 58   Ht 5\' 6"  (1.676 m)   Wt 169 lb 6.4 oz (76.8 kg)   SpO2 93%   BMI 27.34  kg/m  Wt Readings from Last 3 Encounters:  09/06/23  169 lb 6.4 oz (76.8 kg)  05/06/23 168 lb 12.8 oz (76.6 kg)  01/01/23 164 lb 6.4 oz (74.6 kg)   Constitutional: overweight, in NAD Eyes: no exophthalmos ENT: no masses palpated in neck, no cervical lymphadenopathy Cardiovascular: RRR, No MRG Respiratory: CTA B Musculoskeletal: no deformities Skin: no rashes Neurological: no tremor with outstretched hands  ASSESSMENT: 1. Hypothyroidism - Due to Hashimoto's thyroiditis  2. DM2, non-insulin-dependent - Ao ATS - per CT abd (11/01/2022)  3.  Overweight  PLAN:  1. Patient with longstanding Hashimoto's hypothyroidism, on levothyroxine therapy; she was previously on Synthroid but per insurance requirements, she was switched to levothyroxine generic, which she tolerates well - latest thyroid labs reviewed with pt. >> TSH was still elevated in 06/2023, even slightly higher than the value obtained in 04/2023.  At that time, we increased her LT4 dose.  A subsequent TSH level was normal on 08/03/2023. - she continues on LT4 100 mcg daily - pt feels good on this dose. - we discussed about taking the thyroid hormone every day, with water, >30 minutes before breakfast, separated by >4 hours from acid reflux medications, calcium, iron, multivitamins. Pt. is taking it correctly with the exception of Pepcid, which she is taking only 2 hours after levothyroxine.  I believe that this is the reason why she started to require a higher dose of LT4.  Will continue this for now. - I refilled her LT4 - will check thyroid tests at next OV  2.  DM2, non-insulin-dependent -Well-controlled, on oral antidiabetic regimen with metformin only -At last visit, HbA1c was improved, at 6.3%.  However, 3 months ago she had another HbA1c which was higher, at 6.9% -At last visit sugars were mostly at goal with only few mild hyperglycemic exceptions.  She was still only checking in the morning and we again discussed about  the importance of checking later in the day to understand trends.  We did not change her regimen at that time. -At today's visit, sugars are higher in the morning, with quite a few values above target. She is not checking sugars later in the day at all, as she forgets.  She did check in the past week and she mentions that they were at the lower end of the normal interval.  For now, we will not change her regimen but I did advise her to try to move the metformin at bedtime to see if this would help the sugars in the morning.  Due to her previous gastric surgeries, I wonder if it would be beneficial for her to crush the metformin dose.  For now, I did not advise her to do with this to see if she would benefit from taking it at bedtime first.  Another option would be to increase the metformin dose. - I advised her to: Patient Instructions  Please move: - Metformin 1000 mg at bedtime  Please continue levothyroxine 100 mcg daily.  Take the thyroid hormone every day, with water, at least 30 minutes before breakfast, separated by at least 4 hours from: - acid reflux medications - calcium - iron - multivitamins  Please stop at the lab.  Please come back for a follow-up appointment in 4 months.  - we checked her HbA1c: 6.4% (lower) - advised to check sugars at different times of the day - 1x a day, rotating check times - advised for yearly eye exams >> she is UTD - we previously discussed about adding a statin and she was reticent to  do so due to her pill burden - return to clinic in 4 months  3.  Overweight -She continues only on metformin, which has a mild appetite suppressant effect. -Previously on Farxiga but came off (see above) -In the past I recommended a weight management clinic at [facility] but she declined -She lost 20 pounds before the last 2 visits combined and gained 4 before the last visit -She gained 1 pound since last visit  Carlus Pavlov, MD PhD [facility]

## 2023-09-06 NOTE — Patient Instructions (Addendum)
 Please move: - Metformin 1000 mg at bedtime  Please continue levothyroxine 100 mcg daily.  Take the thyroid hormone every day, with water, at least 30 minutes before breakfast, separated by at least 4 hours from: - acid reflux medications - calcium - iron - multivitamins  Please stop at the lab.  Please come back for a follow-up appointment in 4 months.

## 2023-09-07 ENCOUNTER — Ambulatory Visit: Attending: Otolaryngology

## 2023-09-07 DIAGNOSIS — G4733 Obstructive sleep apnea (adult) (pediatric): Secondary | ICD-10-CM | POA: Diagnosis not present

## 2023-09-07 DIAGNOSIS — Q796 Ehlers-Danlos syndrome, unspecified: Secondary | ICD-10-CM | POA: Diagnosis present

## 2023-09-07 DIAGNOSIS — R0683 Snoring: Secondary | ICD-10-CM | POA: Diagnosis present

## 2023-09-07 DIAGNOSIS — I10 Essential (primary) hypertension: Secondary | ICD-10-CM | POA: Insufficient documentation

## 2023-09-07 DIAGNOSIS — G4761 Periodic limb movement disorder: Secondary | ICD-10-CM | POA: Insufficient documentation

## 2023-09-07 DIAGNOSIS — K219 Gastro-esophageal reflux disease without esophagitis: Secondary | ICD-10-CM | POA: Insufficient documentation

## 2023-09-08 ENCOUNTER — Other Ambulatory Visit: Payer: Self-pay | Admitting: Internal Medicine

## 2023-09-08 DIAGNOSIS — Z1231 Encounter for screening mammogram for malignant neoplasm of breast: Secondary | ICD-10-CM

## 2023-10-08 ENCOUNTER — Ambulatory Visit
Admission: RE | Admit: 2023-10-08 | Discharge: 2023-10-08 | Disposition: A | Discharge status: Home / Self Care | Source: Ambulatory Visit | Attending: Internal Medicine | Admitting: Internal Medicine

## 2023-10-08 DIAGNOSIS — Z1231 Encounter for screening mammogram for malignant neoplasm of breast: Secondary | ICD-10-CM | POA: Insufficient documentation

## 2023-12-04 IMAGING — CT CT ABD-PELV W/ CM
2 of 5 series · 16 of 46 positions shown, 18 images · IV contrast (agent unspecified)
Comparison: None.

CLINICAL DATA: Hiatal hernia, acid reflux

EXAM:
CT ABDOMEN AND PELVIS WITH CONTRAST
TECHNIQUE: Multidetector CT imaging of the abdomen and pelvis was performed
using the standard protocol following bolus administration of
intravenous contrast.

[Series 2: routine abd/pel with · axial · 0.93mm/px · z∈[-968,-503]mm · 13 of 105 slices shown, 15 images]
[im 6/105  soft-tissue]
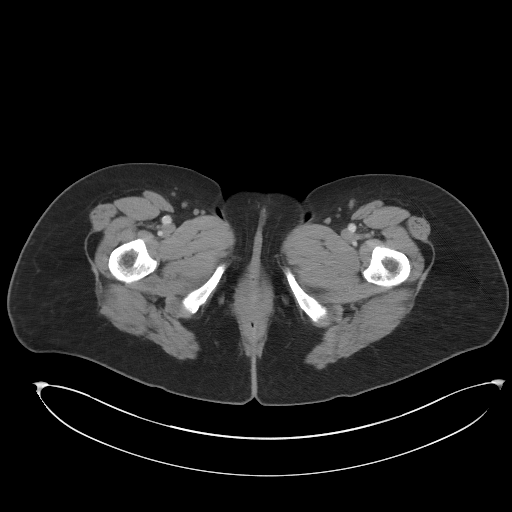
[im 6/105  bone]
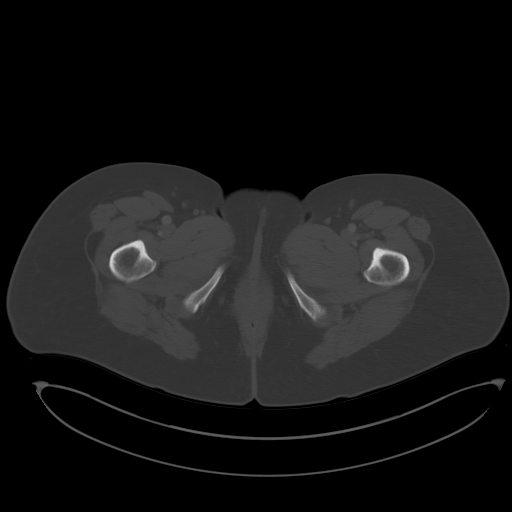
[im 17/105  soft-tissue]
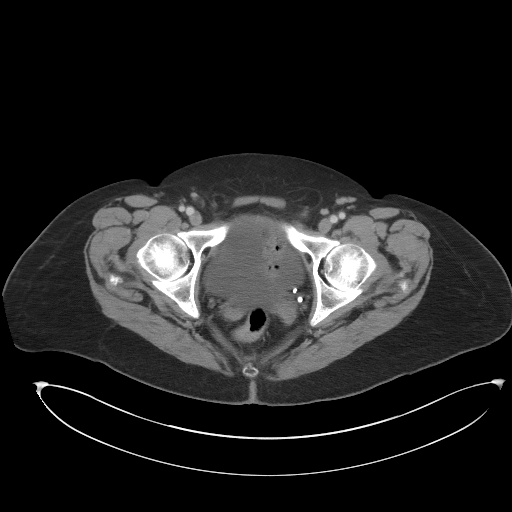
[im 22/105  soft-tissue]
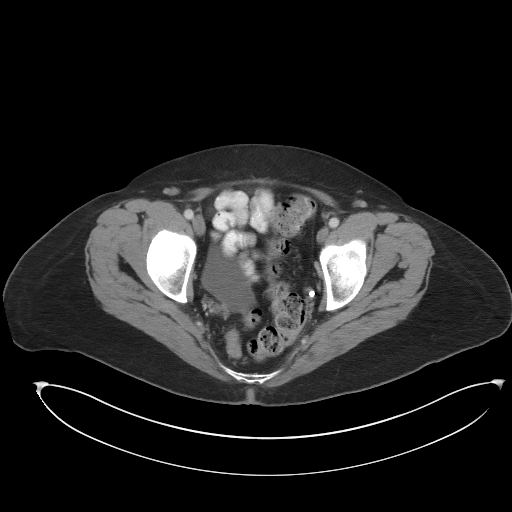
[im 28/105  soft-tissue]
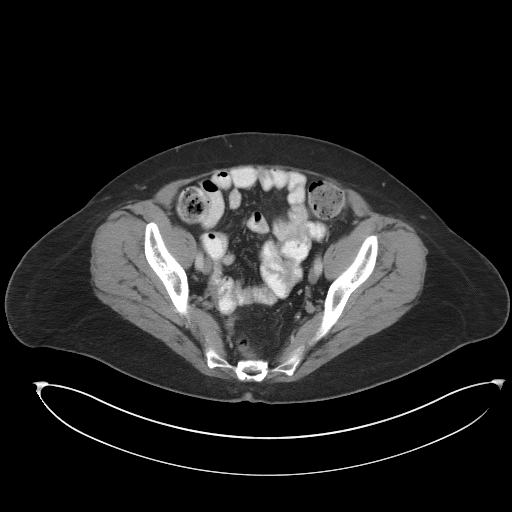
[im 39/105  soft-tissue]
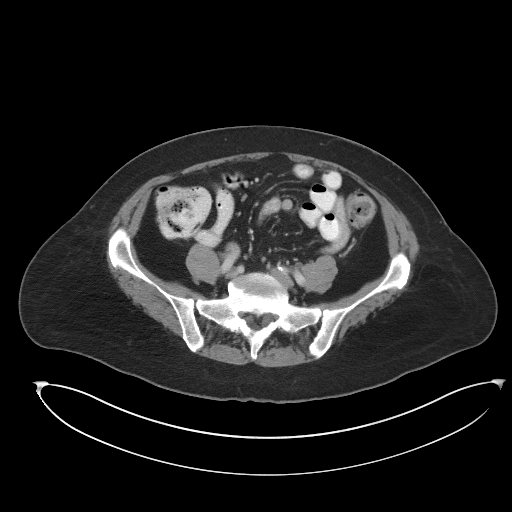
[im 44/105  soft-tissue]
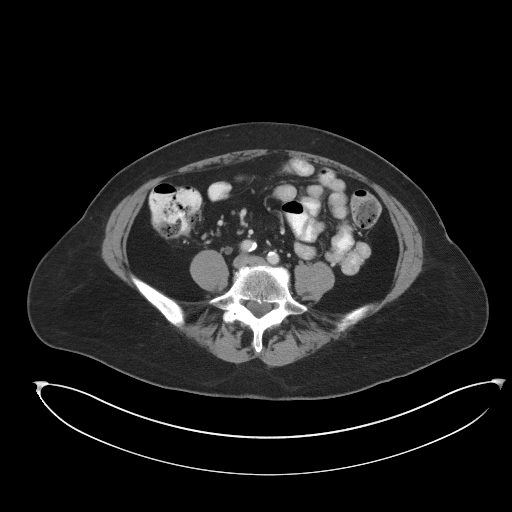
[im 55/105  soft-tissue]
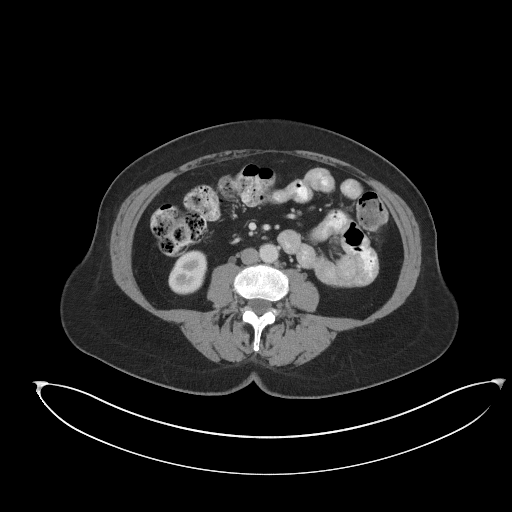
[im 61/105  soft-tissue]
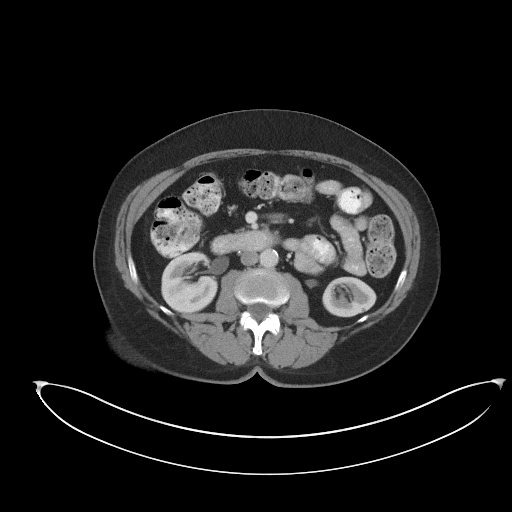
[im 66/105  soft-tissue]
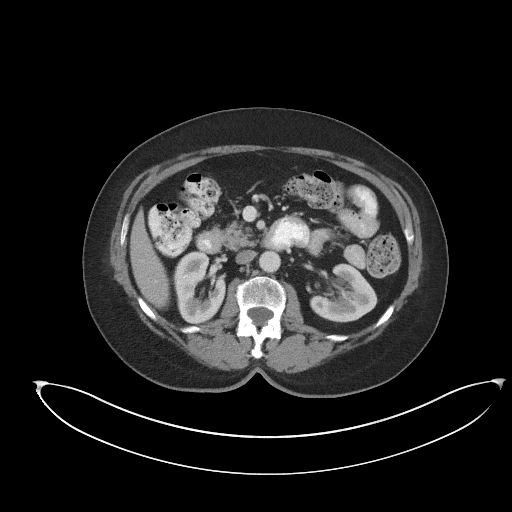
[im 66/105  bone]
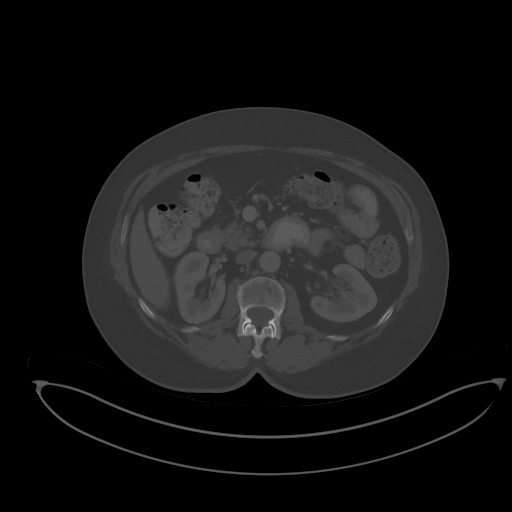
[im 77/105  soft-tissue]
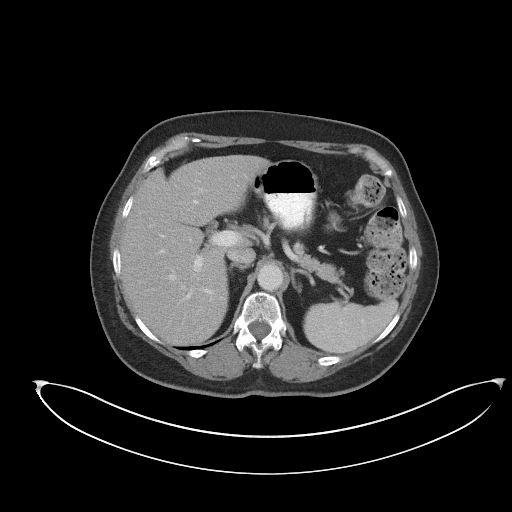
[im 83/105  soft-tissue]
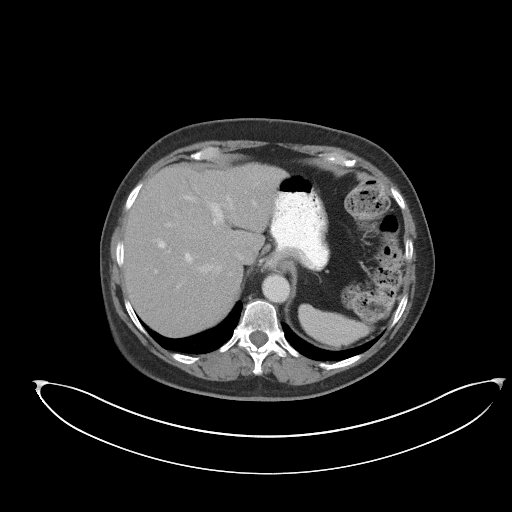
[im 88/105  soft-tissue]
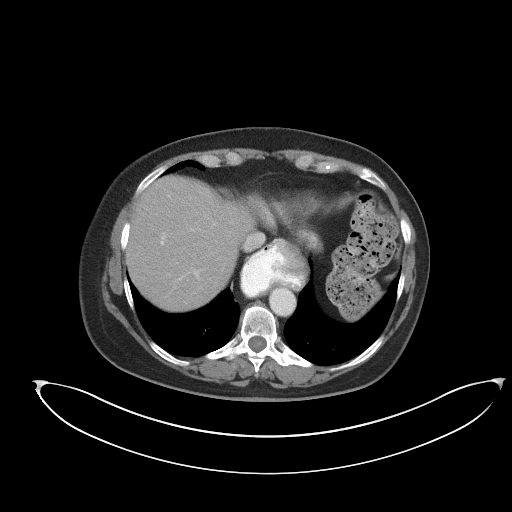
[im 99/105  soft-tissue]
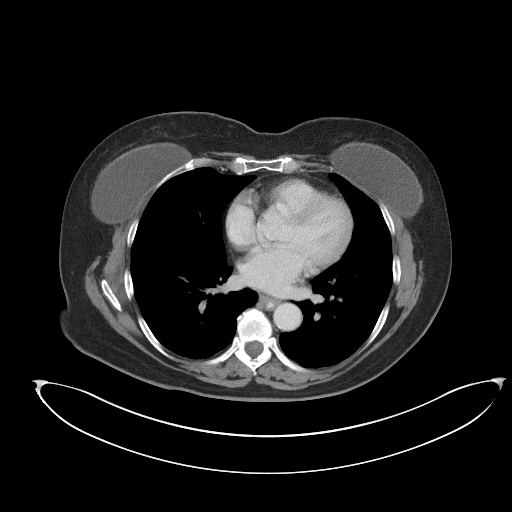

[Series 5: coronal st · coronal · 0.83mm/px · 3 of 94 slices shown]
[im 32/94  soft-tissue]
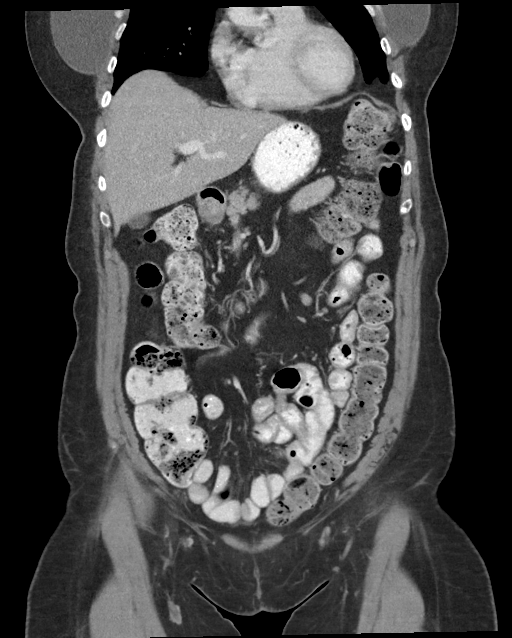
[im 42/94  soft-tissue]
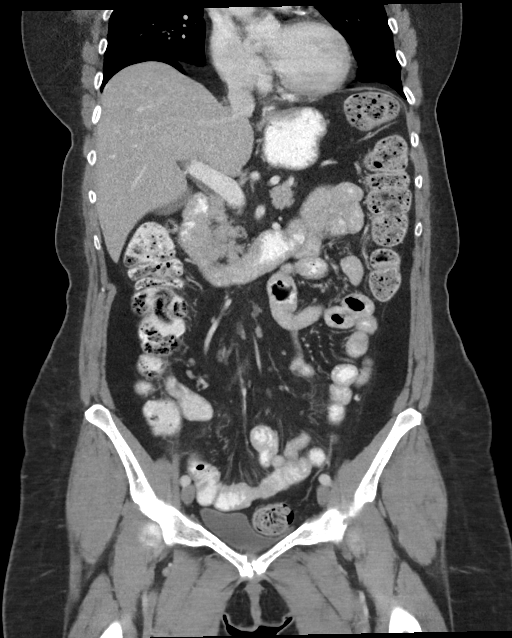
[im 52/94  soft-tissue]
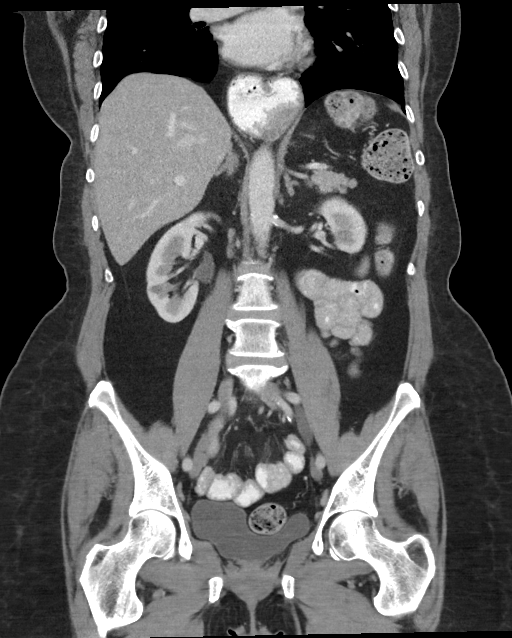

[16 of 46 positions shown; findings below may reference images not displayed]

RADIATION DOSE REDUCTION: This exam was performed according to the
departmental dose-optimization program which includes automated
exposure control, adjustment of the mA and/or kV according to
patient size and/or use of iterative reconstruction technique.

CONTRAST:  100mL OMNIPAQUE IOHEXOL 300 MG/ML  SOLN
FINDINGS: Lower chest: Small linear densities in the lower lung fields may
suggest minimal scarring or subsegmental atelectasis.
Augmentation/reconstruction prostheses are seen in both breasts.

Hepatobiliary: There is mild fatty infiltration in the liver. No
focal abnormality is seen in the liver. There is no dilation of bile
ducts. Gallbladder is unremarkable.

Pancreas: No focal abnormality is seen.

Spleen: Unremarkable.

Adrenals/Urinary Tract: Adrenals are unremarkable. There is no
hydronephrosis. There are no renal or ureteral stones. There is 12 x
6 mm low-density structure in the right kidney suggesting right
renal cyst. There are multiple calcifications in both sides of
pelvis, more so on the left side, most likely vascular. Urinary
bladder is not distended.

Stomach/Bowel: There is moderate sized fixed hiatal hernia. There is
oral contrast in the lumen of lower thoracic esophagus suggesting
gastroesophageal reflux. Small bowel loops are not dilated. Appendix
is not seen. There is no pericecal inflammation. There is no
significant wall thickening in the colon. There is no pericolic
stranding.

Vascular/Lymphatic: Scattered arterial calcifications are seen.

Reproductive: Uterus is not seen. There are no dominant adnexal
masses.

Other: There is no ascites or pneumoperitoneum. Small umbilical
hernia containing fat is seen. Small inguinal hernias containing fat
are noted, larger on the left side.

Musculoskeletal: Unremarkable.
IMPRESSION: There is no evidence of intestinal obstruction or pneumoperitoneum.
There is no hydronephrosis.

Moderate sized hiatal hernia is seen. There is contrast in the lumen
of lower thoracic esophagus suggesting gastroesophageal reflux.

Other findings as described in the body of the report.

## 2024-01-06 ENCOUNTER — Ambulatory Visit: Admitting: Internal Medicine

## 2024-01-06 ENCOUNTER — Encounter: Payer: Self-pay | Admitting: Internal Medicine

## 2024-01-06 VITALS — BP 120/68 | HR 74 | Ht 66.0 in | Wt 173.2 lb

## 2024-01-06 DIAGNOSIS — Z7984 Long term (current) use of oral hypoglycemic drugs: Secondary | ICD-10-CM | POA: Diagnosis not present

## 2024-01-06 DIAGNOSIS — E663 Overweight: Secondary | ICD-10-CM

## 2024-01-06 DIAGNOSIS — E119 Type 2 diabetes mellitus without complications: Secondary | ICD-10-CM | POA: Diagnosis not present

## 2024-01-06 DIAGNOSIS — E063 Autoimmune thyroiditis: Secondary | ICD-10-CM | POA: Diagnosis not present

## 2024-01-06 LAB — POCT GLYCOSYLATED HEMOGLOBIN (HGB A1C): Hemoglobin A1C: 6.4 % — AB (ref 4.0–5.6)

## 2024-01-06 NOTE — Progress Notes (Signed)
 Patient ID: Rebecca Kramer, female   DOB: [birth year], 78 y.o.   MRN: [id number]   HPI  Rebecca Kramer is a 78 y.o.-year-old female, initially referred by her PCP, Dr.Tower, presenting for f/u for Hashimoto hypothyroidism and DM2, non-insulin-dependent, with complications (Aortic atherosclerosis). She was seeing Dr Adolm [facility]) >> stopped seeing her 2/2 cost.  Last visit 4 months ago.  Interim history: No increased urination, blurry vision, nausea, chest pain.  She continues to not be able to eat large meals due to her hiatal hernia despite the fact that she has had surgeries for this.  Hypothyroidism: - dx'ed many years ago  She tried: -Generic levothyroxine  -Brand names -Then, Naturethroid 81.5 mg + Cytomel 5 mg bid (equivalent to ~180 mcg LT4)  -then, generic levothyroxine  -Changed to brand name Synthroid  11/2019 >> but generic LT4 since 08/2020 per insurance preference.  She is on 100 mcg LT4 daily (dose increased 06/2023) - chewing it: - in am - fasting - coffee + artificial sweetener + creamer - at least 30 min from b'fast - + calcium within >4 hours from levothyroxine  - no iron - + multivitamins >4 hours of levothyroxine   -  stopped; on Pepcid prn - 2x a day - first dose 2h later (does this every am) - not on Biotin   Reviewed TFTs: 12/22/2023: TSH 4.182 (0.45-5.33) 08/03/2023: TSH 3.161  06/09/2023: TSH 7.7 Lab Results  Component Value Date   TSH 5.80 (H) 05/06/2023   TSH 2.69 02/18/2023   TSH 24.64 (H) 01/01/2023   TSH 1.37 04/14/2022   TSH 1.68 07/21/2018   TSH 1.24 08/19/2017   TSH 1.52 04/28/2016   TSH 2.02 01/16/2016   TSH 1.97 12/05/2015   TSH 1.27 11/19/2015   FREET4 0.99 05/06/2023   FREET4 0.84 02/18/2023   FREET4 0.58 (L) 01/01/2023   FREET4 1.13 04/14/2022   FREET4 1.02 07/21/2018   FREET4 0.99 08/19/2017   FREET4 0.88 04/28/2016   FREET4 0.86 01/16/2016   FREET4 1.02 11/19/2015   FREET4 1.33 01/10/2015  12/02/2022: TSH  0.229 08/27/2022: TSH 1.533 11/24/2021: TSH 0.861 05/27/2021: TSH 2.251 11/20/2020: TSH 1.615 06/03/2020: TSH 1.554 08/18/2019: TSH 0.951 11/15/2018: 1.086 05/05/2017: TSH 1.233 total T4 9.4 11/02/2016: TSH 0.435, total T4 11.6  03/14/2014: ATA 313 (<2) TPO Abs 39 (<9) TSH 3.552 (0.35-4.5), free T4 0.82 (0.8-1.8), Free T3 2.6 (2.3-4.2), reverse T3 12 (8-25) At the same time, she hadn't hemoglobin A1c 6.3, fasting insulin 16.6, vitamin D  66  Pt denies: - feeling nodules in neck - hoarseness - dysphagia - choking  No FH of thyroid  disease or thyroid  cancer. No h/o radiation tx to head or neck. No herbal supplements. No Biotin use. No recent po steroids use - but on steroid drops.  She was exercising at [employer] but not during the coronavirus pandemic  DM2: -Diagnosed in 2016  She had acid reflux before and was on PPIs.  She had fundoplication 05/2022 and she changed her diet afterwards.  She came off PPIs.  However, she was admitted 11/01/2022 with gastric outlet obstruction, dehydration, hiatal hernia. At that time, she developed nausea with food, then water. She had to have repeated surgery 11/2022.  She still has problems tolerating her food, but especially larger meals.  Reviewed HbA1c levels: 12/22/2023: HbA1c 6.8% Lab Results  Component Value Date   HGBA1C 6.4 (A) 09/06/2023   HGBA1C 6.3 05/06/2023   HGBA1C 6.6 (A) 03/03/2022   HGBA1C 6.7 (A) 09/02/2021   HGBA1C 6.1 (A) 03/03/2021  HGBA1C 6.8 (A) 09/03/2020   HGBA1C 6.3 (A) 04/30/2020   HGBA1C 6.7 (A) 07/25/2019   HGBA1C 6.4 (A) 03/28/2019   HGBA1C 6.5 08/28/2016  06/09/2023: HbA1c 6.9% 05/06/2023: HbA1c 6.3% 12/02/2022: HbA1c 6.9% 08/27/2022: HbA1c 7.3% 11/15/2018: HbA1c 6.8% 05/17/2018: HbA1c 6.9%  She is on: - Metformin  ER 2000 >> 1000 mg with D She was previously on Farxiga  5 mg before breakfast >> moved before dinner >> off after hospitalization - admitted 11/01/2022 with gastric outlet obstruction,  dehydration, hiatal hernia.  She checks sugars once a day: - am: 110-133, 158 >> 116, 126-156, 163 >> 119-142, 151, 164 - 2h after b'fast: n/c >> 131 >> n/c >> 108 >> n/c - lunch: 128 >> 116 >> 98, 120, 131 >> n/c - 2h after lunch: 175-185 >> n/c >> 79, 123 >> n/c >> 112 - dinner: 115-112 >> 115>> n/c >> 111 >> 148 >> n/c - 2h after dinner: 192, 240 >> n/c >> 134 >> n/c >> 125 >> n/c - bedtime: 104 >> n/c Lowest: 79 >> 125 >> 111 >> 92 >> 110 >> 116 >> 112 Highest: 240 >> .[email or web address] >> 153 >> 158 >> 163 >> 173 (dessert)  She and her husband started walking after the holidays-walking 30 to 40 minutes a day. Started back walking.  No CKD: 12/24/2023: 15/0.8, Glu 75, Glu 144 Lab Results  Component Value Date   BUN 33 (H) 11/01/2022   BUN 20 08/28/2016   Lab Results  Component Value Date   CREATININE 1.13 (H) 11/01/2022   CREATININE 1.00 11/03/2021   No results found for: MICRALBCREAT On losartan  100 mg daily.  + HL: 12/22/2023: 211/160/44.3/135 Lab Results  Component Value Date   CHOL 195 08/28/2016   HDL 51.30 08/28/2016   LDLCALC 118 (H) 08/28/2016   LDLDIRECT 129.6 07/17/2011   TRIG 130.0 08/28/2016   CHOLHDL 4 08/28/2016  She declined statins due to pill burden.  Last eye exam: 04-11/2023: No DR.  She had cataracts >> had surgery 05/2020.    Last foot exam 01/01/2023, here in clinic.  She has sleep apnea - has a CPAP - does not like the machine.  ROS: + see HPI  I reviewed pt's medications, allergies, PMH, social hx, family hx, and changes were documented in the history of present illness. Otherwise, unchanged from my initial visit note.  Past Medical History:  Diagnosis Date   Arthritis    Barrett's esophagus    Bone spur    Left foot / posterior   Diabetes mellitus without complication (HCC)    Diverticulosis    GERD (gastroesophageal reflux disease)    esophagitis with ulceration   Hypertension    Hypothyroidism    Menopausal symptoms    hot flashes    Osteopenia    Pure hypercholesterolemia    Sleep apnea 2007   AHI 45/hr in 2007   Past Surgical History:  Procedure Laterality Date   ABDOMINAL HYSTERECTOMY  1983   partial/endometriosis   BREAST EXCISIONAL BIOPSY Right    BREAST SURGERY  1970's   breast lumpectomy/benign   BREAST SURGERY  1994   breast implants/saline   CATARACT EXTRACTION     COLONOSCOPY WITH PROPOFOL  N/A 04/20/2017   Procedure: COLONOSCOPY WITH PROPOFOL ;  Surgeon: Gaylyn Gladis PENNER, MD;  Location: [facility] ENDOSCOPY;  Service: Endoscopy;  Laterality: N/A;   COLONOSCOPY WITH PROPOFOL  N/A 06/17/2017   Procedure: COLONOSCOPY WITH PROPOFOL ;  Surgeon: Gaylyn Gladis PENNER, MD;  Location: [facility] ENDOSCOPY;  Service: Endoscopy;  Laterality:  N/A;   COLONOSCOPY WITH PROPOFOL  N/A 01/16/2022   Procedure: COLONOSCOPY WITH PROPOFOL ;  Surgeon: Maryruth Ole DASEN, MD;  Location: [facility] ENDOSCOPY;  Service: Endoscopy;  Laterality: N/A;  DM   DORSAL COMPARTMENT RELEASE  05/14/2011   Procedure: RELEASE DORSAL COMPARTMENT (DEQUERVAIN);  Surgeon: Lamar LULLA Leonor Mickey., MD;  Location: [facility];  Service: Orthopedics;  Laterality: Right;  right release dorsal compartment/ de quervain release with debridement of mucoid cyst   ESOPHAGOGASTRIC FUNDOPLICATION  05/2022   ESOPHAGOGASTRODUODENOSCOPY  03/2008   esophagitis with ulcerations/?barretts   ESOPHAGOGASTRODUODENOSCOPY (EGD) WITH PROPOFOL  N/A 04/20/2017   Procedure: ESOPHAGOGASTRODUODENOSCOPY (EGD) WITH PROPOFOL ;  Surgeon: Gaylyn Gladis PENNER, MD;  Location: [facility] ENDOSCOPY;  Service: Endoscopy;  Laterality: N/A;   ESOPHAGOGASTRODUODENOSCOPY (EGD) WITH PROPOFOL   06/17/2017   Procedure: ESOPHAGOGASTRODUODENOSCOPY (EGD) WITH PROPOFOL ;  Surgeon: Gaylyn Gladis PENNER, MD;  Location: [facility] ENDOSCOPY;  Service: Endoscopy;;   ESOPHAGOGASTRODUODENOSCOPY (EGD) WITH PROPOFOL  N/A 01/16/2022   Procedure: ESOPHAGOGASTRODUODENOSCOPY (EGD) WITH PROPOFOL ;  Surgeon: Maryruth Ole DASEN, MD;   Location: [facility] ENDOSCOPY;  Service: Endoscopy;  Laterality: N/A;   ESOPHAGOGASTRODUODENOSCOPY (EGD) WITH PROPOFOL  N/A 04/24/2022   Procedure: ESOPHAGOGASTRODUODENOSCOPY (EGD) WITH PROPOFOL ;  Surgeon: Maryruth Ole DASEN, MD;  Location: [facility] ENDOSCOPY;  Service: Endoscopy;  Laterality: N/A;   EYE SURGERY     thumb nodule     removed   TONSILLECTOMY     History   Social History   Marital Status: Married    Spouse Name: N/A    Number of Children: 2   Occupational History   retired   Social History Main Topics   Smoking status: Never Smoker    Smokeless tobacco: Never Used   Alcohol Use: No   Drug Use: No   Current Outpatient Medications on File Prior to Visit  Medication Sig Dispense Refill   amLODipine (NORVASC) 5 MG tablet      calcium-vitamin D  (OSCAL WITH D) 500-200 MG-UNIT tablet Take 1 tablet by mouth.     dutasteride  (AVODART ) 0.5 MG capsule Take 1 capsule (0.5 mg total) by mouth daily. 30 capsule 5   levothyroxine  (SYNTHROID ) 100 MCG tablet Take 1 tablet (100 mcg total) by mouth daily. 90 tablet 3   losartan  (COZAAR ) 50 MG tablet Take 1 tablet (50 mg total) by mouth daily. (Patient taking differently: Take 50 mg by mouth 2 times daily at 12 noon and 4 pm.) 90 tablet 1   metFORMIN  (GLUCOPHAGE -XR) 500 MG 24 hr tablet Take 2 tablets (1,000 mg total) by mouth daily with supper.     Multiple Vitamin (MULTIVITAMIN) tablet Take 1 tablet by mouth daily.     No current facility-administered medications on file prior to visit.   Allergies  Allergen Reactions   Benzonatate      Hot flash   Dilaudid [Hydromorphone Hcl] Other (See Comments)    Face turned red; bp   Family History  Problem Relation Age of Onset   Hypertension Mother    Hyperlipidemia Mother    Colon polyps Mother    Heart disease Mother    Migraines Mother    Cancer Father        lung CA   Migraines Brother    Cancer Maternal Aunt        breast CA   Breast cancer Maternal Aunt    PE: BP 120/68   Pulse  74   Ht 5' 6 (1.676 m)   Wt 173 lb 3.2 oz (78.6 kg)   SpO2 96%   BMI 27.96  kg/m  Wt Readings from Last 3 Encounters:  01/06/24 173 lb 3.2 oz (78.6 kg)  09/06/23 169 lb 6.4 oz (76.8 kg)  05/06/23 168 lb 12.8 oz (76.6 kg)   Constitutional: overweight, in NAD Eyes: no exophthalmos ENT: no masses palpated in neck, no cervical lymphadenopathy Cardiovascular: RRR, No MRG Respiratory: CTA B Musculoskeletal: no deformities Skin: no rashes Neurological: no tremor with outstretched hands Diabetic Foot Exam - Simple   Simple Foot Form Diabetic Foot exam was performed with the following findings: Yes 01/06/2024 10:42 AM  Visual Inspection No deformities, no ulcerations, no other skin breakdown bilaterally: Yes Sensation Testing Intact to touch and monofilament testing bilaterally: Yes Pulse Check Posterior Tibialis and Dorsalis pulse intact bilaterally: Yes Comments    ASSESSMENT: 1. Hypothyroidism - Due to Hashimoto's thyroiditis  2. DM2, non-insulin-dependent - Ao ATS - per CT abd (11/01/2022)  3.  Overweight  PLAN:  1. Patient with longstanding Hashimoto's hypothyroidism, on levothyroxine  therapy; she was previously on Synthroid  but per insurance requirements, she was switched to levothyroxine  generic, which she tolerates well. - latest thyroid  labs reviewed with pt. >> TSH was normal in 07/2023 - she continues on LT4 100 mcg daily - pt feels good on this dose. - we discussed about taking the thyroid  hormone every day, with water, >30 minutes before breakfast, separated by >4 hours from acid reflux medications, calcium, iron, multivitamins. Pt. is taking it correctly. - will check thyroid  tests  at next visit  2.  DM2, non-insulin-dependent - Well-controlled, on oral antidiabetic regimen with metformin  only.  At last visit, HbA1c was at goal, at 6.4%, only slightly increased from 6.3% previously.  Sugars were higher in the morning, with quite a few values above target.  She was  not checking later in the day as she was forgetting.  However, whenever checking, they were at the lower end of the normal range.  Therefore, I did not change her regimen but discussed about trying to move the metformin  at bedtime to see if this would help the sugars in the morning.  Due to her previous gastric surgeries, it may be beneficial for her to crush the metformin  dose but I did not advise her to do this at last visit to see if the sugars would improve after moving it to bedtime. -At today's visit, sugars appear to be slightly better in the morning, with only occasional spikes above 140 when she has a larger meal the night before.  She is still not checking blood sugar later in the day-only has 1 value, after lunch, recently, and this was at goal, at 112. - I advised her to: Patient Instructions  Please continue: - Metformin  1000 mg at bedtime  Please continue levothyroxine  100 mcg daily.  Take the thyroid  hormone every day, with water, at least 30 minutes before breakfast, separated by at least 4 hours from: - acid reflux medications - calcium - iron - multivitamins  Please stop at the lab.  Please come back for a follow-up appointment in 6 months.  - we checked her HbA1c: 6.4% (stable) - advised to check sugars at different times of the day - 1x a day, rotating check times - advised for yearly eye exams >> she is UTD - will check an ACR today - return to clinic in 6 months  3.  Overweight - She continues metformin  which has a mild appetite suppressant effect - Previously on Farxiga  but came off (see above) - In the past I recommended a  weight management clinic but she declined - She gained 1 pound before last visit but gained 4 pounds since then  Lela Fendt, MD PhD [facility] Endocrinology

## 2024-01-06 NOTE — Patient Instructions (Addendum)
 Please continue: - Metformin  1000 mg at bedtime  Please continue levothyroxine  100 mcg daily.  Take the thyroid  hormone every day, with water, at least 30 minutes before breakfast, separated by at least 4 hours from: - acid reflux medications - calcium - iron - multivitamins  Please stop at the lab.  Please come back for a follow-up appointment in 6 months.

## 2024-01-07 ENCOUNTER — Ambulatory Visit: Payer: Self-pay | Admitting: Internal Medicine

## 2024-01-07 LAB — MICROALBUMIN / CREATININE URINE RATIO
Creatinine, Urine: 162 mg/dL (ref 20–275)
Microalb Creat Ratio: 15 mg/g{creat} (ref <30)
Microalb, Ur: 2.5 mg/dL

## 2024-05-01 ENCOUNTER — Other Ambulatory Visit: Payer: Self-pay | Admitting: Internal Medicine

## 2024-05-01 DIAGNOSIS — E119 Type 2 diabetes mellitus without complications: Secondary | ICD-10-CM

## 2024-06-23 ENCOUNTER — Other Ambulatory Visit: Payer: Self-pay | Admitting: Internal Medicine

## 2024-06-23 DIAGNOSIS — G459 Transient cerebral ischemic attack, unspecified: Secondary | ICD-10-CM

## 2024-07-11 ENCOUNTER — Ambulatory Visit: Admitting: Internal Medicine

## 2024-07-11 ENCOUNTER — Ambulatory Visit
Admission: RE | Admit: 2024-07-11 | Discharge: 2024-07-11 | Disposition: A | Discharge status: Home / Self Care | Source: Ambulatory Visit | Attending: Internal Medicine | Admitting: Internal Medicine

## 2024-07-11 DIAGNOSIS — G459 Transient cerebral ischemic attack, unspecified: Secondary | ICD-10-CM | POA: Insufficient documentation

## 2024-07-12 ENCOUNTER — Ambulatory Visit: Admitting: Internal Medicine

## 2024-07-12 ENCOUNTER — Encounter: Payer: Self-pay | Admitting: Internal Medicine

## 2024-07-12 VITALS — BP 128/80 | HR 84 | Resp 18 | Ht 66.0 in | Wt 165.8 lb

## 2024-07-12 DIAGNOSIS — E663 Overweight: Secondary | ICD-10-CM | POA: Diagnosis not present

## 2024-07-12 DIAGNOSIS — E063 Autoimmune thyroiditis: Secondary | ICD-10-CM | POA: Diagnosis not present

## 2024-07-12 DIAGNOSIS — Z6826 Body mass index (BMI) 26.0-26.9, adult: Secondary | ICD-10-CM

## 2024-07-12 DIAGNOSIS — Z7984 Long term (current) use of oral hypoglycemic drugs: Secondary | ICD-10-CM | POA: Diagnosis not present

## 2024-07-12 DIAGNOSIS — E119 Type 2 diabetes mellitus without complications: Secondary | ICD-10-CM

## 2024-07-12 MED ORDER — LEVOTHYROXINE SODIUM 100 MCG PO TABS: 100.0000 ug | ORAL_TABLET | Freq: Every day | ORAL | 3 refills | Status: AC

## 2024-07-12 NOTE — Patient Instructions (Addendum)
 Please continue: - Metformin  1000 mg at bedtime  Please continue levothyroxine  100 mcg daily.  Take the thyroid  hormone every day, with water, at least 30 minutes before breakfast, separated by at least 4 hours from: - acid reflux medications - calcium - iron - multivitamins  Please come back for a follow-up appointment in 6 months.

## 2024-07-12 NOTE — Progress Notes (Addendum)
 Patient ID: Rebecca Kramer, female   DOB: Nov 19, 1945, 79 y.o.   MRN: [id number]   HPI  [name] is a 79 y.o.-year-old female, initially referred by her PCP, Dr.Tower, presenting for f/u for Hashimoto hypothyroidism and DM2, non-insulin-dependent, with complications (Aortic atherosclerosis). She was previously seeing Dr Adolm (functional medicine) >> stopped seeing her 2/2 cost.  Last visit 6 months ago.  Interim history: No increased urination, blurry vision, nausea, chest pain.  She continues to not be able to eat large meals due to her hiatal hernia despite the fact that she has had surgeries for this.  She had her husband are starting to improve the diet now after the holidays.  She also plans to start walking.  Hypothyroidism: - dx'ed many years ago  She tried: -Generic levothyroxine  -Brand names -Then, Naturethroid 81.5 mg + Cytomel 5 mg bid (equivalent to ~180 mcg LT4)  -then, generic levothyroxine  -Changed to brand name Synthroid  11/2019 >> but generic LT4 since 08/2020 per insurance preference.  She is on 100 mcg LT4 daily (dose increased 06/2023) - chewing it: - in am - fasting - coffee + artificial sweetener + creamer - at least 30 min from b'fast - + calcium within >4 hours from levothyroxine  - no iron - + multivitamins >4 hours of levothyroxine   -  stopped; on Pepcid prn - 2x a day - first dose 2h later (does this every am) - not on Biotin   Reviewed TFTs: 06/23/2024: TSH 1.233 12/22/2023: TSH 4.182 (0.45-5.33) 08/03/2023: TSH 3.161  06/09/2023: TSH 7.7 Lab Results  Component Value Date   TSH 5.80 (H) 05/06/2023   TSH 2.69 02/18/2023   TSH 24.64 (H) 01/01/2023   TSH 1.37 04/14/2022   TSH 1.68 07/21/2018   TSH 1.24 08/19/2017   TSH 1.52 04/28/2016   TSH 2.02 01/16/2016   TSH 1.97 12/05/2015   TSH 1.27 11/19/2015   FREET4 0.99 05/06/2023   FREET4 0.84 02/18/2023   FREET4 0.58 (L) 01/01/2023   FREET4 1.13 04/14/2022   FREET4 1.02 07/21/2018    FREET4 0.99 08/19/2017   FREET4 0.88 04/28/2016   FREET4 0.86 01/16/2016   FREET4 1.02 11/19/2015   FREET4 1.33 01/10/2015  12/02/2022: TSH 0.229 08/27/2022: TSH 1.533 11/24/2021: TSH 0.861 05/27/2021: TSH 2.251 11/20/2020: TSH 1.615 06/03/2020: TSH 1.554 08/18/2019: TSH 0.951 11/15/2018: 1.086 05/05/2017: TSH 1.233 total T4 9.4 11/02/2016: TSH 0.435, total T4 11.6  03/14/2014: ATA 313 (<2) TPO Abs 39 (<9) TSH 3.552 (0.35-4.5), free T4 0.82 (0.8-1.8), Free T3 2.6 (2.3-4.2), reverse T3 12 (8-25) At the same time, she hadn't hemoglobin A1c 6.3, fasting insulin 16.6, vitamin D  66  Pt denies: - feeling nodules in neck - hoarseness - dysphagia - choking  No FH of thyroid  disease or thyroid  cancer. No h/o radiation tx to head or neck. No herbal supplements. No Biotin use. No recent po steroids use.  She was exercising at [employer] but not during the coronavirus pandemic.  Now walking.  DM2: -Diagnosed in 2016  She had acid reflux before and was on PPIs.  She had fundoplication 05/2022 and she changed her diet afterwards.  She came off PPIs.  However, she was admitted 11/01/2022 with gastric outlet obstruction, dehydration, hiatal hernia. At that time, she developed nausea with food, then water. She had to have repeated surgery 11/2022.  She still has problems tolerating her food, but especially larger meals.  Reviewed HbA1c levels: 06/23/2024: HbA1c 6.8% Lab Results  Component Value Date   HGBA1C 6.4 (A)  01/06/2024   HGBA1C 6.4 (A) 09/06/2023   HGBA1C 6.3 05/06/2023   HGBA1C 6.6 (A) 03/03/2022   HGBA1C 6.7 (A) 09/02/2021   HGBA1C 6.1 (A) 03/03/2021   HGBA1C 6.8 (A) 09/03/2020   HGBA1C 6.3 (A) 04/30/2020   HGBA1C 6.7 (A) 07/25/2019   HGBA1C 6.4 (A) 03/28/2019  12/22/2023: HbA1c 6.8% 06/09/2023: HbA1c 6.9% 05/06/2023: HbA1c 6.3% 12/02/2022: HbA1c 6.9% 08/27/2022: HbA1c 7.3% 11/15/2018: HbA1c 6.8% 05/17/2018: HbA1c 6.9%  She is on: - Metformin  ER 2000 >> 1000 mg with  D She was previously on Farxiga  5 mg before breakfast >> moved before dinner >> off after hospitalization - admitted 11/01/2022 with gastric outlet obstruction, dehydration, hiatal hernia.  She checks sugars once a day: - am:116, 126-156, 163 >> 119-142, 151, 164 >> 106-149, 162 - 2h after b'fast: n/c >> 131 >> n/c >> 108 >> n/c - lunch: 128 >> 116 >> 98, 120, 131 >> n/c >> 118 - 2h after lunch: 175-185 >> n/c >> 79, 123 >> n/c >> 112 >> 107, 126 - dinner: 115-112 >> 115>> n/c >> 111 >> 148 >> n/c - 2h after dinner: 192, 240 >> n/c >> 134 >> n/c >> 125 >> n/c - bedtime: 104 >> n/c Lowest: 79 >> ... 116 >> 112 >> 106 Highest: 240 >> ... 163 >> 173 (dessert) >> 162  Meter: [facility] now  She and her husband started walking after the holidays-walking 30 to 40 minutes a day. Started back walking.  No CKD: 06/23/2024: 16/1.1, GFR 51, glucose 116 12/24/2023: 15/0.8  Lab Results  Component Value Date   BUN 33 (H) 11/01/2022   BUN 20 08/28/2016   Lab Results  Component Value Date   CREATININE 1.13 (H) 11/01/2022   CREATININE 1.00 11/03/2021   Lab Results  Component Value Date   MICRALBCREAT 15 01/06/2024  On losartan  100 mg daily.  + HL: 06/23/2024: 222/138/50.2/144 12/22/2023: 211/160/44.3/135 Lab Results  Component Value Date   CHOL 195 08/28/2016   HDL 51.30 08/28/2016   LDLCALC 118 (H) 08/28/2016   LDLDIRECT 129.6 07/17/2011   TRIG 130.0 08/28/2016   CHOLHDL 4 08/28/2016  She declined statins due to pill burden. PCP rec'd Crestor 10 mg daily.  Last eye exam: 04-11/2023: No DR.  She had cataracts >> had surgery 05/2020.    Last foot exam 01/06/2024, here in clinic.  She has sleep apnea - has a CPAP - does not like the machine.  ROS: + see HPI  I reviewed pt's medications, allergies, PMH, social hx, family hx, and changes were documented in the history of present illness. Otherwise, unchanged from my initial visit note.  Past Medical History:  Diagnosis Date    Arthritis    Barrett's esophagus    Bone spur    Left foot / posterior   Diabetes mellitus without complication (HCC)    Diverticulosis    GERD (gastroesophageal reflux disease)    esophagitis with ulceration   Hypertension    Hypothyroidism    Menopausal symptoms    hot flashes   Osteopenia    Pure hypercholesterolemia    Sleep apnea 2007   AHI 45/hr in 2007   Past Surgical History:  Procedure Laterality Date   ABDOMINAL HYSTERECTOMY  1983   partial/endometriosis   BREAST EXCISIONAL BIOPSY Right    BREAST SURGERY  1970's   breast lumpectomy/benign   BREAST SURGERY  1994   breast implants/saline   CATARACT EXTRACTION     COLONOSCOPY WITH PROPOFOL  N/A 04/20/2017  Procedure: COLONOSCOPY WITH PROPOFOL ;  Surgeon: Gaylyn Gladis PENNER, MD;  Location: [facility] ENDOSCOPY;  Service: Endoscopy;  Laterality: N/A;   COLONOSCOPY WITH PROPOFOL  N/A 06/17/2017   Procedure: COLONOSCOPY WITH PROPOFOL ;  Surgeon: Gaylyn Gladis PENNER, MD;  Location: [facility] ENDOSCOPY;  Service: Endoscopy;  Laterality: N/A;   COLONOSCOPY WITH PROPOFOL  N/A 01/16/2022   Procedure: COLONOSCOPY WITH PROPOFOL ;  Surgeon: Maryruth Ole DASEN, MD;  Location: [facility] ENDOSCOPY;  Service: Endoscopy;  Laterality: N/A;  DM   DORSAL COMPARTMENT RELEASE  05/14/2011   Procedure: RELEASE DORSAL COMPARTMENT (DEQUERVAIN);  Surgeon: Lamar LULLA Leonor Mickey., MD;  Location: [facility];  Service: Orthopedics;  Laterality: Right;  right release dorsal compartment/ de quervain release with debridement of mucoid cyst   ESOPHAGOGASTRIC FUNDOPLICATION  05/2022   ESOPHAGOGASTRODUODENOSCOPY  03/2008   esophagitis with ulcerations/?barretts   ESOPHAGOGASTRODUODENOSCOPY (EGD) WITH PROPOFOL  N/A 04/20/2017   Procedure: ESOPHAGOGASTRODUODENOSCOPY (EGD) WITH PROPOFOL ;  Surgeon: Gaylyn Gladis PENNER, MD;  Location: [facility] ENDOSCOPY;  Service: Endoscopy;  Laterality: N/A;   ESOPHAGOGASTRODUODENOSCOPY (EGD) WITH PROPOFOL   06/17/2017   Procedure:  ESOPHAGOGASTRODUODENOSCOPY (EGD) WITH PROPOFOL ;  Surgeon: Gaylyn Gladis PENNER, MD;  Location: [facility] ENDOSCOPY;  Service: Endoscopy;;   ESOPHAGOGASTRODUODENOSCOPY (EGD) WITH PROPOFOL  N/A 01/16/2022   Procedure: ESOPHAGOGASTRODUODENOSCOPY (EGD) WITH PROPOFOL ;  Surgeon: Maryruth Ole DASEN, MD;  Location: [facility] ENDOSCOPY;  Service: Endoscopy;  Laterality: N/A;   ESOPHAGOGASTRODUODENOSCOPY (EGD) WITH PROPOFOL  N/A 04/24/2022   Procedure: ESOPHAGOGASTRODUODENOSCOPY (EGD) WITH PROPOFOL ;  Surgeon: Maryruth Ole DASEN, MD;  Location: [facility] ENDOSCOPY;  Service: Endoscopy;  Laterality: N/A;   EYE SURGERY     thumb nodule     removed   TONSILLECTOMY     History   Social History   Marital Status: Married    Spouse Name: N/A    Number of Children: 2   Occupational History   retired   Social History Main Topics   Smoking status: Never Smoker    Smokeless tobacco: Never Used   Alcohol Use: No   Drug Use: No   Current Outpatient Medications on File Prior to Visit  Medication Sig Dispense Refill   amLODipine (NORVASC) 5 MG tablet      calcium-vitamin D  (OSCAL WITH D) 500-200 MG-UNIT tablet Take 1 tablet by mouth.     levothyroxine  (SYNTHROID ) 100 MCG tablet Take 1 tablet (100 mcg total) by mouth daily. 90 tablet 3   losartan  (COZAAR ) 50 MG tablet Take 1 tablet (50 mg total) by mouth daily. (Patient taking differently: Take 50 mg by mouth 2 times daily at 12 noon and 4 pm.) 90 tablet 1   metFORMIN  (GLUCOPHAGE -XR) 500 MG 24 hr tablet Take 2 tablets (1,000 mg total) by mouth daily with supper. 180 tablet 1   Multiple Vitamin (MULTIVITAMIN) tablet Take 1 tablet by mouth daily.     No current facility-administered medications on file prior to visit.   Allergies  Allergen Reactions   Benzonatate      Hot flash   Dilaudid [Hydromorphone Hcl] Other (See Comments)    Face turned red; bp   Family History  Problem Relation Age of Onset   Hypertension Mother    Hyperlipidemia Mother    Colon polyps  Mother    Heart disease Mother    Migraines Mother    Cancer Father        lung CA   Migraines Brother    Cancer Maternal Aunt        breast CA   Breast cancer Maternal Aunt    PE:  BP 128/80   Pulse 84   Resp 18   Ht 5' 6 (1.676 m)   Wt 165 lb 12.8 oz (75.2 kg)   SpO2 98%   BMI 26.76 kg/m  Wt Readings from Last 3 Encounters:  07/12/24 165 lb 12.8 oz (75.2 kg)  01/06/24 173 lb 3.2 oz (78.6 kg)  09/06/23 169 lb 6.4 oz (76.8 kg)   Constitutional: overweight, in NAD Eyes: no exophthalmos ENT: no masses palpated in neck, no cervical lymphadenopathy Cardiovascular: RRR, No MRG Respiratory: CTA B Musculoskeletal: no deformities Skin: no rashes Neurological: no tremor with outstretched hands  ASSESSMENT: 1. Hypothyroidism - Due to Hashimoto's thyroiditis  2. DM2, non-insulin-dependent - Ao ATS - per CT abd (11/01/2022)  3.  Overweight  PLAN:  1. Patient with longstanding Hashimoto's hypothyroidism, on levothyroxine  therapy; she was previously on Synthroid  but per insurance requirements, she was switched to levothyroxine  generic, which she tolerates well. - latest thyroid  labs reviewed with pt. >> normal in 06/2024 (1.233) - she continues on LT4 100 mcg daily - pt feels good on this dose. - we discussed about taking the thyroid  hormone every day, with water, >30 minutes before breakfast, separated by >4 hours from acid reflux medications, calcium, iron, multivitamins. Pt. is taking it correctly.  - I refilled her levothyroxine  prescription  2.  DM2, non-insulin-dependent -Well-controlled, on a minimalistic regimen with metformin  only.  At last visit, HbA1c was 6.4%, but she had a more recent HbA1c obtained 06/23/2024 and this was higher, at 6.8%.  This is still at goal for age. -At last visit, sugars were slightly better in the morning, with only occasional spikes above 140 when she had a larger meal the night before.  She was still not checking blood sugars later in the  day, she only had 1 value, after lunch, and this was at goal, at 112. -At today's visit, sugars are mostly at goal, but with occasional hyperglycemic values in the morning, now during the holidays.  She is working on improving her diet and restarting walking with her husband.  For now, especially since her HbA1c was at goal recently, I did not suggest a change in regimen.  She is now in the [facility] program and she has a new meter.  They are also making diet suggestions. - At today's visit, she had questions about a statin, which was recently suggested by PCP/Crestor 10 mg daily.  I strongly advised her to start this, especially as her LDL cholesterol is significantly above our target of less than 55 due to aortic atherosclerosis.  She was not aware of this and I explained that this was seen on her CT scan.  We discussed about how to take this correctly. - I advised her to: Patient Instructions  Please continue: - Metformin  1000 mg at bedtime  Please continue levothyroxine  100 mcg daily. We Take the thyroid  hormone every day, with water, at least 30 minutes before breakfast, separated by at least 4 hours from: - acid reflux medications - calcium - iron - multivitamins  Please come back for a follow-up appointment in 6 months.  - advised to check sugars at different times of the day - 1x a day, rotating check times - advised for yearly eye exams >> she is UTD - return to clinic in 6 months  3.  Overweight - She continues on metformin  which has a mild appetite suppressant effect - She was previously on Farxiga  but came off (see above) - In the past I  recommended a weight management clinic but she declined - She gained 4 pounds before last visit, but lost 7 since then.  Requested Prescriptions   Signed Prescriptions Disp Refills   levothyroxine  (SYNTHROID ) 100 MCG tablet 90 tablet 3    Sig: Take 1 tablet (100 mcg total) by mouth daily.   Lela Fendt, MD PhD [facility]

## 2025-01-09 ENCOUNTER — Ambulatory Visit: Admitting: Internal Medicine
